# Patient Record
Sex: Female | Born: 1950 | Race: White | Hispanic: No | Marital: Single | Smoking: Never smoker
Health system: Southern US, Community
[De-identification: ages and names within clinical notes are randomized; demographics above are authoritative.]

## PROBLEM LIST (undated history)

## (undated) ENCOUNTER — Ambulatory Visit: Admission: EM | Payer: PPO | Source: Home / Self Care

## (undated) DIAGNOSIS — E785 Hyperlipidemia, unspecified: Secondary | ICD-10-CM

## (undated) DIAGNOSIS — N200 Calculus of kidney: Secondary | ICD-10-CM

## (undated) DIAGNOSIS — R7303 Prediabetes: Secondary | ICD-10-CM

## (undated) DIAGNOSIS — I10 Essential (primary) hypertension: Secondary | ICD-10-CM

## (undated) HISTORY — DX: Hyperlipidemia, unspecified: E78.5

## (undated) HISTORY — DX: Essential (primary) hypertension: I10

## (undated) HISTORY — DX: Prediabetes: R73.03

## (undated) HISTORY — PX: LITHOTRIPSY: SUR834

## (undated) HISTORY — PX: TUBAL LIGATION: SHX77

---

## 2000-10-17 ENCOUNTER — Emergency Department (HOSPITAL_COMMUNITY): Admission: EM | Admit: 2000-10-17 | Discharge: 2000-10-17 | Payer: Self-pay | Admitting: Emergency Medicine

## 2000-10-17 ENCOUNTER — Encounter: Payer: Self-pay | Admitting: Emergency Medicine

## 2000-10-20 ENCOUNTER — Encounter: Payer: Self-pay | Admitting: Urology

## 2000-10-20 ENCOUNTER — Ambulatory Visit (HOSPITAL_COMMUNITY): Admission: RE | Admit: 2000-10-20 | Discharge: 2000-10-20 | Payer: Self-pay | Admitting: Urology

## 2001-10-02 ENCOUNTER — Encounter: Payer: Self-pay | Admitting: Urology

## 2001-10-02 ENCOUNTER — Ambulatory Visit (HOSPITAL_COMMUNITY): Admission: RE | Admit: 2001-10-02 | Discharge: 2001-10-02 | Payer: Self-pay | Admitting: Urology

## 2002-03-20 ENCOUNTER — Observation Stay (HOSPITAL_COMMUNITY): Admission: RE | Admit: 2002-03-20 | Discharge: 2002-03-21 | Payer: Self-pay | Admitting: General Surgery

## 2005-02-04 HISTORY — PX: COLONOSCOPY: SHX174

## 2005-02-04 HISTORY — PX: ESOPHAGOGASTRODUODENOSCOPY: SHX1529

## 2012-08-25 ENCOUNTER — Other Ambulatory Visit: Payer: Self-pay | Admitting: Family Medicine

## 2012-08-25 LAB — BASIC METABOLIC PANEL
BUN: 16 mg/dL (ref 6–23)
CO2: 28 mEq/L (ref 19–32)
Calcium: 9.4 mg/dL (ref 8.4–10.5)
Chloride: 108 mEq/L (ref 96–112)
Creat: 0.83 mg/dL (ref 0.50–1.10)
Glucose, Bld: 99 mg/dL (ref 70–99)
Potassium: 4.1 mEq/L (ref 3.5–5.3)
Sodium: 143 mEq/L (ref 135–145)

## 2012-09-20 ENCOUNTER — Encounter: Payer: Self-pay | Admitting: *Deleted

## 2012-09-21 ENCOUNTER — Encounter: Payer: Self-pay | Admitting: *Deleted

## 2012-09-26 ENCOUNTER — Ambulatory Visit (INDEPENDENT_AMBULATORY_CARE_PROVIDER_SITE_OTHER): Payer: PRIVATE HEALTH INSURANCE | Admitting: Family Medicine

## 2012-09-26 ENCOUNTER — Encounter: Payer: Self-pay | Admitting: Family Medicine

## 2012-09-26 VITALS — BP 146/92 | HR 70 | Wt 204.2 lb

## 2012-09-26 DIAGNOSIS — R7309 Other abnormal glucose: Secondary | ICD-10-CM

## 2012-09-26 DIAGNOSIS — R7303 Prediabetes: Secondary | ICD-10-CM | POA: Insufficient documentation

## 2012-09-26 DIAGNOSIS — I10 Essential (primary) hypertension: Secondary | ICD-10-CM | POA: Insufficient documentation

## 2012-09-26 DIAGNOSIS — E785 Hyperlipidemia, unspecified: Secondary | ICD-10-CM | POA: Insufficient documentation

## 2012-09-26 MED ORDER — LOSARTAN POTASSIUM 50 MG PO TABS: 50.0000 mg | ORAL_TABLET | Freq: Every day | ORAL | Status: DC

## 2012-09-26 NOTE — Patient Instructions (Addendum)
Minimize starches and sugars Walking on your days off Do labs in the fall.  Follow up later in the fall   Diabetes Meal Planning Guide The diabetes meal planning guide is a tool to help you plan your meals and snacks. It is important for people with diabetes to manage their blood glucose (sugar) levels. Choosing the right foods and the right amounts throughout your day will help control your blood glucose. Eating right can even help you improve your blood pressure and reach or maintain a healthy weight. CARBOHYDRATE COUNTING MADE EASY When you eat carbohydrates, they turn to sugar. This raises your blood glucose level. Counting carbohydrates can help you control this level so you feel better. When you plan your meals by counting carbohydrates, you can have more flexibility in what you eat and balance your medicine with your food intake. Carbohydrate counting simply means adding up the total amount of carbohydrate grams in your meals and snacks. Try to eat about the same amount at each meal. Foods with carbohydrates are listed below. Each portion below is 1 carbohydrate serving or 15 grams of carbohydrates. Ask your dietician how many grams of carbohydrates you should eat at each meal or snack. Grains and Starches  1 slice bread.   English muffin or hotdog/hamburger bun.   cup cold cereal (unsweetened).   cup cooked pasta or rice.   cup starchy vegetables (corn, potatoes, peas, beans, winter squash).  1 tortilla (6 inches).   bagel.  1 waffle or pancake (size of a CD).   cup cooked cereal.  4 to 6 small crackers. *Whole grain is recommended. Fruit  1 cup fresh unsweetened berries, melon, papaya, pineapple.  1 small fresh fruit.   banana or mango.   cup fruit juice (4 oz unsweetened).   cup canned fruit in natural juice or water.  2 tbs dried fruit.  12 to 15 grapes or cherries. Milk and Yogurt  1 cup fat-free or 1% milk.  1 cup soy milk.  6 oz light yogurt  with sugar-free sweetener.  6 oz low-fat soy yogurt.  6 oz plain yogurt. Vegetables  1 cup raw or  cup cooked is counted as 0 carbohydrates or a "free" food.  If you eat 3 or more servings at 1 meal, count them as 1 carbohydrate serving. Other Carbohydrates   oz chips or pretzels.   cup ice cream or frozen yogurt.   cup sherbet or sorbet.  2 inch square cake, no frosting.  1 tbs honey, sugar, jam, jelly, or syrup.  2 small cookies.  3 squares of graham crackers.  3 cups popcorn.  6 crackers.  1 cup broth-based soup.  Count 1 cup casserole or other mixed foods as 2 carbohydrate servings.  Foods with less than 20 calories in a serving may be counted as 0 carbohydrates or a "free" food. You may want to purchase a book or computer software that lists the carbohydrate gram counts of different foods. In addition, the nutrition facts panel on the labels of the foods you eat are a good source of this information. The label will tell you how big the serving size is and the total number of carbohydrate grams you will be eating per serving. Divide this number by 15 to obtain the number of carbohydrate servings in a portion. Remember, 1 carbohydrate serving equals 15 grams of carbohydrate. SERVING SIZES Measuring foods and serving sizes helps you make sure you are getting the right amount of food. The list below  tells how big or small some common serving sizes are.  1 oz.........4 stacked dice.  3 oz........Marland KitchenDeck of cards.  1 tsp.......Marland KitchenTip of little finger.  1 tbs......Marland KitchenMarland KitchenThumb.  2 tbs.......Marland KitchenGolf ball.   cup......Marland KitchenHalf of a fist.  1 cup.......Marland KitchenA fist. SAMPLE DIABETES MEAL PLAN Below is a sample meal plan that includes foods from the grain and starches, dairy, vegetable, fruit, and meat groups. A dietician can individualize a meal plan to fit your calorie needs and tell you the number of servings needed from each food group. However, controlling the total amount of  carbohydrates in your meal or snack is more important than making sure you include all of the food groups at every meal. You may interchange carbohydrate containing foods (dairy, starches, and fruits). The meal plan below is an example of a 2000 calorie diet using carbohydrate counting. This meal plan has 17 carbohydrate servings. Breakfast  1 cup oatmeal (2 carb servings).   cup light yogurt (1 carb serving).  1 cup blueberries (1 carb serving).   cup almonds. Snack  1 large apple (2 carb servings).  1 low-fat string cheese stick. Lunch  Chicken breast salad.  1 cup spinach.   cup chopped tomatoes.  2 oz chicken breast, sliced.  2 tbs low-fat [place] dressing.  12 whole-wheat crackers (2 carb servings).  12 to 15 grapes (1 carb serving).  1 cup low-fat milk (1 carb serving). Snack  1 cup carrots.   cup hummus (1 carb serving). Dinner  3 oz broiled salmon.  1 cup brown rice (3 carb servings). Snack  1  cups steamed broccoli (1 carb serving) drizzled with 1 tsp olive oil and lemon juice.  1 cup light pudding (2 carb servings). DIABETES MEAL PLANNING WORKSHEET Your dietician can use this worksheet to help you decide how many servings of foods and what types of foods are right for you.  BREAKFAST Food Group and Servings / Carb Servings Grain/Starches __________________________________ Dairy __________________________________________ Vegetable ______________________________________ Fruit ___________________________________________ Meat __________________________________________ Fat ____________________________________________ LUNCH Food Group and Servings / Carb Servings Grain/Starches ___________________________________ Dairy ___________________________________________ Fruit ____________________________________________ Meat ___________________________________________ Fat _____________________________________________ [name] Food Group and Servings /  Carb Servings Grain/Starches ___________________________________ Dairy ___________________________________________ Fruit ____________________________________________ Meat ___________________________________________ Fat _____________________________________________ SNACKS Food Group and Servings / Carb Servings Grain/Starches ___________________________________ Dairy ___________________________________________ Vegetable _______________________________________ Fruit ____________________________________________ Meat ___________________________________________ Fat _____________________________________________ DAILY TOTALS Starches _________________________ Vegetable ________________________ Fruit ____________________________ Dairy ____________________________ Meat ____________________________ Fat ______________________________ Document Released: 02/17/2005 Document Revised: 08/15/2011 Document Reviewed: 12/29/2008 [employer] Patient Information 2013 [place], [place].

## 2012-09-26 NOTE — Progress Notes (Signed)
  Subjective:    Patient ID: Patricia Fuller, female    DOB: 1951/04/09, 62 y.o.   MRN: [id number]  Hypertension This is a chronic problem. The current episode started more than 1 year ago. The problem is unchanged. The problem is controlled. Pertinent negatives include no chest pain, headaches, malaise/fatigue, peripheral edema, PND or shortness of breath. There are no associated agents to hypertension. Risk factors for coronary artery disease include dyslipidemia, family history and sedentary lifestyle. Past treatments include angiotensin blockers. The current treatment provides significant improvement. There are no compliance problems.  There is no history of angina.      Review of Systems  Constitutional: Negative for malaise/fatigue.  Respiratory: Negative for shortness of breath.   Cardiovascular: Negative for chest pain and PND.  Neurological: Negative for headaches.       Objective:   Physical Exam  Her lungs are clear hearts regular pulse normal extremities no edema vital signs are noted. Blood pressure retaken was normal.      Assessment & Plan:  HTN-refills given labs recommended patient followup 6 months time later this fall sooner if any problems.

## 2013-04-05 ENCOUNTER — Encounter: Payer: Self-pay | Admitting: Nurse Practitioner

## 2013-04-05 ENCOUNTER — Ambulatory Visit (INDEPENDENT_AMBULATORY_CARE_PROVIDER_SITE_OTHER): Payer: PRIVATE HEALTH INSURANCE | Admitting: Nurse Practitioner

## 2013-04-05 VITALS — BP 138/80 | Temp 98.4°F | Ht 66.0 in | Wt 207.8 lb

## 2013-04-05 DIAGNOSIS — L0201 Cutaneous abscess of face: Secondary | ICD-10-CM

## 2013-04-05 MED ORDER — CLINDAMYCIN HCL 300 MG PO CAPS: 300.0000 mg | ORAL_CAPSULE | Freq: Three times a day (TID) | ORAL | Status: DC

## 2013-04-06 ENCOUNTER — Encounter (HOSPITAL_COMMUNITY): Payer: Self-pay | Admitting: Emergency Medicine

## 2013-04-06 ENCOUNTER — Emergency Department (HOSPITAL_COMMUNITY)
Admission: EM | Admit: 2013-04-06 | Discharge: 2013-04-06 | Disposition: A | Discharge status: Home / Self Care | Payer: PRIVATE HEALTH INSURANCE | Attending: Emergency Medicine | Admitting: Emergency Medicine

## 2013-04-06 DIAGNOSIS — R599 Enlarged lymph nodes, unspecified: Secondary | ICD-10-CM

## 2013-04-06 DIAGNOSIS — I10 Essential (primary) hypertension: Secondary | ICD-10-CM | POA: Insufficient documentation

## 2013-04-06 DIAGNOSIS — Z792 Long term (current) use of antibiotics: Secondary | ICD-10-CM | POA: Insufficient documentation

## 2013-04-06 DIAGNOSIS — Z8639 Personal history of other endocrine, nutritional and metabolic disease: Secondary | ICD-10-CM | POA: Insufficient documentation

## 2013-04-06 DIAGNOSIS — Z862 Personal history of diseases of the blood and blood-forming organs and certain disorders involving the immune mechanism: Secondary | ICD-10-CM | POA: Insufficient documentation

## 2013-04-06 DIAGNOSIS — L03211 Cellulitis of face: Secondary | ICD-10-CM | POA: Insufficient documentation

## 2013-04-06 DIAGNOSIS — Z79899 Other long term (current) drug therapy: Secondary | ICD-10-CM | POA: Insufficient documentation

## 2013-04-06 DIAGNOSIS — L0201 Cutaneous abscess of face: Secondary | ICD-10-CM

## 2013-04-06 MED ORDER — POVIDONE-IODINE 10 % EX SOLN
CUTANEOUS | Status: AC
  Administered 2013-04-06: 11:00:00
  Filled 2013-04-06: qty 118

## 2013-04-06 MED ORDER — LIDOCAINE HCL (PF) 1 % IJ SOLN
5.0000 mL | Freq: Once | INTRAMUSCULAR | Status: AC
  Administered 2013-04-06: 5 mL
  Filled 2013-04-06: qty 5

## 2013-04-06 NOTE — ED Notes (Signed)
?  abcess to left chin area for 5 days. Is on antibiotic, woke today w/ face more swollen and draining.

## 2013-04-06 NOTE — ED Provider Notes (Signed)
CSN: [id number]     Arrival date & time 04/06/13  0909 History   First MD Initiated Contact with Patient 04/06/13 0920     Chief Complaint  Patient presents with  . Wound Check   (Consider location/radiation/quality/duration/timing/severity/associated sxs/prior Treatment) Patient is a 62 y.o. female presenting with wound check. The history is provided by the patient.  Wound Check This is a new problem. The current episode started in the past 7 days. The problem has been gradually worsening. Associated symptoms include swollen glands. Pertinent negatives include no chills, fever, headaches, nausea or vomiting. Exacerbated by: pressure. She has tried acetaminophen (antibiotics) for the symptoms.   Patricia Fuller is a.62 female who presents to the ED with facial pain that started 5 days ago. She started with a pimple area on her left cheek just under her eye. That area got better and then she noticed an area on the left side of her chin. She went to her doctor and they started Clindamycin. This morning she has increased swelling and pain. The area did have a small amount of drainage.  Past Medical History  Diagnosis Date  . Hypertension   . Hyperlipidemia   . Prediabetes    Past Surgical History  Procedure Laterality Date  . Esophagogastroduodenoscopy  02/2005  . Colonoscopy  02/2005    small polyp   Family History  Problem Relation Age of Onset  . Hypertension Mother   . Diabetes Mother   . Heart disease Mother    History  Substance Use Topics  . Smoking status: Never Smoker   . Smokeless tobacco: Not on file  . Alcohol Use: No   OB History   Grav Para Term Preterm Abortions TAB SAB Ect Mult Living                 Review of Systems  Constitutional: Negative for fever and chills.  HENT: Positive for facial swelling. Negative for dental problem.   Eyes: Negative for redness.  Respiratory: Negative for shortness of breath.   Gastrointestinal: Negative for nausea and vomiting.    Musculoskeletal: Negative for back pain.  Skin: Positive for wound.  Neurological: Negative for headaches.  Psychiatric/Behavioral: The patient is not nervous/anxious.     Allergies  Review of patient's allergies indicates no known allergies.  Home Medications   Current Outpatient Rx  Name  Route  Sig  Dispense  Refill  . clindamycin (CLEOCIN) 300 MG capsule   Oral   Take 1 capsule (300 mg total) by mouth 3 (three) times daily.   21 capsule   0   . losartan (COZAAR) 50 MG tablet   Oral   Take 1 tablet (50 mg total) by mouth daily.   90 tablet   2    BP 149/69  Pulse 84  Temp(Src) 97.9 F (36.6 C) (Oral)  Resp 20  Ht 5\' 6"  (1.676 m)  Wt 207 lb (93.895 kg)  BMI 33.43 kg/m2  SpO2 100% Physical Exam  Nursing note and vitals reviewed. Constitutional: She is oriented to person, place, and time. She appears well-developed and well-nourished. No distress.  HENT:  Head:    Tender, swollen fluctuant area palpated left side of face. Submandibular node enlarged.    Eyes: Conjunctivae and EOM are normal.  Neck: Neck supple.  Cardiovascular: Normal rate.   Pulmonary/Chest: Effort normal.  Musculoskeletal: Normal range of motion.  Neurological: She is alert and oriented to person, place, and time. No cranial nerve deficit.  Skin: Skin is  warm and dry.  Psychiatric: She has a normal mood and affect. Her behavior is normal.   Dr. Rosalia Hammers examined and we will try and I&D area near jaw. ED Course  Procedures  INCISION AND DRAINAGE Performed by: NEESE,HOPE Consent: Verbal consent obtained. Risks and benefits: risks, benefits and alternatives were discussed Type: abscess  Body area: left side of face/chin  Anesthesia: local infiltration  Local anesthetic: lidocaine 1% without epinephrine  Anesthetic total: 1 ml  Incision made with # 11 blade  Drainage: bloody  Drainage amount: samll  Patient tolerance: Patient tolerated the procedure well with no immediate  complications.    MDM: Dr. Rosalia Hammers in to examine the patient.   62 y.o. female with abscess to left side of face. She is to continue warm wet compresses, antibiotics and follow up with her PCP in 2 days. If she develops fever, persistent vomiting or other problems she will return immediately to the ED. Hospitalization for IV antibiotics is not indicated at this time.     Janne Napoleon, NP 04/06/13 1327

## 2013-04-07 NOTE — ED Provider Notes (Signed)
History/physical exam/procedure(s) were performed by non-physician practitioner and as supervising physician I was immediately available for consultation/collaboration. I have reviewed all notes and am in agreement with care and plan.   Cailynn Bodnar S Desia Saban, MD 04/07/13 1559 

## 2013-04-08 ENCOUNTER — Encounter: Payer: Self-pay | Admitting: Family Medicine

## 2013-04-08 ENCOUNTER — Encounter: Payer: Self-pay | Admitting: Nurse Practitioner

## 2013-04-08 ENCOUNTER — Ambulatory Visit (INDEPENDENT_AMBULATORY_CARE_PROVIDER_SITE_OTHER): Payer: PRIVATE HEALTH INSURANCE | Admitting: Family Medicine

## 2013-04-08 VITALS — BP 138/86 | Ht 66.0 in | Wt 208.0 lb

## 2013-04-08 DIAGNOSIS — L03211 Cellulitis of face: Secondary | ICD-10-CM

## 2013-04-08 DIAGNOSIS — L0201 Cutaneous abscess of face: Secondary | ICD-10-CM

## 2013-04-08 MED ORDER — DOXYCYCLINE HYCLATE 100 MG PO CAPS: 100.0000 mg | ORAL_CAPSULE | Freq: Two times a day (BID) | ORAL | Status: DC

## 2013-04-08 MED ORDER — HYDROCODONE-ACETAMINOPHEN 5-325 MG PO TABS: 1.0000 | ORAL_TABLET | Freq: Four times a day (QID) | ORAL | Status: DC | PRN

## 2013-04-08 NOTE — Progress Notes (Signed)
  Subjective:    Patient ID: Patricia Fuller, female    DOB: 1951/01/07, 62 y.o.   MRN: [id number]  HPI Patient arrives for follow up on abscess on face. Was seen 04/05/13 and given clindamycin and noticed the swelling so went to ER Sat and  They performed an I and D and told patient to stay on Clindamycin. Patient still has som swelling in face as well as some sores around the area. She states in the ER when they drain that they did not get much drainage from it. She was rather frustrated she relates having ongoing pain and discomfort. Denies any other issues currently  Review of Systems See above no fever    Objective:   Physical Exam She has a slight swollen area left side of face she also has erythematous area scabbing where the abscess was no fluctuance felt.       Assessment & Plan:  Cellulitis with previous abscess I don't feel this patient needs soft tissue CT scan at this point I would recommend adding doxycycline twice a day warm compresses frequently home from work today followup early next week

## 2013-04-08 NOTE — Progress Notes (Signed)
Subjective:  Presents with complaints of a knot on the left side of her throat for the past 2 weeks. Headache sore on the upper left part of her face around the maxillary area about 1-2 weeks ago which resolved. Began having a newly Dum[place] on the chin 5 days ago with another lesion just beneath his last night. No fever. Rare cough. No runny nose. No headache. No sore throat. Mild left ear pain.  Objective:   BP 138/80  Temp(Src) 98.4 F (36.9 C)  Ht 5\' 6"  (1.676 m)  Wt 207 lb 12.8 oz (94.257 kg)  BMI 33.56 kg/m2 NAD. Alert, oriented. TMs minimal clear effusion, no erythema. Pharynx minimally injected with clear PND. Neck supple with moderate soft slightly tender anterior cervical adenopathy. Moderate erythema noted on the left maxillary area, left cheek is slightly swollen and tender. A 2 x 2 centimeter flat abscess noted on the left chin area near her mouth with 2 small openings, no active drainage.. A smaller lesion is noted beneath the chin.  Assessment:Cellulitis and abscess of face  Plan: No indication for I&D at this time. Recommend warm moist compresses to her face. Meds ordered this encounter  Medications  . clindamycin (CLEOCIN) 300 MG capsule    Sig: Take 1 capsule (300 mg total) by mouth 3 (three) times daily.    Dispense:  21 capsule    Refill:  0    Order Specific Question:  Supervising Provider    Answer:  Merlyn Albert [[id number]]   Patient defers pain medication at this point. Anti-inflammatories as directed. Warning signs reviewed. Call back in 72 hours if no improvement, go to ER over the weekend if worse.

## 2013-04-11 ENCOUNTER — Ambulatory Visit (INDEPENDENT_AMBULATORY_CARE_PROVIDER_SITE_OTHER): Payer: PRIVATE HEALTH INSURANCE | Admitting: Family Medicine

## 2013-04-11 ENCOUNTER — Encounter: Payer: Self-pay | Admitting: Family Medicine

## 2013-04-11 VITALS — BP 120/82 | Ht 66.0 in | Wt 208.0 lb

## 2013-04-11 DIAGNOSIS — E785 Hyperlipidemia, unspecified: Secondary | ICD-10-CM

## 2013-04-11 DIAGNOSIS — I10 Essential (primary) hypertension: Secondary | ICD-10-CM

## 2013-04-11 NOTE — Progress Notes (Signed)
  Subjective:    Patient ID: Patricia Fuller, female    DOB: 02-17-1951, 62 y.o.   MRN: [id number]  HPI Patient arrives to follow up on cellulitis of the face. Patient relates she doesn't always eat the way she should she does not exercise she denies sweats chills nausea vomiting. Denies chest pressure or shortness of breath rectal bleeding  Review of Systems See above, she brings in lab results to go over today 20 minutes was spent on this.    Objective:   Physical Exam  Lungs are clear hearts regular pulse normal abdomen soft extremities no edema skin warm dry      Assessment & Plan:  #1 cellulitis of the face actually doing much better finish out the doxycycline  #2 slight hyperlipidemia watch diet closely  #3 obesity patient is encouraged to lose weight exercise on a regular basis  Patient was encouraged to get a mammogram in a female wellness exam she defers on this she is not due for colonoscopy currently

## 2013-04-11 NOTE — Patient Instructions (Signed)
Minimize sodas, watch portions, and exrecise more Remember to consider Mamogram and female check up with Sherie Don here at the office    Calorie Counting Diet A calorie counting diet requires you to eat the number of calories that are right for you in a day. Calories are the measurement of how much energy you get from the food you eat. Eating the right amount of calories is important for staying at a healthy weight. If you eat too many calories, your body will store them as fat and you may gain weight. If you eat too few calories, you may lose weight. Counting the number of calories you eat during a day will help you know if you are eating the right amount. A Registered Dietitian can determine how many calories you need in a day. The amount of calories needed varies from person to person. If your goal is to lose weight, you will need to eat fewer calories. Losing weight can benefit you if you are overweight or have health problems such as heart disease, high blood pressure, or diabetes. If your goal is to gain weight, you will need to eat more calories. Gaining weight may be necessary if you have a certain health problem that causes your body to need more energy. TIPS Whether you are increasing or decreasing the number of calories you eat during a day, it may be hard to get used to changes in what you eat and drink. The following are tips to help you keep track of the number of calories you eat.  Measure foods at home with measuring cups. This helps you know the amount of food and number of calories you are eating.  Restaurants often serve food in amounts that are larger than 1 serving. While eating out, estimate how many servings of a food you are given. For example, a serving of cooked rice is  cup or about the size of half of a fist. Knowing serving sizes will help you be aware of how much food you are eating at restaurants.  Ask for smaller portion sizes or child-size portions at  restaurants.  Plan to eat half of a meal at a restaurant. Take the rest home or share the other half with a friend.  Read the Nutrition Facts panel on food labels for calorie content and serving size. You can find out how many servings are in a package, the size of a serving, and the number of calories each serving has.  For example, a package might contain 3 cookies. The Nutrition Facts panel on that package says that 1 serving is 1 cookie. Below that, it will say there are 3 servings in the container. The calories section of the Nutrition Facts label says there are 90 calories. This means there are 90 calories in 1 cookie (1 serving). If you eat 1 cookie you have eaten 90 calories. If you eat all 3 cookies, you have eaten 270 calories (3 servings x 90 calories = 270 calories). The list below tells you how big or small some common portion sizes are.  1 oz.........4 stacked dice.  3 oz........Marland KitchenDeck of cards.  1 tsp.......Marland KitchenTip of little finger.  1 tbs......Marland KitchenMarland KitchenThumb.  2 tbs.......Marland KitchenGolf ball.   cup......Marland KitchenHalf of a fist.  1 cup.......Marland KitchenA fist. KEEP A FOOD LOG Write down every food item you eat, the amount you eat, and the number of calories in each food you eat during the day. At the end of the day, you can add up the total  number of calories you have eaten. It may help to keep a list like the one below. Find out the calorie information by reading the Nutrition Facts panel on food labels. Breakfast  Bran cereal (1 cup, 110 calories).  Fat-free milk ( cup, 45 calories). Snack  Apple (1 medium, 80 calories). Lunch  Spinach (1 cup, 20 calories).  Tomato ( medium, 20 calories).  Chicken breast strips (3 oz, 165 calories).  Shredded cheddar cheese ( cup, 110 calories).  Light [place] dressing (2 tbs, 60 calories).  Whole-wheat bread (1 slice, 80 calories).  Tub margarine (1 tsp, 35 calories).  Vegetable soup (1 cup, 160 calories). Dinner  Pork chop (3 oz, 190  calories).  Brown rice (1 cup, 215 calories).  Steamed broccoli ( cup, 20 calories).  Strawberries (1  cup, 65 calories).  Whipped cream (1 tbs, 50 calories). Daily Calorie Total: 1425 Document Released: 05/23/2005 Document Revised: 08/15/2011 Document Reviewed: 11/17/2006 [facility] Patient Information 2014 [place], [place]. Exercise to Lose Weight Exercise and a healthy diet may help you lose weight. Your doctor may suggest specific exercises. EXERCISE IDEAS AND TIPS  Choose low-cost things you enjoy doing, such as walking, bicycling, or exercising to workout videos.  Take stairs instead of the elevator.  Walk during your lunch break.  Park your car further away from work or school.  Go to a gym or an exercise class.  Start with 5 to 10 minutes of exercise each day. Build up to 30 minutes of exercise 4 to 6 days a week.  Wear shoes with good support and comfortable clothes.  Stretch before and after working out.  Work out until you breathe harder and your heart beats faster.  Drink extra water when you exercise.  Do not do so much that you hurt yourself, feel dizzy, or get very short of breath. Exercises that burn about 150 calories:  Running 1  miles in 15 minutes.  Playing volleyball for 45 to 60 minutes.  Washing and waxing a car for 45 to 60 minutes.  Playing touch football for 45 minutes.  Walking 1  miles in 35 minutes.  Pushing a stroller 1  miles in 30 minutes.  Playing basketball for 30 minutes.  Raking leaves for 30 minutes.  Bicycling 5 miles in 30 minutes.  Walking 2 miles in 30 minutes.  Dancing for 30 minutes.  Shoveling snow for 15 minutes.  Swimming laps for 20 minutes.  Walking up stairs for 15 minutes.  Bicycling 4 miles in 15 minutes.  Gardening for 30 to 45 minutes.  Jumping rope for 15 minutes.  Washing windows or floors for 45 to 60 minutes. Document Released: 06/25/2010 Document Revised: 08/15/2011 Document Reviewed:  06/25/2010 [facility] Patient Information 2014 [place], [place].

## 2013-04-16 ENCOUNTER — Telehealth: Payer: Self-pay | Admitting: Family Medicine

## 2013-04-16 MED ORDER — DOXYCYCLINE HYCLATE 100 MG PO CAPS: 100.0000 mg | ORAL_CAPSULE | Freq: Two times a day (BID) | ORAL | Status: DC

## 2013-04-16 MED ORDER — LOSARTAN POTASSIUM 50 MG PO TABS: 50.0000 mg | ORAL_TABLET | Freq: Every day | ORAL | Status: DC

## 2013-04-16 NOTE — Telephone Encounter (Signed)
Doxy, refill times 1.  Cozaar refill times 6

## 2013-04-16 NOTE — Telephone Encounter (Signed)
Medication was sent to pharmacy. Patient was notified.  

## 2013-04-16 NOTE — Telephone Encounter (Signed)
Needs a refill on doxycycline (VIBRAMYCIN) 100 MG capsule due to skin is not cleared up.  States Dr. Informed her to call if she needs another prescription for this.  Also needs losartan (COZAAR) 50 MG tablet phoned in to the pharmacy  [facility] in [place]

## 2013-11-21 ENCOUNTER — Ambulatory Visit (INDEPENDENT_AMBULATORY_CARE_PROVIDER_SITE_OTHER): Payer: PRIVATE HEALTH INSURANCE | Admitting: Family Medicine

## 2013-11-21 ENCOUNTER — Encounter: Payer: Self-pay | Admitting: Family Medicine

## 2013-11-21 VITALS — BP 122/80 | Ht 66.0 in | Wt 204.2 lb

## 2013-11-21 DIAGNOSIS — E785 Hyperlipidemia, unspecified: Secondary | ICD-10-CM

## 2013-11-21 DIAGNOSIS — I10 Essential (primary) hypertension: Secondary | ICD-10-CM

## 2013-11-21 DIAGNOSIS — R7309 Other abnormal glucose: Secondary | ICD-10-CM

## 2013-11-21 DIAGNOSIS — R7303 Prediabetes: Secondary | ICD-10-CM

## 2013-11-21 MED ORDER — LOSARTAN POTASSIUM 50 MG PO TABS: 50.0000 mg | ORAL_TABLET | Freq: Every day | ORAL | Status: DC

## 2013-11-21 NOTE — Progress Notes (Signed)
   Subjective:    Patient ID: Patricia Fuller, female    DOB: [birth year], 63 y.o.   MRN: [id number]  Hypertension This is a chronic problem. The current episode started more than 1 year ago. The problem has been gradually improving since onset. The problem is controlled. Pertinent negatives include no chest pain. There are no associated agents to hypertension. There are no known risk factors for coronary artery disease. Treatments tried: losartan. The current treatment provides significant improvement. There are no compliance problems.     Patient states she has no concerns at this time.   Review of Systems  Constitutional: Negative for activity change, appetite change and fatigue.  HENT: Negative for congestion.   Respiratory: Negative for cough.   Cardiovascular: Negative for chest pain.  Gastrointestinal: Negative for abdominal pain.  Endocrine: Negative for polydipsia and polyphagia.  Genitourinary: Negative for frequency.  Neurological: Negative for weakness.  Psychiatric/Behavioral: Negative for confusion.       Objective:   Physical Exam  Vitals reviewed. Constitutional: She appears well-nourished. No distress.  Cardiovascular: Normal rate, regular rhythm and normal heart sounds.   No murmur heard. Pulmonary/Chest: Effort normal and breath sounds normal. No respiratory distress.  Musculoskeletal: She exhibits no edema.  Lymphadenopathy:    She has no cervical adenopathy.  Neurological: She is alert. She exhibits normal muscle tone.  Psychiatric: Her behavior is normal.          Assessment & Plan:  HTN good control continue current measures check metabolic 7 watch diet increase exercise followup regular health checkups

## 2013-11-26 ENCOUNTER — Emergency Department (HOSPITAL_COMMUNITY)
Admission: EM | Admit: 2013-11-26 | Discharge: 2013-11-26 | Disposition: A | Discharge status: Home / Self Care | Payer: PRIVATE HEALTH INSURANCE | Attending: Emergency Medicine | Admitting: Emergency Medicine

## 2013-11-26 ENCOUNTER — Encounter (HOSPITAL_COMMUNITY): Payer: Self-pay | Admitting: Emergency Medicine

## 2013-11-26 ENCOUNTER — Emergency Department (HOSPITAL_COMMUNITY): Payer: PRIVATE HEALTH INSURANCE

## 2013-11-26 DIAGNOSIS — R109 Unspecified abdominal pain: Secondary | ICD-10-CM | POA: Insufficient documentation

## 2013-11-26 DIAGNOSIS — Z79899 Other long term (current) drug therapy: Secondary | ICD-10-CM | POA: Insufficient documentation

## 2013-11-26 DIAGNOSIS — Z9889 Other specified postprocedural states: Secondary | ICD-10-CM | POA: Insufficient documentation

## 2013-11-26 DIAGNOSIS — Z862 Personal history of diseases of the blood and blood-forming organs and certain disorders involving the immune mechanism: Secondary | ICD-10-CM | POA: Insufficient documentation

## 2013-11-26 DIAGNOSIS — R590 Localized enlarged lymph nodes: Secondary | ICD-10-CM

## 2013-11-26 DIAGNOSIS — I10 Essential (primary) hypertension: Secondary | ICD-10-CM | POA: Insufficient documentation

## 2013-11-26 DIAGNOSIS — R599 Enlarged lymph nodes, unspecified: Secondary | ICD-10-CM | POA: Insufficient documentation

## 2013-11-26 DIAGNOSIS — Z8639 Personal history of other endocrine, nutritional and metabolic disease: Secondary | ICD-10-CM | POA: Insufficient documentation

## 2013-11-26 DIAGNOSIS — Z87442 Personal history of urinary calculi: Secondary | ICD-10-CM | POA: Insufficient documentation

## 2013-11-26 HISTORY — DX: Calculus of kidney: N20.0

## 2013-11-26 LAB — BASIC METABOLIC PANEL
BUN: 19 mg/dL (ref 6–23)
CO2: 27 mEq/L (ref 19–32)
Calcium: 10.4 mg/dL (ref 8.4–10.5)
Chloride: 103 mEq/L (ref 96–112)
Creatinine, Ser: 0.88 mg/dL (ref 0.50–1.10)
GFR calc Af Amer: 81 mL/min — ABNORMAL LOW (ref >90)
GFR calc non Af Amer: 70 mL/min — ABNORMAL LOW (ref >90)
Glucose, Bld: 91 mg/dL (ref 70–99)
Potassium: 4.2 mEq/L (ref 3.7–5.3)
Sodium: 141 mEq/L (ref 137–147)

## 2013-11-26 LAB — CBC WITH DIFFERENTIAL/PLATELET
Basophils Absolute: 0 10*3/uL (ref 0.0–0.1)
Basophils Relative: 0 % (ref 0–1)
Eosinophils Absolute: 0.2 10*3/uL (ref 0.0–0.7)
Eosinophils Relative: 3 % (ref 0–5)
HCT: 41.1 % (ref 36.0–46.0)
Hemoglobin: 13.7 g/dL (ref 12.0–15.0)
Lymphocytes Relative: 39 % (ref 12–46)
Lymphs Abs: 2.7 10*3/uL (ref 0.7–4.0)
MCH: 29.6 pg (ref 26.0–34.0)
MCHC: 33.3 g/dL (ref 30.0–36.0)
MCV: 88.8 fL (ref 78.0–100.0)
Monocytes Absolute: 0.6 10*3/uL (ref 0.1–1.0)
Monocytes Relative: 9 % (ref 3–12)
Neutro Abs: 3.4 10*3/uL (ref 1.7–7.7)
Neutrophils Relative %: 49 % (ref 43–77)
Platelets: 214 10*3/uL (ref 150–400)
RBC: 4.63 MIL/uL (ref 3.87–5.11)
RDW: 13.8 % (ref 11.5–15.5)
WBC: 6.9 10*3/uL (ref 4.0–10.5)

## 2013-11-26 LAB — URINALYSIS, ROUTINE W REFLEX MICROSCOPIC
Bilirubin Urine: NEGATIVE
Glucose, UA: NEGATIVE mg/dL
Hgb urine dipstick: NEGATIVE
Ketones, ur: NEGATIVE mg/dL
Leukocytes, UA: NEGATIVE
Nitrite: NEGATIVE
Protein, ur: NEGATIVE mg/dL
Specific Gravity, Urine: >1.03 — ABNORMAL HIGH (ref 1.005–1.030)
Urobilinogen, UA: 0.2 mg/dL (ref 0.0–1.0)
pH: 5.5 (ref 5.0–8.0)

## 2013-11-26 MED ORDER — OXYCODONE-ACETAMINOPHEN 5-325 MG PO TABS
2.0000 | ORAL_TABLET | Freq: Once | ORAL | Status: AC
  Administered 2013-11-26: 2 via ORAL
  Filled 2013-11-26: qty 2

## 2013-11-26 MED ORDER — OXYCODONE-ACETAMINOPHEN 5-325 MG PO TABS: 1.0000 | ORAL_TABLET | ORAL | Status: DC | PRN

## 2013-11-26 MED ORDER — IBUPROFEN 400 MG PO TABS
400.0000 mg | ORAL_TABLET | Freq: Once | ORAL | Status: AC
  Administered 2013-11-26: 400 mg via ORAL
  Filled 2013-11-26: qty 1

## 2013-11-26 NOTE — ED Notes (Signed)
C/o pain "catch" intermittently in left flank for 3 days. No nausea or vomiting.

## 2013-11-26 NOTE — Discharge Instructions (Signed)
°Lymphadenopathy °Lymphadenopathy means "disease of the lymph glands." But the term is usually used to describe swollen or enlarged lymph glands, also called lymph nodes. These are the bean-shaped organs found in many locations including the neck, underarm, and groin. Lymph glands are part of the immune system, which fights infections in your body. Lymphadenopathy can occur in just one area of the body, such as the neck, or it can be generalized, with lymph node enlargement in several areas. The nodes found in the neck are the most common sites of lymphadenopathy. °CAUSES  °When your immune system responds to germs (such as viruses or bacteria ), infection-fighting cells and fluid build up. This causes the glands to grow in size. This is usually not something to worry about. Sometimes, the glands themselves can become infected and inflamed. This is called lymphadenitis. °Enlarged lymph nodes can be caused by many diseases: °· Bacterial disease, such as strep throat or a skin infection. °· Viral disease, such as a common cold. °· Other germs, such as lyme disease, tuberculosis, or sexually transmitted diseases. °· Cancers, such as lymphoma (cancer of the lymphatic system) or leukemia (cancer of the white blood cells). °· Inflammatory diseases such as lupus or rheumatoid arthritis. °· Reactions to medications. °Many of the diseases above are rare, but important. This is why you should see your caregiver if you have lymphadenopathy. °SYMPTOMS  °· Swollen, enlarged lumps in the neck, back of the head or other locations. °· Tenderness. °· Warmth or redness of the skin over the lymph nodes. °· Fever. °DIAGNOSIS  °Enlarged lymph nodes are often near the source of infection. They can help healthcare providers diagnose your illness. For instance:  °· Swollen lymph nodes around the jaw might be caused by an infection in the mouth. °· Enlarged glands in the neck often signal a throat infection. °· Lymph nodes that are swollen  in more than one area often indicate an illness caused by a virus. °Your caregiver most likely will know what is causing your lymphadenopathy after listening to your history and examining you. Blood tests, x-rays or other tests may be needed. If the cause of the enlarged lymph node cannot be found, and it does not go away by itself, then a biopsy may be needed. Your caregiver will discuss this with you. °TREATMENT  °Treatment for your enlarged lymph nodes will depend on the cause. Many times the nodes will shrink to normal size by themselves, with no treatment. Antibiotics or other medicines may be needed for infection. Only take over-the-counter or prescription medicines for pain, discomfort or fever as directed by your caregiver. °HOME CARE INSTRUCTIONS  °Swollen lymph glands usually return to normal when the underlying medical condition goes away. If they persist, contact your health-care provider. He/she might prescribe antibiotics or other treatments, depending on the diagnosis. Take any medications exactly as prescribed. Keep any follow-up appointments made to check on the condition of your enlarged nodes.  °SEEK MEDICAL CARE IF:  °· Swelling lasts for more than two weeks. °· You have symptoms such as weight loss, night sweats, fatigue or fever that does not go away. °· The lymph nodes are hard, seem fixed to the skin or are growing rapidly. °· Skin over the lymph nodes is red and inflamed. This could mean there is an infection. °SEEK IMMEDIATE MEDICAL CARE IF:  °· Fluid starts leaking from the area of the enlarged lymph node. °· You develop a fever of 102° F (38.9° C) or greater. °· Severe   pain develops (not necessarily at the site of a large lymph node). °· You develop chest pain or shortness of breath. °· You develop worsening abdominal pain. °MAKE SURE YOU:  °· Understand these instructions. °· Will watch your condition. °· Will get help right away if you are not doing well or get worse. °Document  Released: 03/01/2008 Document Revised: 08/15/2011 Document Reviewed: 03/01/2008 °[employer]® Patient Information ©2015 [employer]. This information is not intended to replace advice given to you by your health care provider. Make sure you discuss any questions you have with your health care provider. ° ° ° °

## 2013-11-26 NOTE — ED Provider Notes (Signed)
CSN: [id number]     Arrival date & time 11/26/13  1904 History   First MD Initiated Contact with Patient 11/26/13 1948     Chief Complaint  Patient presents with  . Flank Pain     (Consider location/radiation/quality/duration/timing/severity/associated sxs/prior Treatment) HPI  63 year old female with left flank pain. Gradual onset about 3 days ago. Waxes and wanes. Feels like there is something "catching there." No appreciable exacerbating relieving factors. Denies or vomiting. No urinary complaints. No diarrhea. She past history of kidney stones. She is unsure if current symptoms feel similar to that or not. No fever or chills. No past abdominal surgical hx.   Past Medical History  Diagnosis Date  . Hypertension   . Hyperlipidemia   . Prediabetes   . Renal calculi    Past Surgical History  Procedure Laterality Date  . Esophagogastroduodenoscopy  02/2005  . Colonoscopy  02/2005    small polyp   Family History  Problem Relation Age of Onset  . Hypertension Mother   . Diabetes Mother   . Heart disease Mother    History  Substance Use Topics  . Smoking status: Never Smoker   . Smokeless tobacco: Not on file  . Alcohol Use: No   OB History   Grav Para Term Preterm Abortions TAB SAB Ect Mult Living                 Review of Systems  All systems reviewed and negative, other than as noted in HPI.   Allergies  Review of patient's allergies indicates no known allergies.  Home Medications   Prior to Admission medications   Medication Sig Start Date End Date Taking? Authorizing Provider  aspirin buffered (BUFFERIN) 325 MG TABS tablet Take 325 mg by mouth daily as needed (for pain).   Yes Historical Provider, MD  ibuprofen (ADVIL,MOTRIN) 200 MG tablet Take 200 mg by mouth every 6 (six) hours as needed.   Yes Historical Provider, MD  losartan (COZAAR) 50 MG tablet Take 1 tablet (50 mg total) by mouth daily. 11/21/13  Yes Kathyrn Drown, MD   BP 152/87  Pulse 65   Temp(Src) 97.9 F (36.6 C) (Oral)  Resp 18  Ht 5\' 6"  (1.676 m)  Wt 204 lb (92.534 kg)  BMI 32.94 kg/m2  SpO2 97% Physical Exam  Nursing note and vitals reviewed. Constitutional: She is oriented to person, place, and time. She appears well-developed and well-nourished. No distress.  HENT:  Head: Normocephalic and atraumatic.  Eyes: Conjunctivae are normal. Right eye exhibits no discharge. Left eye exhibits no discharge.  Neck: Neck supple.  Cardiovascular: Normal rate, regular rhythm and normal heart sounds.  Exam reveals no gallop and no friction rub.   No murmur heard. Pulmonary/Chest: Effort normal and breath sounds normal. No respiratory distress.  Abdominal: Soft. She exhibits no distension. There is no tenderness.  Genitourinary:  No CVA tenderness  Musculoskeletal: She exhibits no edema and no tenderness.  Neurological: She is alert and oriented to person, place, and time.  Skin: Skin is warm and dry.  Psychiatric: She has a normal mood and affect. Her behavior is normal. Thought content normal.    ED Course  Procedures (including critical care time) Labs Review Labs Reviewed  URINALYSIS, ROUTINE W REFLEX MICROSCOPIC - Abnormal; Notable for the following:    Specific Gravity, Urine >1.030 (*)    All other components within normal limits  BASIC METABOLIC PANEL - Abnormal; Notable for the following:    GFR calc  non Af Amer 70 (*)    GFR calc Af Amer 81 (*)    All other components within normal limits  CBC WITH DIFFERENTIAL    Imaging Review Ct Abdomen Pelvis Wo Contrast  11/26/2013   CLINICAL DATA:  Left flank pain over the past week, worsening over the past 2 days. Remote history of renal calculi.  EXAM: CT ABDOMEN AND PELVIS WITHOUT CONTRAST  TECHNIQUE: Multidetector CT imaging of the abdomen and pelvis was performed following the standard protocol without IV contrast.  COMPARISON:  Report from 10/02/2001  FINDINGS: Small hiatal hernia. Cholelithiasis with a 2.2 cm  gallstone identified. No appreciable biliary dilatation.  The noncontrast CT appearance of the liver, spleen, pancreas, and adrenal glands is within normal limits.  Two calcifications in the immediate vicinity of the right distal ureter appear to be outside of the ureter on images 67-66 of series 4. This is supported by the fact that there is no ureteral dilatation or hydronephrosis. Accordingly these are considered vascular calcifications.  No renal calculi. No pathologic upper abdominal adenopathy is observed. A left common iliac lymph node measures 1.4 cm in short axis on image 46 of series 2, abnormally prominent. A right common iliac lymph node measures 0.9 cm in short axis. Right internal iliac node 1.0 cm, image 59 series 2. Right external iliac node 1.2 cm, image 64 series 2. Right pelvic sidewall/external iliac node 1.6 cm, image 68 of series 2. Left external iliac node 1.2 cm, image 64 series 2. Additional mildly enlarged iliac nodes are present bilaterally.  Appendix unremarkable. Prominent stool throughout the colon favors constipation. Uterine and adnexal contours unremarkable. No dilated bowel. Small umbilical hernia contains adipose tissue.  IMPRESSION: 1. Bilateral pelvic adenopathy could be reactive but raises concern for neoplasm. No obvious uterine or ovarian contour abnormality. Lymphoma is not excluded. Workup might include biopsy, nuclear medicine PET-CT, or followup CT in order to ensure resolution if there is a high clinical suspicion of these are simply reactive lymph nodes. 2. Cholelithiasis. 3. Small hiatal hernia. 4. No renal calculi observed.   Electronically Signed   By: Sherryl Barters M.D.   On: 11/26/2013 21:05     EKG Interpretation None      MDM   Final diagnoses:  Flank pain  Intra-abdominal lymphadenopathy    [age]-year-old female with left flank pain. Abdominal exam is benign. Urinalysis is unremarkable. CT abdomen pelvis shows multiple enlarged pelvic lymph  nodes. This may or may not be causing patient's symptoms. Cholelithiasis, but this does not correlate to side of pain. Discussed CT findings with patient. Understandably focusing that may potentially be malignancy. Stressed that this is possibility amongst other things and that right now simply need more information. Has established care with Dr Wolfgang Phoenix. I feel she is appropriate for outpt FU. Will provide prescription for pain medication. Further w/u at his discretion.     Virgel Manifold, MD 12/01/13 [phone]

## 2013-11-27 ENCOUNTER — Ambulatory Visit (INDEPENDENT_AMBULATORY_CARE_PROVIDER_SITE_OTHER): Payer: PRIVATE HEALTH INSURANCE | Admitting: Family Medicine

## 2013-11-27 ENCOUNTER — Encounter: Payer: Self-pay | Admitting: Family Medicine

## 2013-11-27 VITALS — BP 122/80 | Temp 98.3°F | Ht 66.0 in | Wt 206.0 lb

## 2013-11-27 DIAGNOSIS — M545 Low back pain, unspecified: Secondary | ICD-10-CM

## 2013-11-27 DIAGNOSIS — R599 Enlarged lymph nodes, unspecified: Secondary | ICD-10-CM

## 2013-11-27 DIAGNOSIS — R590 Localized enlarged lymph nodes: Secondary | ICD-10-CM

## 2013-11-27 NOTE — Progress Notes (Signed)
   Subjective:    Patient ID: Patricia Fuller, female    DOB: 1950/12/05, 63 y.o.   MRN: [id number]  Flank Pain This is a new problem. The current episode started in the past 7 days. The quality of the pain is described as shooting. The pain does not radiate. The symptoms are aggravated by standing.   Went to ED last night. Prescribed oxycodone. Her urine did not show any kidney stones. CAT scan did not show any kidney stones.   Review of Systems  Genitourinary: Positive for flank pain.   Denies fever chills sweats.    Objective:   Physical Exam  Abdomen is soft no masses lungs clear hearts regular pulse normal Some pain with movement. CT scan was reviewed with patient.      Assessment & Plan:  Pelvic lymphadenopathy-this is concerning for the possibility of cancer versus benign process I will discuss this with radiology. Then based upon that will decide whether or not followup in 3 months with another scan versus biopsy. May need to discuss case with oncology as well.   Her back pain is more than likely related to some inflammation in the muscles. I find no evidence of sciatica

## 2013-11-28 ENCOUNTER — Telehealth: Payer: Self-pay | Admitting: Family Medicine

## 2013-11-28 ENCOUNTER — Other Ambulatory Visit: Payer: Self-pay | Admitting: Family Medicine

## 2013-11-28 DIAGNOSIS — R590 Localized enlarged lymph nodes: Secondary | ICD-10-CM

## 2013-11-28 NOTE — Telephone Encounter (Signed)
I discussed the case with radiology I also discussed the case with cancer Dr. It is agreed upon that her pelvic lymph nodes are enlarged. Tests need to be run to help determine what is causing this. Referral has been put in for oncology. The patient is aware of this. She will be gone July 2 through July 11. Please try to set up her appointment so when she comes back from her trip she can get this checked out.

## 2013-12-03 ENCOUNTER — Other Ambulatory Visit (HOSPITAL_COMMUNITY): Payer: Self-pay | Admitting: Hematology and Oncology

## 2013-12-03 DIAGNOSIS — R591 Generalized enlarged lymph nodes: Secondary | ICD-10-CM

## 2013-12-04 ENCOUNTER — Telehealth (HOSPITAL_COMMUNITY): Payer: Self-pay

## 2013-12-04 NOTE — Telephone Encounter (Signed)
Patient notified and verbalized understanding of instructions. 

## 2013-12-04 NOTE — Telephone Encounter (Signed)
Message copied by Mellissa Kohut on Wed Dec 04, 2013 12:46 PM ------      Message from: [place], [place]      Created: Tue Dec 03, 2013  2:12 PM       Collie Siad,      She is a new patient and has never been seen here. Can you call this patient to tell her of appointment?   I do not feel comfortable calling her.  I have scheduled her for a PET on July 14 @11  at [place].  Be there at 1045 and no sugar or food 6 hrs prior.             Thanks      Amy        ----- Message -----         From: Farrel Gobble, MD         Sent: 12/03/2013  12:54 PM           To: Amy J Nance            Have scheduled PET scan on this patient for December 17, 2013.  Dx: Lymphoma--initial staging.  There is no definite evidence that lymphoma is present but we need a malignancy diagnosis to get the study covered.  Do not use the term when talking to the patient. If cancer is present, the scan should help [place] to localize the primary and the covering doctor will have the result when he sees the patient.  Thanks.  Dr.F       ------

## 2013-12-13 ENCOUNTER — Encounter (HOSPITAL_COMMUNITY): Payer: Self-pay | Admitting: Hematology and Oncology

## 2013-12-16 ENCOUNTER — Telehealth (HOSPITAL_COMMUNITY): Payer: Self-pay | Admitting: Hematology and Oncology

## 2013-12-16 NOTE — Telephone Encounter (Signed)
Insurance pre Patricia Fuller is still pending review. I had to reschedule PET until 7/22 and dr appt 7/23. Pt was notified

## 2013-12-17 ENCOUNTER — Ambulatory Visit (HOSPITAL_COMMUNITY): Payer: PRIVATE HEALTH INSURANCE

## 2013-12-24 ENCOUNTER — Ambulatory Visit (HOSPITAL_COMMUNITY): Payer: PRIVATE HEALTH INSURANCE

## 2013-12-25 ENCOUNTER — Ambulatory Visit (HOSPITAL_COMMUNITY)
Admission: RE | Admit: 2013-12-25 | Discharge: 2013-12-25 | Disposition: A | Discharge status: Home / Self Care | Payer: PRIVATE HEALTH INSURANCE | Source: Ambulatory Visit | Attending: Hematology and Oncology | Admitting: Hematology and Oncology

## 2013-12-25 DIAGNOSIS — R599 Enlarged lymph nodes, unspecified: Secondary | ICD-10-CM | POA: Insufficient documentation

## 2013-12-25 DIAGNOSIS — R591 Generalized enlarged lymph nodes: Secondary | ICD-10-CM

## 2013-12-25 LAB — GLUCOSE, CAPILLARY: GLUCOSE-CAPILLARY: 99 mg/dL (ref 70–99)

## 2013-12-25 MED ORDER — FLUDEOXYGLUCOSE F - 18 (FDG) INJECTION
10.8000 | Freq: Once | INTRAVENOUS | Status: AC | PRN
  Administered 2013-12-25: 10.8 via INTRAVENOUS

## 2013-12-26 ENCOUNTER — Encounter (HOSPITAL_COMMUNITY): Payer: PRIVATE HEALTH INSURANCE | Attending: Hematology

## 2013-12-26 ENCOUNTER — Encounter (HOSPITAL_BASED_OUTPATIENT_CLINIC_OR_DEPARTMENT_OTHER): Payer: PRIVATE HEALTH INSURANCE

## 2013-12-26 VITALS — BP 180/90 | HR 69 | Temp 98.1°F | Resp 20 | Ht 66.0 in | Wt 201.0 lb

## 2013-12-26 DIAGNOSIS — R7303 Prediabetes: Secondary | ICD-10-CM

## 2013-12-26 DIAGNOSIS — Z860100 Personal history of colon polyps, unspecified: Secondary | ICD-10-CM | POA: Insufficient documentation

## 2013-12-26 DIAGNOSIS — R599 Enlarged lymph nodes, unspecified: Secondary | ICD-10-CM | POA: Insufficient documentation

## 2013-12-26 DIAGNOSIS — R590 Localized enlarged lymph nodes: Secondary | ICD-10-CM | POA: Insufficient documentation

## 2013-12-26 LAB — CBC WITH DIFFERENTIAL/PLATELET
BASOS PCT: 0 % (ref 0–1)
Basophils Absolute: 0 10*3/uL (ref 0.0–0.1)
EOS ABS: 0.1 10*3/uL (ref 0.0–0.7)
Eosinophils Relative: 2 % (ref 0–5)
HCT: 43.5 % (ref 36.0–46.0)
Hemoglobin: 14.4 g/dL (ref 12.0–15.0)
Lymphocytes Relative: 39 % (ref 12–46)
Lymphs Abs: 2.5 10*3/uL (ref 0.7–4.0)
MCH: 29.6 pg (ref 26.0–34.0)
MCHC: 33.1 g/dL (ref 30.0–36.0)
MCV: 89.5 fL (ref 78.0–100.0)
Monocytes Absolute: 0.4 10*3/uL (ref 0.1–1.0)
Monocytes Relative: 7 % (ref 3–12)
NEUTROS ABS: 3.4 10*3/uL (ref 1.7–7.7)
Neutrophils Relative %: 52 % (ref 43–77)
Platelets: 207 10*3/uL (ref 150–400)
RBC: 4.86 MIL/uL (ref 3.87–5.11)
RDW: 13.5 % (ref 11.5–15.5)
WBC: 6.4 10*3/uL (ref 4.0–10.5)

## 2013-12-26 LAB — COMPREHENSIVE METABOLIC PANEL
ALBUMIN: 4.1 g/dL (ref 3.5–5.2)
ALK PHOS: 112 U/L (ref 39–117)
ALT: 17 U/L (ref 0–35)
AST: 17 U/L (ref 0–37)
Anion gap: 11 (ref 5–15)
BUN: 18 mg/dL (ref 6–23)
CO2: 25 mEq/L (ref 19–32)
Calcium: 9.8 mg/dL (ref 8.4–10.5)
Chloride: 104 mEq/L (ref 96–112)
Creatinine, Ser: 0.94 mg/dL (ref 0.50–1.10)
GFR calc Af Amer: 75 mL/min — ABNORMAL LOW (ref >90)
GFR calc non Af Amer: 65 mL/min — ABNORMAL LOW (ref >90)
Glucose, Bld: 102 mg/dL — ABNORMAL HIGH (ref 70–99)
POTASSIUM: 4.3 meq/L (ref 3.7–5.3)
Sodium: 140 mEq/L (ref 137–147)
TOTAL PROTEIN: 7.4 g/dL (ref 6.0–8.3)
Total Bilirubin: 0.4 mg/dL (ref 0.3–1.2)

## 2013-12-26 LAB — LACTATE DEHYDROGENASE: LDH: 210 U/L (ref 94–250)

## 2013-12-26 NOTE — Patient Instructions (Signed)
Lead Discharge Instructions  RECOMMENDATIONS MADE BY THE CONSULTANT AND ANY TEST RESULTS WILL BE SENT TO YOUR REFERRING PHYSICIAN.  EXAM FINDINGS BY THE PHYSICIAN TODAY AND SIGNS OR SYMPTOMS TO REPORT TO CLINIC OR PRIMARY PHYSICIAN:   Lymph nodes are seen on the PET scan but are not showing a huge FDG uptake.   In the best case scenario, this could be an infectious/inflammatory process.   On the other hand this could be a cancer of some sort.   We have placed an order for a CT guided Biopsy of a lymph node that is enlarged. We will have to wait to see if the Interventional Radiology Department in [place] feels that they can safely perform this procedure. If so, they will contact you and schedule an appointment. A biopsy or lymph node extraction will involve taking "tissue" from the lymph or taking the whole lymph node to see if there is cancerous cells present.   If this is a low grade lymphoma - it will be a slow growing process.   For now, we need to do the biopsy, get the results, and you return to see Dr. Barnet Glasgow approximately 10 days after the procedure.   Dr. Barnet Glasgow will discuss what the next steps are.     Thank you for choosing [facility] to provide your oncology and hematology care.  To afford each patient quality time with our providers, please arrive at least 15 minutes before your scheduled appointment time.  With your help, our goal is to use those 15 minutes to complete the necessary work-up to ensure our physicians have the information they need to help with your evaluation and healthcare recommendations.    Effective January 1st, 2014, we ask that you re-schedule your appointment with our physicians should you arrive 10 or more minutes late for your appointment.  We strive to give you quality time with our providers, and arriving late affects you and other patients whose appointments are after yours.    Again, thank you for  choosing [facility].  Our hope is that these requests will decrease the amount of time that you wait before being seen by our physicians.       _____________________________________________________________  Should you have questions after your visit to [facility], please contact our office at [phone] between the hours of 8:30 a.m. and 5:00 p.m.  Voicemails left after 4:30 p.m. will not be returned until the following business day.  For prescription refill requests, have your pharmacy contact our office with your prescription refill request.    Biopsy Care After These instructions give you information on caring for yourself after your procedure. Your doctor may also give you more specific instructions. Call your doctor if you have any problems or questions after your procedure. HOME CARE   Return to your normal diet and activities as told by your doctor.  Change your bandages (dressings) as told by your doctor. If skin glue (adhesive) was used, it will peel off in 7 days.  Only take medicines as told by your doctor.  Ask your doctor when you can bathe and get your wound wet. GET HELP RIGHT AWAY IF:  You see more than a small spot of blood coming from the wound.  You have redness, puffiness (swelling), or pain.  You see yellowish-white fluid (pus) coming from the wound.  You have a fever.  You notice a bad smell coming from the wound  or bandage.  You have a rash, trouble breathing, or any allergy problems. MAKE SURE YOU:   Understand these instructions.  Will watch your condition.  Will get help right away if you are not doing well or get worse. Document Released: 01/25/2011 Document Revised: 08/15/2011 Document Reviewed: 01/25/2011 [facility] Patient Information 2015 [place], [place]. This information is not intended to replace advice given to you by your health care provider. Make sure you discuss any questions you have with your health care  provider.

## 2013-12-26 NOTE — Progress Notes (Signed)
LABS DRAWN FOR LDH,B2M,CMP,CBCD

## 2013-12-26 NOTE — Progress Notes (Signed)
[place]   NEW PATIENT EVALUATION    Name: Patricia Fuller Date: 12/26/2013 MRN: [id number] DOB: [birth year]  PCP: Sallee Lange, MD   REFERRING PHYSICIAN: Kathyrn Drown, MD   REASON FOR REFERRAL:  Pelvic Lymphadenopathy     HEMATOLOGY/ONCOLOGY HX: Ms. Hamme is a 63 y.o. Woman who is referred for evaluation after undergoing an outpatient CT scan following an episode of of flank pain. Workup was negative except for bilateral pelvic lymphadenopathy less than 2cm. The patient denies discomfort at present. She had an unremarkable exam by Dr. Wolfgang Phoenix who scheduled a PET scan performed this week. The findings are discussed in detail below. There is no history of malignancy or additional symptoms of concern. The patient continues to work and perform normal activities. Recently she returned from her vacation at the beach.           PAST MEDICAL HISTORY:  has a past medical history of Hypertension; Hyperlipidemia; Prediabetes; and Renal calculi.     PAST SURGICAL HISTORY: Past Surgical History  Procedure Laterality Date  . Esophagogastroduodenoscopy  02/2005  . Colonoscopy  02/2005    small polyp     CURRENT MEDICATIONS: has a current medication list which includes the following prescription(s): losartan.   ALLERGIES: Review of patient's allergies indicates no known allergies.   SOCIAL HISTORY:  reports that she has never smoked. She does not have any smokeless tobacco history on file. She reports that she does not drink alcohol or use illicit drugs.   FAMILY HISTORY: family history includes Diabetes in her mother; Heart disease in her mother; Hypertension in her mother.    REVIEW OF SYSTEMS:   SINCE YOUR LAST VISIT Been diagnosed or treated for a new medical /surgical  problem or condition: Yes Any Recent Xrays or studies performed: Yes Any new prescription or OTC medications: No ECOG Perf Status: Fully active, able to carry on all pre-disease  performance without restriction Problems sleeping: Yes Medications taken to help sleep: No How is your appetie: 100% normal Any Supplements: No Any trouble chewing or swallowing: No Any Nausea or Vomiting: No Any Bowel problems: No # Bowel Movements per week: 3 Any Urinary Issues: No Any Cardiac Problems: No Any Respiratory Issues: No Any Neurological Issues: No Do you live alone: No Feelings hopelessness: No You or your family have any concerns or Health changes: Yes (what is the dx and what are the next steps?)  No fevers, nite sweats, malaise, or fatigue.  Other than that discussed above is noncontributory.    PHYSICAL EXAM:  height is '5\' 6"'  (1.676 m) and weight is 201 lb (91.173 kg). Her oral temperature is 98.1 F (36.7 C). Her blood pressure is 180/90 and her pulse is 69. Her respiration is 20.    GENERAL:alert, no distress and comfortable SKIN: skin color, texture, turgor are normal, no rashes or significant lesions EYES: normal, Conjunctiva are pink and non-injected, sclera clear OROPHARYNX:no exudate, no erythema and lips, buccal mucosa, and tongue normal  NECK: supple, thyroid normal size, non-tender, without nodularity CHEST: Breast exam deferred. LYMPH:  no palpable lymphadenopathy in the cervical, axillary or inguinal LUNGS: clear to auscultation and percussion with normal breathing effort HEART: regular rate & rhythm and no murmurs ABDOMEN:abdomen soft, non-tender and normal bowel sounds, liver and  Spleen nonpalpable MUSCULOSKELETAL: no cyanosis of digits, no clubbing or edema  NEURO: alert & oriented x 3 with fluent speech, no focal motor/sensory deficits,  SPINE: No localized tenderness except  for:     LABORATORY DATA:  Office Visit on 12/26/2013  Component Date Value Ref Range Status  . WBC 12/26/2013 6.4  4.0 - 10.5 K/uL Final  . RBC 12/26/2013 4.86  3.87 - 5.11 MIL/uL Final  . Hemoglobin 12/26/2013 14.4  12.0 - 15.0 g/dL Final  . HCT 12/26/2013 43.5   36.0 - 46.0 % Final  . MCV 12/26/2013 89.5  78.0 - 100.0 fL Final  . MCH 12/26/2013 29.6  26.0 - 34.0 pg Final  . MCHC 12/26/2013 33.1  30.0 - 36.0 g/dL Final  . RDW 12/26/2013 13.5  11.5 - 15.5 % Final  . Platelets 12/26/2013 207  150 - 400 K/uL Final  . Neutrophils Relative % 12/26/2013 52  43 - 77 % Final  . Neutro Abs 12/26/2013 3.4  1.7 - 7.7 K/uL Final  . Lymphocytes Relative 12/26/2013 39  12 - 46 % Final  . Lymphs Abs 12/26/2013 2.5  0.7 - 4.0 K/uL Final  . Monocytes Relative 12/26/2013 7  3 - 12 % Final  . Monocytes Absolute 12/26/2013 0.4  0.1 - 1.0 K/uL Final  . Eosinophils Relative 12/26/2013 2  0 - 5 % Final  . Eosinophils Absolute 12/26/2013 0.1  0.0 - 0.7 K/uL Final  . Basophils Relative 12/26/2013 0  0 - 1 % Final  . Basophils Absolute 12/26/2013 0.0  0.0 - 0.1 K/uL Final  . Sodium 12/26/2013 140  137 - 147 mEq/L Final  . Potassium 12/26/2013 4.3  3.7 - 5.3 mEq/L Final  . Chloride 12/26/2013 104  96 - 112 mEq/L Final  . CO2 12/26/2013 25  19 - 32 mEq/L Final  . Glucose, Bld 12/26/2013 102* 70 - 99 mg/dL Final  . BUN 12/26/2013 18  6 - 23 mg/dL Final  . Creatinine, Ser 12/26/2013 0.94  0.50 - 1.10 mg/dL Final  . Calcium 12/26/2013 9.8  8.4 - 10.5 mg/dL Final  . Total Protein 12/26/2013 7.4  6.0 - 8.3 g/dL Final  . Albumin 12/26/2013 4.1  3.5 - 5.2 g/dL Final  . AST 12/26/2013 17  0 - 37 U/L Final  . ALT 12/26/2013 17  0 - 35 U/L Final  . Alkaline Phosphatase 12/26/2013 112  39 - 117 U/L Final  . Total Bilirubin 12/26/2013 0.4  0.3 - 1.2 mg/dL Final  . GFR calc non Af Amer 12/26/2013 65* >90 mL/min Final  . GFR calc Af Amer 12/26/2013 75* >90 mL/min Final   Comment: (NOTE)                          The eGFR has been calculated using the CKD EPI equation.                          This calculation has not been validated in all clinical situations.                          eGFR's persistently <90 mL/min signify possible Chronic Kidney                          Disease.    . Anion gap 12/26/2013 11  5 - 15 Final  . LDH 12/26/2013 210  94 - 250 U/L Final  Hospital Outpatient Visit on 12/25/2013  Component Date Value Ref Range Status  . Glucose-Capillary 12/25/2013 99  70 - 99  mg/dL Final   Beta II microglobulin is pending.   R2ADIOGRAPHY: Ct Abdomen Pelvis Wo Contrast  11/26/2013   CLINICAL DATA:  Left flank pain over the past week, worsening over the past 2 days. Remote history of renal calculi.  EXAM: CT ABDOMEN AND PELVIS WITHOUT CONTRAST  TECHNIQUE: Multidetector CT imaging of the abdomen and pelvis was performed following the standard protocol without IV contrast.  COMPARISON:  Report from 10/02/2001  FINDINGS: Small hiatal hernia. Cholelithiasis with a 2.2 cm gallstone identified. No appreciable biliary dilatation.  The noncontrast CT appearance of the liver, spleen, pancreas, and adrenal glands is within normal limits.  Two calcifications in the immediate vicinity of the right distal ureter appear to be outside of the ureter on images 67-66 of series 4. This is supported by the fact that there is no ureteral dilatation or hydronephrosis. Accordingly these are considered vascular calcifications.  No renal calculi. No pathologic upper abdominal adenopathy is observed. A left common iliac lymph node measures 1.4 cm in short axis on image 46 of series 2, abnormally prominent. A right common iliac lymph node measures 0.9 cm in short axis. Right internal iliac node 1.0 cm, image 59 series 2. Right external iliac node 1.2 cm, image 64 series 2. Right pelvic sidewall/external iliac node 1.6 cm, image 68 of series 2. Left external iliac node 1.2 cm, image 64 series 2. Additional mildly enlarged iliac nodes are present bilaterally.  Appendix unremarkable. Prominent stool throughout the colon favors constipation. Uterine and adnexal contours unremarkable. No dilated bowel. Small umbilical hernia contains adipose tissue.  IMPRESSION: 1. Bilateral pelvic adenopathy could be  reactive but raises concern for neoplasm. No obvious uterine or ovarian contour abnormality. Lymphoma is not excluded. Workup might include biopsy, nuclear medicine PET-CT, or followup CT in order to ensure resolution if there is a high clinical suspicion of these are simply reactive lymph nodes. 2. Cholelithiasis. 3. Small hiatal hernia. 4. No renal calculi observed.   Electronically Signed   By: Sherryl Barters M.D.   On: 11/26/2013 21:05   Nm Pet Image Initial (pi) Skull Base To Thigh  12/25/2013   CLINICAL DATA:  Initial treatment strategy for lymphadenopathy.  EXAM: NUCLEAR MEDICINE PET SKULL BASE TO THIGH  TECHNIQUE: 10.8 mCi F-18 FDG was injected intravenously. Full-ring PET imaging was performed from the skull base to thigh after the radiotracer. CT data was obtained and used for attenuation correction and anatomic localization.  FASTING BLOOD GLUCOSE:  Value: 99 mg/dl  COMPARISON:  CT abdomen 11/26/2013  FINDINGS: NECK  No hypermetabolic lymph nodes in the neck.  CHEST  No hypermetabolic mediastinal or hilar nodes. No suspicious pulmonary nodules on the CT scan.  ABDOMEN/PELVIS  Spleen is normal volume without hypermetabolic activity. No hypermetabolic retroperitoneal lymph nodes, periportal lymph nodes, or mesenteric lymph nodes.  Again demonstrated mildly enlarged iliac lymph nodes. These mildly enlarged lymph nodes have a low metabolic activity equal to blood pool. Example lesion would include right external iliac lymph node along the obturator space measuring 11 mm short axis with SUV max of 2.8. Similar 7 mm lymph node on the left (image 180). Rounded left external iliac lymph node posterior to the vena image 175 also of low metabolic.  No abnormal activity within the liver.  SKELETON  No focal hypermetabolic activity to suggest skeletal metastasis.  IMPRESSION: 1. Low metabolic activity (less than or equal blood pool) of minimally enlarged iliac lymph nodes is NOT consistent with high-grade  lymphoma. Cannot exclude a  low-grade lymphoma such as chronic lymphocytic leukemia. 2. No hypermetabolic lymph nodes in the chest or abdomen. 3. Normal spleen.   Electronically Signed   By: Suzy Bouchard M.D.   On: 12/25/2013 12:36      IMPRESSION:   1. Mildly enlarged intrapelvic nodes of unknown etiology. In light of there being no associated FDG abnormalities on the PET scan suggests the adenopathy could be reactive or consistent with a low grade malignant process (lymphoma, gynecologic, or urologic neoplasm)  Ms. Keatley is asymptomatic and her blood work does not demonstrate any abnormalities.         RECOMMENDATIONS:  1. Ms. Pua and her family were in favor of a CT guided biopsy by interventional radiology rather then waiting 2-3 months for repeat CT scanning. Radiology is being contacted to schedule the procedure.  2. Scheduling a pelvic exam with pap smear and Mammography which I am deferring to Dr. Wolfgang Phoenix.      Ms. Raya will be scheduled to return to the cancer center to discuss her biopsy results. Thank you for referring this nice lady for consultation.    Darrall Dears, MD 12/26/2013 4:14 PM

## 2013-12-30 ENCOUNTER — Encounter (HOSPITAL_COMMUNITY): Payer: Self-pay | Admitting: Pharmacy Technician

## 2013-12-30 LAB — BETA 2 MICROGLOBULIN, SERUM: Beta-2 Microglobulin: 2.39 mg/L — ABNORMAL HIGH (ref <=2.51)

## 2013-12-31 ENCOUNTER — Other Ambulatory Visit: Payer: Self-pay | Admitting: Radiology

## 2014-01-02 ENCOUNTER — Ambulatory Visit (HOSPITAL_COMMUNITY)
Admission: RE | Admit: 2014-01-02 | Discharge: 2014-01-02 | Disposition: A | Discharge status: Home / Self Care | Payer: PRIVATE HEALTH INSURANCE | Source: Ambulatory Visit | Attending: Oncology | Admitting: Oncology

## 2014-01-02 ENCOUNTER — Encounter (HOSPITAL_COMMUNITY): Payer: Self-pay

## 2014-01-02 DIAGNOSIS — R599 Enlarged lymph nodes, unspecified: Secondary | ICD-10-CM | POA: Insufficient documentation

## 2014-01-02 DIAGNOSIS — R590 Localized enlarged lymph nodes: Secondary | ICD-10-CM

## 2014-01-02 LAB — CBC
HEMATOCRIT: 45.2 % (ref 36.0–46.0)
HEMOGLOBIN: 14.9 g/dL (ref 12.0–15.0)
MCH: 29.3 pg (ref 26.0–34.0)
MCHC: 33 g/dL (ref 30.0–36.0)
MCV: 89 fL (ref 78.0–100.0)
Platelets: 195 10*3/uL (ref 150–400)
RBC: 5.08 MIL/uL (ref 3.87–5.11)
RDW: 13.4 % (ref 11.5–15.5)
WBC: 6.1 10*3/uL (ref 4.0–10.5)

## 2014-01-02 LAB — PROTIME-INR
INR: 0.98 (ref 0.00–1.49)
PROTHROMBIN TIME: 13 s (ref 11.6–15.2)

## 2014-01-02 LAB — APTT: aPTT: 34 seconds (ref 24–37)

## 2014-01-02 MED ORDER — FENTANYL CITRATE 0.05 MG/ML IJ SOLN
INTRAMUSCULAR | Status: AC
  Filled 2014-01-02: qty 6

## 2014-01-02 MED ORDER — SODIUM CHLORIDE 0.9 % IV SOLN
INTRAVENOUS | Status: DC
  Administered 2014-01-02: 11:00:00 via INTRAVENOUS

## 2014-01-02 MED ORDER — MIDAZOLAM HCL 2 MG/2ML IJ SOLN
INTRAMUSCULAR | Status: AC | PRN
  Administered 2014-01-02: 1 mg via INTRAVENOUS
  Administered 2014-01-02: 0.5 mg via INTRAVENOUS
  Administered 2014-01-02: 1 mg via INTRAVENOUS
  Administered 2014-01-02: 0.5 mg via INTRAVENOUS

## 2014-01-02 MED ORDER — FENTANYL CITRATE 0.05 MG/ML IJ SOLN
INTRAMUSCULAR | Status: AC | PRN
  Administered 2014-01-02 (×2): 50 ug via INTRAVENOUS

## 2014-01-02 MED ORDER — MIDAZOLAM HCL 2 MG/2ML IJ SOLN
INTRAMUSCULAR | Status: AC
  Filled 2014-01-02: qty 6

## 2014-01-02 NOTE — Procedures (Signed)
Technically successful US guided biopsy of right inguinal LN.  No immediate complications.

## 2014-01-02 NOTE — Discharge Instructions (Signed)
Needle Biopsy °Care After °These instructions give you information on caring for yourself after your procedure. Your doctor may also give you more specific instructions. Call your doctor if you have any problems or questions after your procedure. °HOME CARE °· Rest for 4 hours after your biopsy, except for getting up to go to the bathroom or as told. °· Keep the places where the needles were put in clean and dry. °¨ Do not put powder or lotion on the sites. °¨ Do not shower until 24 hours after the test. Remove all bandages (dressings) before showering. °¨ Remove all bandages at least once every day. Gently clean the sites with soap and water. Keep putting a new bandage on until the skin is closed. °Finding out the results of your test °Ask your doctor when your test results will be ready. Make sure you follow up and get the test results. °GET HELP RIGHT AWAY IF:  °· You have shortness of breath or trouble breathing. °· You have pain or cramping in your belly (abdomen). °· You feel sick to your stomach (nauseous) or throw up (vomit). °· Any of the places where the needles were put in: °¨ Are puffy (swollen) or red. °¨ Are sore or hot to the touch. °¨ Are draining yellowish-white fluid (pus). °¨ Are bleeding after 10 minutes of pressing down on the site. Have someone keep pressing on any place that is bleeding until you see a doctor. °· You have any unusual pain that will not stop. °· You have a fever. °If you go to the emergency room, tell the nurse that you had a biopsy. Take this paper with you to show the nurse. °MAKE SURE YOU:  °· Understand these instructions. °· Will watch your condition. °· Will get help right away if you are not doing well or get worse. °Document Released: 05/05/2008 Document Revised: 08/15/2011 Document Reviewed: 05/05/2008 °ExitCare® Patient Information ©2015 [employer]. This information is not intended to replace advice given to you by your health care provider. Make sure you discuss  any questions you have with your health care provider. °Conscious Sedation °Sedation is the use of medicines to promote relaxation and relieve discomfort and anxiety. Conscious sedation is a type of sedation. Under conscious sedation you are less alert than normal but are still able to respond to instructions or stimulation. Conscious sedation is used during short medical and dental procedures. It is milder than deep sedation or general anesthesia and allows you to return to your regular activities sooner.  °LET YOUR HEALTH CARE PROVIDER KNOW ABOUT:  °· Any allergies you have. °· All medicines you are taking, including vitamins, herbs, eye drops, creams, and over-the-counter medicines. °· Use of steroids (by mouth or creams). °· Previous problems you or members of your family have had with the use of anesthetics. °· Any blood disorders you have. °· Previous surgeries you have had. °· Medical conditions you have. °· Possibility of pregnancy, if this applies. °· Use of cigarettes, alcohol, or illegal drugs. °RISKS AND COMPLICATIONS °Generally, this is a safe procedure. However, as with any procedure, problems can occur. Possible problems include: °· Oversedation. °· Trouble breathing on your own. You may need to have a breathing tube until you are awake and breathing on your own. °· Allergic reaction to any of the medicines used for the procedure. °BEFORE THE PROCEDURE °· You may have blood tests done. These tests can help show how well your kidneys and liver are working. They can also   show how well your blood clots. °· A physical exam will be done.   °· Only take medicines as directed by your health care provider. You may need to stop taking medicines (such as blood thinners, aspirin, or nonsteroidal anti-inflammatory drugs) before the procedure.   °· Do not eat or drink at least 6 hours before the procedure or as directed by your health care provider. °· Arrange for a responsible adult, family member, or friend to  take you home after the procedure. He or she should stay with you for at least 24 hours after the procedure, until the medicine has worn off. °PROCEDURE  °· An intravenous (IV) catheter will be inserted into one of your veins. Medicine will be able to flow directly into your body through this catheter. You may be given medicine through this tube to help prevent pain and help you relax. °· The medical or dental procedure will be done. °AFTER THE PROCEDURE °· You will stay in a recovery area until the medicine has worn off. Your blood pressure and pulse will be checked.   °·  Depending on the procedure you had, you may be allowed to go home when you can tolerate liquids and your pain is under control. °Document Released: 02/15/2001 Document Revised: 05/28/2013 Document Reviewed: 01/28/2013 °ExitCare® Patient Information ©2015 [employer]. This information is not intended to replace advice given to you by your health care provider. Make sure you discuss any questions you have with your health care provider. ° °Conscious Sedation, Adult, Care After °Refer to this sheet in the next few weeks. These instructions provide you with information on caring for yourself after your procedure. Your health care provider may also give you more specific instructions. Your treatment has been planned according to current medical practices, but problems sometimes occur. Call your health care provider if you have any problems or questions after your procedure. °WHAT TO EXPECT AFTER THE PROCEDURE  °After your procedure: °· You may feel sleepy, clumsy, and have poor balance for several hours. °· Vomiting may occur if you eat too soon after the procedure. °HOME CARE INSTRUCTIONS °· Do not participate in any activities where you could become injured for at least 24 hours. Do not: °¨ Drive. °¨ Swim. °¨ Ride a bicycle. °¨ Operate heavy machinery. °¨ Cook. °¨ Use power tools. °¨ Climb ladders. °¨ Work from a high place. °· Do not make  important decisions or sign legal documents until you are improved. °· If you vomit, drink water, juice, or soup when you can drink without vomiting. Make sure you have little or no nausea before eating solid foods. °· Only take over-the-counter or prescription medicines for pain, discomfort, or fever as directed by your health care provider. °· Make sure you and your family fully understand everything about the medicines given to you, including what side effects may occur. °· You should not drink alcohol, take sleeping pills, or take medicines that cause drowsiness for at least 24 hours. °· If you smoke, do not smoke without supervision. °· If you are feeling better, you may resume normal activities 24 hours after you were sedated. °· Keep all appointments with your health care provider. °SEEK MEDICAL CARE IF: °· Your skin is pale or bluish in color. °· You continue to feel nauseous or vomit. °· Your pain is getting worse and is not helped by medicine. °· You have bleeding or swelling. °· You are still sleepy or feeling clumsy after 24 hours. °SEEK IMMEDIATE MEDICAL CARE IF: °· You develop   a rash. °· You have difficulty breathing. °· You develop any type of allergic problem. °· You have a fever. °MAKE SURE YOU: °· Understand these instructions. °· Will watch your condition. °· Will get help right away if you are not doing well or get worse. °Document Released: 03/13/2013 Document Reviewed: 03/13/2013 °ExitCare® Patient Information ©2015 [employer]. This information is not intended to replace advice given to you by your health care provider. Make sure you discuss any questions you have with your health care provider. ° ° °

## 2014-01-02 NOTE — H&P (Signed)
Patricia Fuller is an 63 y.o. female.   Chief Complaint: " I'm having a biopsy" HPI: Patient with prior history of flank pain, no history of malignancy and recent CT/PET revealing mildly enlarged/low metabolic activity pelvic lymph nodes of uncertain etiology. She presents today for US guided inguinal lymph node biopsy.   Past Medical History  Diagnosis Date  . Hypertension   . Hyperlipidemia   . Prediabetes   . Renal calculi     Past Surgical History  Procedure Laterality Date  . Esophagogastroduodenoscopy  02/2005  . Colonoscopy  02/2005    small polyp    Family History  Problem Relation Age of Onset  . Hypertension Mother   . Diabetes Mother   . Heart disease Mother    Social History:  reports that she has never smoked. She does not have any smokeless tobacco history on file. She reports that she does not drink alcohol or use illicit drugs.  Allergies: No Known Allergies  Current outpatient prescriptions:losartan (COZAAR) 50 MG tablet, Take 50 mg by mouth every morning., Disp: , Rfl:  Current facility-administered medications:fentaNYL (SUBLIMAZE) 0.05 MG/ML injection, , , , ;  midazolam (VERSED) 2 MG/2ML injection, , , ,  Facility-Administered Medications Ordered in Other Encounters: 0.9 %  sodium chloride infusion, , Intravenous, Continuous, Ascencion Dike, PA-C, Last Rate: 10 mL/hr at 01/02/14 1115  Results for orders placed in visit on 12/26/13  CBC WITH DIFFERENTIAL      Result Value Ref Range   WBC 6.4  4.0 - 10.5 K/uL   RBC 4.86  3.87 - 5.11 MIL/uL   Hemoglobin 14.4  12.0 - 15.0 g/dL   HCT 43.5  36.0 - 46.0 %   MCV 89.5  78.0 - 100.0 fL   MCH 29.6  26.0 - 34.0 pg   MCHC 33.1  30.0 - 36.0 g/dL   RDW 13.5  11.5 - 15.5 %   Platelets 207  150 - 400 K/uL   Neutrophils Relative % 52  43 - 77 %   Neutro Abs 3.4  1.7 - 7.7 K/uL   Lymphocytes Relative 39  12 - 46 %   Lymphs Abs 2.5  0.7 - 4.0 K/uL   Monocytes Relative 7  3 - 12 %   Monocytes Absolute 0.4  0.1 - 1.0 K/uL   Eosinophils Relative 2  0 - 5 %   Eosinophils Absolute 0.1  0.0 - 0.7 K/uL   Basophils Relative 0  0 - 1 %   Basophils Absolute 0.0  0.0 - 0.1 K/uL  COMPREHENSIVE METABOLIC PANEL      Result Value Ref Range   Sodium 140  137 - 147 mEq/L   Potassium 4.3  3.7 - 5.3 mEq/L   Chloride 104  96 - 112 mEq/L   CO2 25  19 - 32 mEq/L   Glucose, Bld 102 (*) 70 - 99 mg/dL   BUN 18  6 - 23 mg/dL   Creatinine, Ser 0.94  0.50 - 1.10 mg/dL   Calcium 9.8  8.4 - 10.5 mg/dL   Total Protein 7.4  6.0 - 8.3 g/dL   Albumin 4.1  3.5 - 5.2 g/dL   AST 17  0 - 37 U/L   ALT 17  0 - 35 U/L   Alkaline Phosphatase 112  39 - 117 U/L   Total Bilirubin 0.4  0.3 - 1.2 mg/dL   GFR calc non Af Amer 65 (*) >90 mL/min   GFR calc Af Amer 75 (*) >90  mL/min   Anion gap 11  5 - 15  BETA 2 MICROGLOBULIN, SERUM      Result Value Ref Range   Beta-2 Microglobulin 2.39 (*) <=2.51 mg/L  LACTATE DEHYDROGENASE      Result Value Ref Range   LDH 210  94 - 250 U/L    Review of Systems  Constitutional: Negative for fever, chills and weight loss.  Respiratory: Negative for cough, hemoptysis and shortness of breath.   Cardiovascular: Negative for chest pain.  Gastrointestinal: Negative for nausea, vomiting, abdominal pain and blood in stool.  Genitourinary: Negative for dysuria, frequency and hematuria.  Musculoskeletal: Negative for back pain.  Neurological: Negative for headaches.  Endo/Heme/Allergies: Does not bruise/bleed easily.  Psychiatric/Behavioral: The patient is nervous/anxious.     Blood pressure 158/77, pulse 61, temperature 97.6 F (36.4 C), temperature source Oral, resp. rate 16, SpO2 100.00%. Physical Exam  Constitutional: She is oriented to person, place, and time. She appears well-developed and well-nourished.  Cardiovascular: Normal rate and regular rhythm.   Respiratory: Effort normal and breath sounds normal.  GI: Soft. Bowel sounds are normal. There is no tenderness.  Musculoskeletal: Normal range of  motion. She exhibits no edema.  Neurological: She is alert and oriented to person, place, and time.     Assessment/Plan Patient with prior history of flank pain, no history of malignancy and recent CT/PET revealing mildly enlarged/low metabolic activity pelvic lymph nodes of uncertain etiology. She presents today for US guided inguinal lymph node biopsy. Details/risks of procedure d/w pt with her understanding and consent.  [name],D KEVIN 01/02/2014, 12:45 PM

## 2014-01-13 ENCOUNTER — Encounter (HOSPITAL_COMMUNITY): Payer: PRIVATE HEALTH INSURANCE | Attending: Hematology

## 2014-01-13 ENCOUNTER — Encounter (HOSPITAL_COMMUNITY): Payer: Self-pay

## 2014-01-13 ENCOUNTER — Ambulatory Visit (HOSPITAL_COMMUNITY): Payer: PRIVATE HEALTH INSURANCE

## 2014-01-13 ENCOUNTER — Ambulatory Visit (HOSPITAL_COMMUNITY)
Admission: RE | Admit: 2014-01-13 | Discharge: 2014-01-13 | Disposition: A | Discharge status: Home / Self Care | Payer: PRIVATE HEALTH INSURANCE | Source: Ambulatory Visit | Attending: Hematology and Oncology | Admitting: Hematology and Oncology

## 2014-01-13 VITALS — BP 152/72 | HR 70 | Temp 98.4°F | Resp 18 | Wt 203.0 lb

## 2014-01-13 DIAGNOSIS — R599 Enlarged lymph nodes, unspecified: Secondary | ICD-10-CM | POA: Diagnosis present

## 2014-01-13 DIAGNOSIS — I888 Other nonspecific lymphadenitis: Secondary | ICD-10-CM

## 2014-01-13 DIAGNOSIS — I10 Essential (primary) hypertension: Secondary | ICD-10-CM | POA: Insufficient documentation

## 2014-01-13 DIAGNOSIS — I881 Chronic lymphadenitis, except mesenteric: Secondary | ICD-10-CM | POA: Insufficient documentation

## 2014-01-13 DIAGNOSIS — R590 Localized enlarged lymph nodes: Secondary | ICD-10-CM

## 2014-01-13 LAB — CBC WITH DIFFERENTIAL/PLATELET
BASOS ABS: 0 10*3/uL (ref 0.0–0.1)
Basophils Relative: 0 % (ref 0–1)
Eosinophils Absolute: 0.1 10*3/uL (ref 0.0–0.7)
Eosinophils Relative: 2 % (ref 0–5)
HEMATOCRIT: 45.9 % (ref 36.0–46.0)
Hemoglobin: 14.9 g/dL (ref 12.0–15.0)
LYMPHS ABS: 2.2 10*3/uL (ref 0.7–4.0)
LYMPHS PCT: 30 % (ref 12–46)
MCH: 29.4 pg (ref 26.0–34.0)
MCHC: 32.5 g/dL (ref 30.0–36.0)
MCV: 90.7 fL (ref 78.0–100.0)
MONO ABS: 0.5 10*3/uL (ref 0.1–1.0)
Monocytes Relative: 7 % (ref 3–12)
Neutro Abs: 4.4 10*3/uL (ref 1.7–7.7)
Neutrophils Relative %: 61 % (ref 43–77)
PLATELETS: 224 10*3/uL (ref 150–400)
RBC: 5.06 MIL/uL (ref 3.87–5.11)
RDW: 13.6 % (ref 11.5–15.5)
WBC: 7.2 10*3/uL (ref 4.0–10.5)

## 2014-01-13 LAB — SEDIMENTATION RATE: SED RATE: 3 mm/h (ref 0–22)

## 2014-01-13 NOTE — Patient Instructions (Addendum)
[facility] Discharge Instructions  RECOMMENDATIONS MADE BY THE CONSULTANT AND ANY TEST RESULTS WILL BE SENT TO YOUR REFERRING PHYSICIAN.  EXAM FINDINGS BY THE PHYSICIAN TODAY AND SIGNS OR SYMPTOMS TO REPORT TO CLINIC OR PRIMARY PHYSICIAN: Exam and findings as discussed by Dr. Barnet GlasgowPossible Sarcoidosis).   INSTRUCTIONS/FOLLOW-UP:  Chest x-ray today. Return in 6 months for lab work and office visit.   Thank you for choosing [facility] to provide your oncology and hematology care.  To afford each patient quality time with our providers, please arrive at least 15 minutes before your scheduled appointment time.  With your help, our goal is to use those 15 minutes to complete the necessary work-up to ensure our physicians have the information they need to help with your evaluation and healthcare recommendations.    Effective January 1st, 2014, we ask that you re-schedule your appointment with our physicians should you arrive 10 or more minutes late for your appointment.  We strive to give you quality time with our providers, and arriving late affects you and other patients whose appointments are after yours.    Again, thank you for choosing [facility].  Our hope is that these requests will decrease the amount of time that you wait before being seen by our physicians.       _____________________________________________________________  Should you have questions after your visit to [facility], please contact our office at [phone] between the hours of 8:30 a.m. and 4:30 p.m.  Voicemails left after 4:30 p.m. will not be returned until the following business day.  For prescription refill requests, have your pharmacy contact our office with your prescription refill request.    _______________________________________________________________  We hope that we have given you very good care.  You may receive a patient satisfaction  survey in the mail, please complete it and return it as soon as possible.  We value your feedback!  _______________________________________________________________  Have you asked about our STAR program?  STAR stands for Survivorship Training and Rehabilitation, and this is a nationally recognized cancer care program that focuses on survivorship and rehabilitation.  Cancer and cancer treatments may cause problems, such as, pain, making you feel tired and keeping you from doing the things that you need or want to do. Cancer rehabilitation can help. Our goal is to reduce these troubling effects and help you have the best quality of life possible.  You may receive a survey from a nurse that asks questions about your current state of health.  Based on the survey results, all eligible patients will be referred to the [facility] program for an evaluation so we can better serve you!  A frequently asked questions sheet is available upon request. Sarcoidosis, Schaumann's Disease, Sarcoid of [place] Sarcoidosis appears briefly and heals naturally in 40 to 70 percent of cases, often without the patient knowing or doing anything about it. 20 to 30 percent of patients with sarcoidosis are left with some permanent lung damage. In 10 to 15 percent of the patients, sarcoidosis can become chronic (long lasting). When either the granulomas or fibrosis seriously affect the function of a vital organ (lungs, heart, nervous system, liver, or kidneys), sarcoidosis can be fatal. This occurs 5 to 10 percent of the time. No one can predict how sarcoidosis will progress in an individual patient. The symptoms the patient experiences, the caregiver's findings, and the patient's race can give some clues. Sarcoidosis was once considered a rare disease.  We now know that it is a common chronic illness that appears all over the world. It is the most common of the fibrotic (scarring) lung disorders. Anyone can get sarcoidosis. It occurs in all  races and in both sexes. The risk is greater if you are a young black adult, especially a black woman, or are of [place], [place], [place], or [place] origin. In sarcoidosis, small lumps (also called nodules or granulomas) develop in multiple organs of the body. These granulomas are small collections of inflamed cells. They commonly appear in the lungs. This is the most common organ affected. They also occur in the lymph nodes (your glands), skin, liver, and eyes. The granulomas vary in the amount of disease they produce from very little with no problems (symptoms) to causing severe illness. The cause of sarcoidosis is not known. It may be due to an abnormal immune reaction in the body. Most people will recover. A few people will develop long lasting conditions that may get worse. Women are affected more often than men. The majority of those affected are under 51 years of age. Because we do not know the cause, we do not have ways to prevent it. SYMPTOMS   Fever.  Loss of appetite.  Night sweats.  Joint pain.  Aching muscles Symptoms vary because the disease affects different parts of the body in different people. Most people who see their caregiver with sarcoidosis have lung problems. The first signs are usually a dry cough and shortness of breath. There may also be wheezing, chest pain, or a cough that brings up bloody mucus. In severe cases, lung function may become so poor that the person cannot perform even the simple routine tasks of daily life. Other symptoms of sarcoidosis are less common than lung symptoms. They can include:  Skin symptoms. Sarcoidosis can appear as a collection of tender, red bumps called erythema nodosum. These bumps usually occur on the face, shins, and arms. They can also occur as a scaly, purplish discoloration on the nose, cheeks, and ears. This is called lupus pernio. Less often, sarcoidosis causes cysts, pimples, or disfiguring over growths of skin. In many  cases, the disfiguring over growths develop in areas of scars or tattoos.  Eye symptoms. These include redness, eye pain, and sensitivity to light.  Heart symptoms. These include irregular heartbeat and heart failure.  Other symptoms. A person may have paralyzed facial muscles, seizures, psychiatric symptoms, swollen salivary glands, or bone pain. DIAGNOSIS  Even when there are no symptoms, your caregiver can sometimes pick up signs of sarcoidosis during a routine examination, usually through a chest x-ray or when checking other complaints. The patient's age and race or ethnic group can raise an additional red flag that a sign or symptom could be related to sarcoidosis.   Enlargement of the salivary or tear glands and cysts in bone tissue may also be caused by sarcoidosis.  You may have had a biopsy done that shows signs of sarcoidosis. A biopsy is a small tissue sample that is removed for laboratory testing. This tissue sample can be taken from your lung, skin, lip, or another inflamed or abnormal area of the body.  You may have had an abnormal chest X-ray. Although you appear healthy, a chest X-ray ordered for other reasons may turn up abnormalities that suggest sarcoidosis.  Other tests may be needed. These tests may be done to rule out other illnesses or to determine the amount of organ damage caused by sarcoidosis. Some of  the most common tests are:  Blood levels of calcium or angiotensin-converting enzyme may be high in people with sarcoidosis.  Blood tests to evaluate how well your liver is functioning.  Lung function tests to measure how well you are breathing.  A complete eye examination. TREATMENT  If sarcoidosis does not cause any problems, treatment may not be necessary. Your caregiver may decide to simply monitor your condition. As part of this monitoring process, you may have frequent office visits, follow-up chest X-rays, and tests of your lung function.If you have signs of  moderate or severe lung disease, your doctor may recommend:  A corticosteroid drug, such as prednisone (sold under several brand names).  Corticosteroids also are used to treat sarcoidosis of the eyes, joints, skin, nerves, or heart.  Corticosteroid eye drops may be used for the eyes.  Over-the-counter medications like nonsteroidal anti-inflammatory drugs (NSAID) often are used to treat joint pain first before corticosteroids, which tend to have more side effects.  If corticosteroids are not effective or cause serious side effects, other drugs that alter or suppress the immune system may be used.  In rare cases, when sarcoidosis causes life-threatening lung disease, a lung transplant may be necessary. However, there is some risk that the new lungs also will be attacked by sarcoidosis. SEEK IMMEDIATE MEDICAL CARE IF:   You suffer from shortness of breath or a lingering cough.  You develop new problems that may be related to the disease. Remember this disease can affect almost all organs of the body and cause many different problems. Document Released: 03/23/2004 Document Revised: 08/15/2011 Document Reviewed: 09/18/2013 [facility] Patient Information 2015 [place], [place]. This information is not intended to replace advice given to you by your health care provider. Make sure you discuss any questions you have with your health care provider.

## 2014-01-13 NOTE — Progress Notes (Signed)
[facility]  OFFICE PROGRESS NOTE  LUKING,SCOTT, MD [place] [address]  DIAGNOSIS: Pelvic lymphadenopathy - Plan: Angiotensin converting enzyme, Sedimentation rate, CBC with Differential, Angiotensin converting enzyme, Sedimentation rate, CBC with Differential  Granulomatous lymphadenitis, non suppurative - Plan: Toxoplasma antibodies- IgG and  IgM, DG Chest 2 View, Toxoplasma antibodies- IgG and  IgM  Chief Complaint  Patient presents with  . pelvic lymphadenopathy    Non-suppurative granulomatous lymphadenitis    CURRENT THERAPY: Followup after biopsy of inguinal lymph node.  INTERVAL HISTORY: Patricia Fuller 63 y.o. female returns for followup after biopsy of right inguinal lymph nodes to explain mild positivity on PET scan after pelvic lymphadenopathy was discovered on CAT scan during evaluation for left flank discomfort on 11/26/2013. She denies any specific complaints. Appetite is good without nausea, vomiting, diarrhea, or constipation. Normal urination with no hematuria, hesitancy, or incontinence. No history of cats at home. No recent lower extremity toe nail infections or skin infections. No fever, night sweats, cough, wheezing, PND, orthopnea, palpitations, headache, syncopal episodes, or seizures.  MEDICAL HISTORY: Past Medical History  Diagnosis Date  . Hypertension   . Hyperlipidemia   . Prediabetes   . Renal calculi     INTERIM HISTORY: has Essential hypertension, benign; Other and unspecified hyperlipidemia; Prediabetes; and Pelvic lymphadenopathy on her problem list.    ALLERGIES:  has No Known Allergies.  MEDICATIONS: has a current medication list which includes the following prescription(s): losartan.  SURGICAL HISTORY:  Past Surgical History  Procedure Laterality Date  . Esophagogastroduodenoscopy  02/2005  . Colonoscopy  02/2005    small polyp    FAMILY HISTORY: family history includes  Diabetes in her mother; Heart disease in her mother; Hypertension in her mother.  SOCIAL HISTORY:  reports that she has never smoked. She does not have any smokeless tobacco history on file. She reports that she does not drink alcohol or use illicit drugs.  REVIEW OF SYSTEMS:  Other than that discussed above is noncontributory.  PHYSICAL EXAMINATION: ECOG PERFORMANCE STATUS: 0 - Asymptomatic  Blood pressure 152/72, pulse 70, temperature 98.4 F (36.9 C), temperature source Oral, resp. rate 18, weight 203 lb (92.08 kg).  GENERAL:alert, no distress and comfortable SKIN: skin color, texture, turgor are normal, no rashes or significant lesions EYES: PERLA; Conjunctiva are pink and non-injected, sclera clear SINUSES: No redness or tenderness over maxillary or ethmoid sinuses OROPHARYNX:no exudate, no erythema on lips, buccal mucosa, or tongue. NECK: supple, thyroid normal size, non-tender, without nodularity. No masses CHEST: Normal AP diameter with no breast masses. LYMPH:  no palpable lymphadenopathy in the cervical, axillary or inguinal LUNGS: clear to auscultation and percussion with normal breathing effort HEART: regular rate & rhythm and no murmurs. ABDOMEN:abdomen soft, non-tender and normal bowel sounds. Soft with no organomegaly, ascites, or CVA tenderness. MUSCULOSKELETAL:no cyanosis of digits and no clubbing. Range of motion normal.  NEURO: alert & oriented x 3 with fluent speech, no focal motor/sensory deficits   LABORATORY DATA: Office Visit on 01/13/2014  Component Date Value Ref Range Status  . WBC 01/13/2014 7.2  4.0 - 10.5 K/uL Final  . RBC 01/13/2014 5.06  3.87 - 5.11 MIL/uL Final  . Hemoglobin 01/13/2014 14.9  12.0 - 15.0 g/dL Final  . HCT 01/13/2014 45.9  36.0 - 46.0 % Final  . MCV 01/13/2014 90.7  78.0 - 100.0 fL Final  . MCH 01/13/2014 29.4  26.0 - 34.0 pg  Final  . MCHC 01/13/2014 32.5  30.0 - 36.0 g/dL Final  . RDW 01/13/2014 13.6  11.5 - 15.5 % Final  .  Platelets 01/13/2014 224  150 - 400 K/uL Final  . Neutrophils Relative % 01/13/2014 61  43 - 77 % Final  . Neutro Abs 01/13/2014 4.4  1.7 - 7.7 K/uL Final  . Lymphocytes Relative 01/13/2014 30  12 - 46 % Final  . Lymphs Abs 01/13/2014 2.2  0.7 - 4.0 K/uL Final  . Monocytes Relative 01/13/2014 7  3 - 12 % Final  . Monocytes Absolute 01/13/2014 0.5  0.1 - 1.0 K/uL Final  . Eosinophils Relative 01/13/2014 2  0 - 5 % Final  . Eosinophils Absolute 01/13/2014 0.1  0.0 - 0.7 K/uL Final  . Basophils Relative 01/13/2014 0  0 - 1 % Final  . Basophils Absolute 01/13/2014 0.0  0.0 - 0.1 K/uL Final  Hospital Outpatient Visit on 01/02/2014  Component Date Value Ref Range Status  . aPTT 01/02/2014 34  24 - 37 seconds Final  . WBC 01/02/2014 6.1  4.0 - 10.5 K/uL Final  . RBC 01/02/2014 5.08  3.87 - 5.11 MIL/uL Final  . Hemoglobin 01/02/2014 14.9  12.0 - 15.0 g/dL Final  . HCT 01/02/2014 45.2  36.0 - 46.0 % Final  . MCV 01/02/2014 89.0  78.0 - 100.0 fL Final  . MCH 01/02/2014 29.3  26.0 - 34.0 pg Final  . MCHC 01/02/2014 33.0  30.0 - 36.0 g/dL Final  . RDW 01/02/2014 13.4  11.5 - 15.5 % Final  . Platelets 01/02/2014 195  150 - 400 K/uL Final  . Prothrombin Time 01/02/2014 13.0  11.6 - 15.2 seconds Final  . INR 01/02/2014 0.98  0.00 - 1.49 Final  Office Visit on 12/26/2013  Component Date Value Ref Range Status  . WBC 12/26/2013 6.4  4.0 - 10.5 K/uL Final  . RBC 12/26/2013 4.86  3.87 - 5.11 MIL/uL Final  . Hemoglobin 12/26/2013 14.4  12.0 - 15.0 g/dL Final  . HCT 12/26/2013 43.5  36.0 - 46.0 % Final  . MCV 12/26/2013 89.5  78.0 - 100.0 fL Final  . MCH 12/26/2013 29.6  26.0 - 34.0 pg Final  . MCHC 12/26/2013 33.1  30.0 - 36.0 g/dL Final  . RDW 12/26/2013 13.5  11.5 - 15.5 % Final  . Platelets 12/26/2013 207  150 - 400 K/uL Final  . Neutrophils Relative % 12/26/2013 52  43 - 77 % Final  . Neutro Abs 12/26/2013 3.4  1.7 - 7.7 K/uL Final  . Lymphocytes Relative 12/26/2013 39  12 - 46 % Final  .  Lymphs Abs 12/26/2013 2.5  0.7 - 4.0 K/uL Final  . Monocytes Relative 12/26/2013 7  3 - 12 % Final  . Monocytes Absolute 12/26/2013 0.4  0.1 - 1.0 K/uL Final  . Eosinophils Relative 12/26/2013 2  0 - 5 % Final  . Eosinophils Absolute 12/26/2013 0.1  0.0 - 0.7 K/uL Final  . Basophils Relative 12/26/2013 0  0 - 1 % Final  . Basophils Absolute 12/26/2013 0.0  0.0 - 0.1 K/uL Final  . Sodium 12/26/2013 140  137 - 147 mEq/L Final  . Potassium 12/26/2013 4.3  3.7 - 5.3 mEq/L Final  . Chloride 12/26/2013 104  96 - 112 mEq/L Final  . CO2 12/26/2013 25  19 - 32 mEq/L Final  . Glucose, Bld 12/26/2013 102* 70 - 99 mg/dL Final  . BUN 12/26/2013 18  6 - 23 mg/dL Final  .  Creatinine, Ser 12/26/2013 0.94  0.50 - 1.10 mg/dL Final  . Calcium 12/26/2013 9.8  8.4 - 10.5 mg/dL Final  . Total Protein 12/26/2013 7.4  6.0 - 8.3 g/dL Final  . Albumin 12/26/2013 4.1  3.5 - 5.2 g/dL Final  . AST 12/26/2013 17  0 - 37 U/L Final  . ALT 12/26/2013 17  0 - 35 U/L Final  . Alkaline Phosphatase 12/26/2013 112  39 - 117 U/L Final  . Total Bilirubin 12/26/2013 0.4  0.3 - 1.2 mg/dL Final  . GFR calc non Af Amer 12/26/2013 65* >90 mL/min Final  . GFR calc Af Amer 12/26/2013 75* >90 mL/min Final   Comment: (NOTE)                          The eGFR has been calculated using the CKD EPI equation.                          This calculation has not been validated in all clinical situations.                          eGFR's persistently <90 mL/min signify possible Chronic Kidney                          Disease.  . Anion gap 12/26/2013 11  5 - 15 Final  . Beta-2 Microglobulin 12/26/2013 2.39* <=2.51 mg/L Final   Performed at [facility]  . LDH 12/26/2013 210  94 - 250 U/L Final  Hospital Outpatient Visit on 12/25/2013  Component Date Value Ref Range Status  . Glucose-Capillary 12/25/2013 99  70 - 99 mg/dL Final    PATHOLOGY:  for ANYJAH, [facility] ([id number]) Patient: Lavella Hammock Collected: 01/02/2014 Client: [facility] Accession: [id number] Received: 01/02/2014 John "Ronny Bacon, MD DOB: 1951/02/12 Age: 11 Gender: F Reported: 01/06/2014 [address] Patient Ph: [phone] MRN #: [id number] [place], [place] [address] Visit #: [id number].[id number] Chart #: Phone: [phone] Fax: CC: Robynn Pane, PA-C REPORT OF SURGICAL PATHOLOGY FINAL DIAGNOSIS Diagnosis Lymph node, needle/core biopsy, right inguinal - NON-SUPPURATIVE GRANULOMATOUS LYMPHADENITIS, SEE COMMENT. - NO MORPHOLOGIC EVIDENCE OF LYMPHOMA OR MALIGNANCY. Diagnosis Note Core biopsies of lymph node reveal sinus histiocytosis, small clusters of histocytes and scattered granulomas. There is no necrosis or acute inflammation. PAS, GMS, and AFB are negative for organisms. These findings are benign and consistent with a non-suppurative granulomatous lymphadenitis. [name] MD Pathologist, Electronic Signature (Case signed 01/06/2014) Specimen Gross and Clinical Information Specimen(s) Obtained: Lymph node, needle/core biopsy, right inguinal Specimen Clinical Information Likely reactive shotty bilateral pelvic and inguinal LNs, not hypermetabolic on PET (tl) Gross Received in saline are five cores of tan gray soft tissue ranging from 0.3 x 0.1 cm to 2 x 0.1 cm. Half of two longer cores are held in RPMI for possible flow cytometry, and the remaining specimen is submitted in one block for routine histology. (SSW:kh 01-02-14) Stain(s) used in Diagnosis: The following stain(s) were used in diagnosing the case: *Acid Fast Bacillus Stain, *PAS/F Stain, GMS. The control(s) stained appropriately. Report signed out from the following location(s) Technical Component performed at [facility]. [address] of 2 FINAL for [place], [place] [phone]) Report signed out from the following location(s)(continued) [address],[place] [address].[id number]., Technical Component performed at [place], [place], [place] [address].  CLIA #: [id number], Interpretation performed at [place], [place], [place] [address]. CLIA #: [id number],    Urinalysis    Component Value Date/Time   COLORURINE YELLOW 11/26/2013 1949   APPEARANCEUR CLEAR 11/26/2013 1949   LABSPEC >1.030* 11/26/2013 1949   PHURINE 5.5 11/26/2013 1949   GLUCOSEU NEGATIVE 11/26/2013 1949   HGBUR NEGATIVE 11/26/2013 1949   BILIRUBINUR NEGATIVE 11/26/2013 1949   KETONESUR NEGATIVE 11/26/2013 1949   PROTEINUR NEGATIVE 11/26/2013 1949   UROBILINOGEN 0.2 11/26/2013 1949   NITRITE NEGATIVE 11/26/2013 1949   LEUKOCYTESUR NEGATIVE 11/26/2013 1949    RADIOGRAPHIC STUDIES: Nm Pet Image Initial (pi) Skull Base To Thigh  12/25/2013   CLINICAL DATA:  Initial treatment strategy for lymphadenopathy.  EXAM: NUCLEAR MEDICINE PET SKULL BASE TO THIGH  TECHNIQUE: 10.8 mCi F-18 FDG was injected intravenously. Full-ring PET imaging was performed from the skull base to thigh after the radiotracer. CT data was obtained and used for attenuation correction and anatomic localization.  FASTING BLOOD GLUCOSE:  Value: 99 mg/dl  COMPARISON:  CT abdomen 11/26/2013  FINDINGS: NECK  No hypermetabolic lymph nodes in the neck.  CHEST  No hypermetabolic mediastinal or hilar nodes. No suspicious pulmonary nodules on the CT scan.  ABDOMEN/PELVIS  Spleen is normal volume without hypermetabolic activity. No hypermetabolic retroperitoneal lymph nodes, periportal lymph nodes, or mesenteric lymph nodes.  Again demonstrated mildly enlarged iliac lymph nodes. These mildly enlarged lymph nodes have a low metabolic activity equal to blood pool. Example lesion would include right external iliac lymph node along the obturator space measuring 11 mm short axis with SUV max of 2.8. Similar 7 mm lymph node on the left (image 180). Rounded left external iliac lymph node posterior to the vena image 175 also of low metabolic.  No abnormal activity  within the liver.  SKELETON  No focal hypermetabolic activity to suggest skeletal metastasis.  IMPRESSION: 1. Low metabolic activity (less than or equal blood pool) of minimally enlarged iliac lymph nodes is NOT consistent with high-grade lymphoma. Cannot exclude a low-grade lymphoma such as chronic lymphocytic leukemia. 2. No hypermetabolic lymph nodes in the chest or abdomen. 3. Normal spleen.   Electronically Signed   By: Suzy Bouchard M.D.   On: 12/25/2013 12:36   US Biopsy  01/02/2014   INDICATION: No known primary malignancy with non hypermetabolic shotty borderline enlarged bilateral pelvic and inguinal lymph nodes. Please perform ultrasound-guided biopsy for tissue diagnostic purposes.  EXAM: ULTRASOUND-GUIDED RIGHT INGUINAL LYMPH NODE BIOPSY  COMPARISON:  CT the abdomen pelvis - 11/26/2013; PET-CT - 12/25/2013  MEDICATIONS: Fentanyl 100 mcg IV; Versed 3 mg IV  ANESTHESIA/SEDATION: Total Moderate Sedation time  13 minutes  COMPLICATIONS: None immediate  TECHNIQUE: Informed written consent was obtained from the patient after a discussion of the risks, benefits and alternatives to treatment. Questions regarding the procedure were encouraged and answered. Initial ultrasound scanning demonstrated shotty, non pathologically enlarged bilateral inguinal lymph nodes correlating with the findings seen on recently performed PET and abdominal CT scans. An ultrasound image was saved for documentation purposes. The right inguinal lymph node was selected for biopsy. The procedure was planned. A timeout was performed prior to the initiation of the procedure.  The operative was prepped and draped in the usual sterile fashion, and a sterile drape was applied covering the operative field. A timeout was performed prior to the initiation of the procedure. Local anesthesia was provided with 1% lidocaine with epinephrine.  Under direct ultrasound guidance, an 18 gauge  core needle device was utilized to obtain to obtain 5  core needle biopsied of the dominant right inguinal lymph node.  The samples were placed in saline and submitted to pathology. The needle was removed and hemostasis was achieved with manual compression. Post procedure scan was negative for significant hematoma. A dressing was placed. The patient tolerated the procedure well without immediate postprocedural complication.  IMPRESSION: Technically successful ultrasound guided right inguinal lymph node core needle biopsy.   Electronically Signed   By: Sandi Mariscal M.D.   On: 01/02/2014 15:21    ASSESSMENT:  #1. 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 No evidence of malignancy or tuberculosis. #2. Hypertension, controlled.   PLAN:  #1. Chest x-ray PA and lateral today  showed no evidence of hilar or mediastinal adenopathy. #2. ACE level along with toxoplasma antibodies. #3. Followup in 6 months with CBC, chem profile, LDH, and ACE level.   All questions were answered. The patient knows to call the clinic with any problems, questions or concerns. We can certainly see the patient much sooner if necessary.   I spent 25 minutes counseling the patient face to face. The total time spent in the appointment was 30 minutes.    Doroteo Bradford, MD 01/13/2014 4:02 PM  DISCLAIMER:  This note was dictated with voice recognition software.  Similar sounding words can inadvertently be transcribed inaccurately and may not be corrected upon review.

## 2014-01-14 LAB — ANGIOTENSIN CONVERTING ENZYME: Angiotensin-Converting Enzyme: 28 U/L (ref 8–52)

## 2014-01-14 LAB — TOXOPLASMA ANTIBODIES- IGG AND  IGM
Toxoplasma Antibody- IgM: <3 IU/mL (ref <8.0)
Toxoplasma IgG Ratio: <3 IU/mL (ref <7.2)

## 2014-04-10 ENCOUNTER — Ambulatory Visit (INDEPENDENT_AMBULATORY_CARE_PROVIDER_SITE_OTHER): Payer: PRIVATE HEALTH INSURANCE | Admitting: Family Medicine

## 2014-04-10 ENCOUNTER — Encounter: Payer: Self-pay | Admitting: Family Medicine

## 2014-04-10 VITALS — BP 136/88 | Ht 66.0 in | Wt 205.0 lb

## 2014-04-10 DIAGNOSIS — I10 Essential (primary) hypertension: Secondary | ICD-10-CM

## 2014-04-10 MED ORDER — ASPIRIN 81 MG PO TABS: 81.0000 mg | ORAL_TABLET | Freq: Every day | ORAL | Status: DC

## 2014-04-10 MED ORDER — LOSARTAN POTASSIUM 50 MG PO TABS: 50.0000 mg | ORAL_TABLET | Freq: Every morning | ORAL | Status: DC

## 2014-04-10 NOTE — Patient Instructions (Signed)
DASH Eating Plan °DASH stands for "Dietary Approaches to Stop Hypertension." The DASH eating plan is a healthy eating plan that has been shown to reduce high blood pressure (hypertension). Additional health benefits may include reducing the risk of type 2 diabetes mellitus, heart disease, and stroke. The DASH eating plan may also help with weight loss. °WHAT DO I NEED TO KNOW ABOUT THE DASH EATING PLAN? °For the DASH eating plan, you will follow these general guidelines: °· Choose foods with a percent daily value for sodium of less than 5% (as listed on the food label). °· Use salt-free seasonings or herbs instead of table salt or sea salt. °· Check with your health care provider or pharmacist before using salt substitutes. °· Eat lower-sodium products, often labeled as "lower sodium" or "no salt added." °· Eat fresh foods. °· Eat more vegetables, fruits, and low-fat dairy products. °· Choose whole grains. Look for the word "whole" as the first word in the ingredient list. °· Choose fish and skinless chicken or turkey more often than red meat. Limit fish, poultry, and meat to 6 oz (170 g) each day. °· Limit sweets, desserts, sugars, and sugary drinks. °· Choose heart-healthy fats. °· Limit cheese to 1 oz (28 g) per day. °· Eat more home-cooked food and less restaurant, buffet, and fast food. °· Limit fried foods. °· Cook foods using methods other than frying. °· Limit canned vegetables. If you do use them, rinse them well to decrease the sodium. °· When eating at a restaurant, ask that your food be prepared with less salt, or no salt if possible. °WHAT FOODS CAN I EAT? °Seek help from a dietitian for individual calorie needs. °Grains °Whole grain or whole wheat bread. Brown rice. Whole grain or whole wheat pasta. Quinoa, bulgur, and whole grain cereals. Low-sodium cereals. Corn or whole wheat flour tortillas. Whole grain cornbread. Whole grain crackers. Low-sodium crackers. °Vegetables °Fresh or frozen vegetables  (raw, steamed, roasted, or grilled). Low-sodium or reduced-sodium tomato and vegetable juices. Low-sodium or reduced-sodium tomato sauce and paste. Low-sodium or reduced-sodium canned vegetables.  °Fruits °All fresh, canned (in natural juice), or frozen fruits. °Meat and Other Protein Products °Ground beef (85% or leaner), grass-fed beef, or beef trimmed of fat. Skinless chicken or turkey. Ground chicken or turkey. Pork trimmed of fat. All fish and seafood. Eggs. Dried beans, peas, or lentils. Unsalted nuts and seeds. Unsalted canned beans. °Dairy °Low-fat dairy products, such as skim or 1% milk, 2% or reduced-fat cheeses, low-fat ricotta or cottage cheese, or plain low-fat yogurt. Low-sodium or reduced-sodium cheeses. °Fats and Oils °Tub margarines without trans fats. Light or reduced-fat mayonnaise and salad dressings (reduced sodium). Avocado. Safflower, olive, or canola oils. Natural peanut or almond butter. °Other °Unsalted popcorn and pretzels. °The items listed above may not be a complete list of recommended foods or beverages. Contact your dietitian for more options. °WHAT FOODS ARE NOT RECOMMENDED? °Grains °White bread. White pasta. White rice. Refined cornbread. Bagels and croissants. Crackers that contain trans fat. °Vegetables °Creamed or fried vegetables. Vegetables in a cheese sauce. Regular canned vegetables. Regular canned tomato sauce and paste. Regular tomato and vegetable juices. °Fruits °Dried fruits. Canned fruit in light or heavy syrup. Fruit juice. °Meat and Other Protein Products °Fatty cuts of meat. Ribs, chicken wings, bacon, sausage, bologna, salami, chitterlings, fatback, hot dogs, bratwurst, and packaged luncheon meats. Salted nuts and seeds. Canned beans with salt. °Dairy °Whole or 2% milk, cream, half-and-half, and cream cheese. Whole-fat or sweetened yogurt. Full-fat   cheeses or blue cheese. Nondairy creamers and whipped toppings. Processed cheese, cheese spreads, or cheese  curds. °Condiments °Onion and garlic salt, seasoned salt, table salt, and sea salt. Canned and packaged gravies. Worcestershire sauce. Tartar sauce. Barbecue sauce. Teriyaki sauce. Soy sauce, including reduced sodium. Steak sauce. Fish sauce. Oyster sauce. Cocktail sauce. Horseradish. Ketchup and mustard. Meat flavorings and tenderizers. Bouillon cubes. Hot sauce. Tabasco sauce. Marinades. Taco seasonings. Relishes. °Fats and Oils °Butter, stick margarine, lard, shortening, ghee, and bacon fat. Coconut, palm kernel, or palm oils. Regular salad dressings. °Other °Pickles and olives. Salted popcorn and pretzels. °The items listed above may not be a complete list of foods and beverages to avoid. Contact your dietitian for more information. °WHERE CAN I FIND MORE INFORMATION? °National Heart, Lung, and Blood Institute: www.nhlbi.nih.gov/health/health-topics/topics/dash/ °Document Released: 05/12/2011 Document Revised: 10/07/2013 Document Reviewed: 03/27/2013 °ExitCare® Patient Information ©2015 [employer]. This information is not intended to replace advice given to you by your health care provider. Make sure you discuss any questions you have with your health care provider. ° °

## 2014-04-10 NOTE — Progress Notes (Signed)
   Subjective:    Patient ID: Patricia Fuller, female    DOB: 23-Dec-1950, 63 y.o.   MRN: [id number]  Hypertension This is a chronic problem. The current episode started more than 1 year ago. Treatments tried: losartan. Compliance problems include exercise and diet.   pt brought in copy of bloodwork.  declines flu vaccine No concerns. PMH benign Review of Systems Denies chest tightness pressure pain shortness of breath.    Objective:   Physical Exam  Lungs clear heart regular pulse normal blood pressure is good extremities no edema neurologic grossly normal      Assessment & Plan:  HTN decent control continue current measures  Mild hyperlipidemia but not high enough to be on medication her percentage risk rates out at less than 5%  start81 mg aspirin for cardioprotective measures

## 2014-07-14 ENCOUNTER — Other Ambulatory Visit (HOSPITAL_COMMUNITY): Payer: Self-pay

## 2014-07-14 DIAGNOSIS — R590 Localized enlarged lymph nodes: Secondary | ICD-10-CM

## 2014-07-16 ENCOUNTER — Other Ambulatory Visit (HOSPITAL_COMMUNITY): Payer: PRIVATE HEALTH INSURANCE

## 2014-07-16 ENCOUNTER — Ambulatory Visit (HOSPITAL_COMMUNITY): Payer: PRIVATE HEALTH INSURANCE

## 2014-07-16 ENCOUNTER — Encounter (HOSPITAL_COMMUNITY): Payer: PRIVATE HEALTH INSURANCE | Attending: Hematology & Oncology

## 2014-07-16 ENCOUNTER — Encounter (HOSPITAL_BASED_OUTPATIENT_CLINIC_OR_DEPARTMENT_OTHER): Payer: PRIVATE HEALTH INSURANCE | Admitting: Hematology & Oncology

## 2014-07-16 DIAGNOSIS — I10 Essential (primary) hypertension: Secondary | ICD-10-CM

## 2014-07-16 DIAGNOSIS — R590 Localized enlarged lymph nodes: Secondary | ICD-10-CM | POA: Diagnosis not present

## 2014-07-16 LAB — CBC WITH DIFFERENTIAL/PLATELET
Basophils Absolute: 0 10*3/uL (ref 0.0–0.1)
Basophils Relative: 1 % (ref 0–1)
EOS ABS: 0.2 10*3/uL (ref 0.0–0.7)
EOS PCT: 4 % (ref 0–5)
HEMATOCRIT: 43.6 % (ref 36.0–46.0)
Hemoglobin: 13.9 g/dL (ref 12.0–15.0)
Lymphocytes Relative: 36 % (ref 12–46)
Lymphs Abs: 2.1 10*3/uL (ref 0.7–4.0)
MCH: 29 pg (ref 26.0–34.0)
MCHC: 31.9 g/dL (ref 30.0–36.0)
MCV: 90.8 fL (ref 78.0–100.0)
MONO ABS: 0.4 10*3/uL (ref 0.1–1.0)
MONOS PCT: 8 % (ref 3–12)
Neutro Abs: 3 10*3/uL (ref 1.7–7.7)
Neutrophils Relative %: 51 % (ref 43–77)
Platelets: 203 10*3/uL (ref 150–400)
RBC: 4.8 MIL/uL (ref 3.87–5.11)
RDW: 13.6 % (ref 11.5–15.5)
WBC: 5.8 10*3/uL (ref 4.0–10.5)

## 2014-07-16 LAB — COMPREHENSIVE METABOLIC PANEL
ALT: 20 U/L (ref 0–35)
ANION GAP: 6 (ref 5–15)
AST: 21 U/L (ref 0–37)
Albumin: 4.2 g/dL (ref 3.5–5.2)
Alkaline Phosphatase: 92 U/L (ref 39–117)
BILIRUBIN TOTAL: 0.6 mg/dL (ref 0.3–1.2)
BUN: 17 mg/dL (ref 6–23)
CALCIUM: 9.6 mg/dL (ref 8.4–10.5)
CO2: 26 mmol/L (ref 19–32)
Chloride: 109 mmol/L (ref 96–112)
Creatinine, Ser: 0.78 mg/dL (ref 0.50–1.10)
GFR calc Af Amer: >90 mL/min (ref >90)
GFR calc non Af Amer: 88 mL/min — ABNORMAL LOW (ref >90)
Glucose, Bld: 99 mg/dL (ref 70–99)
POTASSIUM: 4 mmol/L (ref 3.5–5.1)
SODIUM: 141 mmol/L (ref 135–145)
Total Protein: 7.4 g/dL (ref 6.0–8.3)

## 2014-07-16 LAB — LACTATE DEHYDROGENASE: LDH: 139 U/L (ref 94–250)

## 2014-07-16 NOTE — Progress Notes (Signed)
LABS DRAWN

## 2014-07-16 NOTE — Progress Notes (Signed)
Sallee Lange, MD [place] [address]    DIAGNOSIS: Pelvic lymphadenopathy - Plan: Angiotensin converting enzyme, Sedimentation rate, CBC with Differential, Angiotensin converting enzyme, Sedimentation rate, CBC with Differential  Granulomatous lymphadenitis, non suppurative - Plan: Toxoplasma antibodies- IgG and IgM, DG Chest 2 View, Toxoplasma antibodies- IgG and IgM  CURRENT THERAPY: Observation  INTERVAL HISTORY: Patricia Fuller 64 y.o. female returns for follow-up of pelvic lymphadenopathy.  She denies any complaints.  She works 12 hours days at [employer].  No drenching nightsweats, no fever, no cough or other complaints.She underwent a biopsy of a right inguinal lymph node in July 2015 that was negative for malignancy.  MEDICAL HISTORY: Past Medical History  Diagnosis Date  . Hypertension   . Hyperlipidemia   . Prediabetes   . Renal calculi     has Essential hypertension, benign; Other and unspecified hyperlipidemia; Prediabetes; and Pelvic lymphadenopathy on her problem list.     has No Known Allergies.  Ms. Watters does not currently have medications on file.  SURGICAL HISTORY: Past Surgical History  Procedure Laterality Date  . Esophagogastroduodenoscopy  02/2005  . Colonoscopy  02/2005    small polyp    SOCIAL HISTORY: History   Social History  . Marital Status: Single    Spouse Name: N/A  . Number of Children: N/A  . Years of Education: N/A   Occupational History  . Not on file.   Social History Main Topics  . Smoking status: Never Smoker   . Smokeless tobacco: Not on file  . Alcohol Use: No  . Drug Use: No  . Sexual Activity: No   Other Topics Concern  . Not on file   Social History Narrative    FAMILY HISTORY: Family History  Problem Relation Age of Onset  . Hypertension Mother   . Diabetes Mother   . Heart disease Mother     Review of Systems  Constitutional: Negative for fever, chills, weight loss  and malaise/fatigue.  HENT: Negative for congestion, hearing loss, nosebleeds, sore throat and tinnitus.   Eyes: Negative for blurred vision, double vision, pain and discharge.  Respiratory: Negative for cough, hemoptysis, sputum production, shortness of breath and wheezing.   Cardiovascular: Negative for chest pain, palpitations, claudication, leg swelling and PND.  Gastrointestinal: Negative for heartburn, nausea, vomiting, abdominal pain, diarrhea, constipation, blood in stool and melena.  Genitourinary: Negative for dysuria, urgency, frequency and hematuria.  Musculoskeletal: Negative for myalgias, joint pain and falls.  Skin: Negative for itching and rash.  Neurological: Negative for dizziness, tingling, tremors, sensory change, speech change, focal weakness, seizures, loss of consciousness, weakness and headaches.  Endo/Heme/Allergies: Does not bruise/bleed easily.  Psychiatric/Behavioral: Negative for depression, suicidal ideas, memory loss and substance abuse. The patient is not nervous/anxious and does not have insomnia.     PHYSICAL EXAMINATION  ECOG PERFORMANCE STATUS: 0 - Asymptomatic  Filed Vitals:   07/16/14 0948  BP: 133/75  Pulse: 61  Temp: 97.7 F (36.5 C)  Resp: 18    Physical Exam  Constitutional: She is oriented to person, place, and time and well-developed, well-nourished, and in no distress.  HENT:  Head: Normocephalic and atraumatic.  Nose: Nose normal.  Mouth/Throat: Oropharynx is clear and moist. No oropharyngeal exudate.  Eyes: Conjunctivae and EOM are normal. Pupils are equal, round, and reactive to light. Right eye exhibits no discharge. Left eye exhibits no discharge. No scleral icterus.  Neck: Normal range of motion. Neck supple. No tracheal deviation  present. No thyromegaly present.  Cardiovascular: Normal rate, regular rhythm and normal heart sounds.  Exam reveals no gallop and no friction rub.   No murmur heard. Pulmonary/Chest: Effort normal and  breath sounds normal. She has no wheezes. She has no rales.  Abdominal: Soft. Bowel sounds are normal. She exhibits no distension and no mass. There is no tenderness. There is no rebound and no guarding.  Musculoskeletal: Normal range of motion. She exhibits no edema.  Lymphadenopathy:    She has no cervical adenopathy.  Neurological: She is alert and oriented to person, place, and time. She has normal reflexes. No cranial nerve deficit. Gait normal. Coordination normal.  Skin: Skin is warm and dry. No rash noted.  Psychiatric: Mood, memory, affect and judgment normal.  Nursing note and vitals reviewed.   LABORATORY DATA:  CBC    Component Value Date/Time   WBC 5.8 07/16/2014 1054   RBC 4.80 07/16/2014 1054   HGB 13.9 07/16/2014 1054   HCT 43.6 07/16/2014 1054   PLT 203 07/16/2014 1054   MCV 90.8 07/16/2014 1054   MCH 29.0 07/16/2014 1054   MCHC 31.9 07/16/2014 1054   RDW 13.6 07/16/2014 1054   LYMPHSABS 2.1 07/16/2014 1054   MONOABS 0.4 07/16/2014 1054   EOSABS 0.2 07/16/2014 1054   BASOSABS 0.0 07/16/2014 1054   CMP     Component Value Date/Time   NA 141 07/16/2014 1054   K 4.0 07/16/2014 1054   CL 109 07/16/2014 1054   CO2 26 07/16/2014 1054   GLUCOSE 99 07/16/2014 1054   BUN 17 07/16/2014 1054   CREATININE 0.78 07/16/2014 1054   CREATININE 0.83 08/25/2012 0902   CALCIUM 9.6 07/16/2014 1054   PROT 7.4 07/16/2014 1054   ALBUMIN 4.2 07/16/2014 1054   AST 21 07/16/2014 1054   ALT 20 07/16/2014 1054   ALKPHOS 92 07/16/2014 1054   BILITOT 0.6 07/16/2014 1054   GFRNONAA 88* 07/16/2014 1054   GFRAA >90 07/16/2014 1054     PENDING LABS:   RADIOGRAPHIC STUDIES:  No results found.   PATHOLOGY: PATHOLOGY:  for ALAYLA, DETHLEFS ([id number]) Patient: MAYSEL, MCCOLM Collected: 01/02/2014 Client: [facility] Accession: [id number] Received: 01/02/2014 John "Ronny Bacon, MD DOB: Jul 29, 1950 Age: 64 Gender: F Reported: 01/06/2014 [address] Patient Ph:  [phone] MRN #: [id number] [place], [place] [address] Visit #: [id number] Chart #: Phone: [phone] Fax: CC: Robynn Pane, PA-C REPORT OF SURGICAL PATHOLOGY FINAL DIAGNOSIS Diagnosis Lymph node, needle/core biopsy, right inguinal - NON-SUPPURATIVE GRANULOMATOUS LYMPHADENITIS, SEE COMMENT. - NO MORPHOLOGIC EVIDENCE OF LYMPHOMA OR MALIGNANCY. Diagnosis Note Core biopsies of lymph node reveal sinus histiocytosis, small clusters of histocytes and scattered granulomas. There is no necrosis or acute inflammation. PAS, GMS, and AFB are negative for organisms. These findings are benign and consistent with a non-suppurative granulomatous lymphadenitis. [name] MD Pathologist, Electronic Signature (Case signed 01/06/2014) Specimen Gross and Clinical Information Specimen(s) Obtained: Lymph node, needle/core biopsy, right inguinal Specimen Clinical Information Likely reactive shotty bilateral pelvic and inguinal LNs, not hypermetabolic on PET (tl) Gross Received in saline are five cores of tan gray soft tissue ranging from 0.3 x 0.1 cm to 2 x 0.1 cm. Half of two longer cores are held in RPMI for possible flow cytometry, and the remaining specimen is submitted in one block for routine histology. (SSW:kh 01-02-14) Stain(s) used in Diagnosis: The following stain(s) were used in diagnosing the case: *Acid Fast Bacillus Stain, *PAS/F Stain, GMS. The control(s) stained appropriately. Report signed out from the  following location(s) Technical Component performed at [facility]. [address] FINAL for Rector, Lubertha [phone]) Report signed out from the following location(s)(continued) [address] [address].JHER:[id number],HUD:[id number]., Technical Component performed at [facility], [place], [place] [address]. CLIA #: [id number], Interpretation performed at [facility], [place], [place] [address].  CLIA #: [id number],     ASSESSMENT and THERAPY PLAN:    Pelvic lymphadenopathy 64 year old female who presented with pelvic lymphadenopathy. She has no B symptoms. She is active she works daily and overall feels well. She underwent a biopsy of the pelvic lymph nodes in July 2015 that showed no evidence of malignancy. Given that she continues to do well, has no new palpable lymph nodes, and essentially is completely asymptomatic I have recommended follow-up with [place] as needed. She does have a primary care physician she follows with routinely. I have advised her if she has any significant concerns or problems such as new adenopathy, change in appetite, weight loss, fever, or night sweats to please call us and we can see her again. However I feel her adenopathy was benign and currently needs no additional follow-up with [place].     All questions were answered. The patient knows to call the clinic with any problems, questions or concerns. We can certainly see the patient much sooner if necessary.   Molli Hazard 07/31/2014

## 2014-07-16 NOTE — Patient Instructions (Signed)
[place] [facility] Discharge Instructions  RECOMMENDATIONS MADE BY THE CONSULTANT AND ANY TEST RESULTS WILL BE SENT TO YOUR REFERRING PHYSICIAN.  You do not need a scheduled return appointment. Continue to follow-up with Dr.Luking. We will see you if needed. Call us if any symptoms of drenching night sweats, fevers, or unexplained weight loss occur. If there are any abnormal test results from today we will call you.  Thank you for choosing [place] [facility] to provide your oncology and hematology care.  To afford each patient quality time with our provider, please arrive at least 15 minutes before your scheduled appointment time.    You need to re-schedule your appointment should you arrive 10 or more minutes late.  We strive to give you quality time with our providers, and arriving late affects you and other patients whose appointments are after yours.  Also, if you no show three or more times for appointments you may be dismissed from the clinic at the providers discretion.     Again, thank you for choosing [facility].  Our hope is that these requests will decrease the amount of time that you wait before being seen by our physicians.       _____________________________________________________________  Should you have questions after your visit to [facility], please contact our office at [phone] between the hours of 8:30 a.m. and 4:30 p.m.  Voicemails left after 4:30 p.m. will not be returned until the following business day.  For prescription refill requests, have your pharmacy contact our office.

## 2014-07-17 LAB — ANGIOTENSIN CONVERTING ENZYME: Angiotensin-Converting Enzyme: 30 U/L (ref 8–52)

## 2014-07-31 NOTE — Assessment & Plan Note (Signed)
64 year old female who presented with pelvic lymphadenopathy. She has no B symptoms. She is active she works daily and overall feels well. She underwent a biopsy of the pelvic lymph nodes in July 2015 that showed no evidence of malignancy. Given that she continues to do well, has no new palpable lymph nodes, and essentially is completely asymptomatic I have recommended follow-up with [place] as needed. She does have a primary care physician she follows with routinely. I have advised her if she has any significant concerns or problems such as new adenopathy, change in appetite, weight loss, fever, or night sweats to please call us and we can see her again. However I feel her adenopathy was benign and currently needs no additional follow-up with [place].

## 2014-08-04 ENCOUNTER — Encounter (HOSPITAL_COMMUNITY): Payer: Self-pay | Admitting: Hematology & Oncology

## 2014-08-15 ENCOUNTER — Telehealth (HOSPITAL_COMMUNITY): Payer: Self-pay

## 2014-08-15 NOTE — Telephone Encounter (Signed)
Patient notified that labs were ok.

## 2014-08-15 NOTE — Telephone Encounter (Signed)
-----   Message from Louis Meckel sent at 08/13/2014 11:50 AM EST ----- Regarding: Lab results Patient would like to find out her lab results.  Please call her at [phone]   Thanks

## 2014-08-21 ENCOUNTER — Ambulatory Visit (INDEPENDENT_AMBULATORY_CARE_PROVIDER_SITE_OTHER): Payer: PRIVATE HEALTH INSURANCE | Admitting: Nurse Practitioner

## 2014-08-21 ENCOUNTER — Encounter: Payer: Self-pay | Admitting: Nurse Practitioner

## 2014-08-21 VITALS — BP 122/86 | Ht 66.0 in | Wt 208.0 lb

## 2014-08-21 DIAGNOSIS — G629 Polyneuropathy, unspecified: Secondary | ICD-10-CM

## 2014-08-21 NOTE — Patient Instructions (Signed)
Coated aspirin 81 mg once daily Multivitamin for women over 50 Omega 3

## 2014-08-22 ENCOUNTER — Encounter: Payer: Self-pay | Admitting: Nurse Practitioner

## 2014-08-22 NOTE — Progress Notes (Signed)
Subjective:  Presents for c/o of both feet feeling "cold" at times first noticed about a month ago. Describes herself as cold natured. Works second shift. Symptoms are better when she is moving around at work. Feet particularly toes feel slightly numb,cold and tingling. Occasional throbbing sensation but no significant pain. More on the right foot. No back or leg pain. Was advised at last visit to take daily aspirin but did not start this.   Objective:   BP 122/86 mmHg  Ht _0  (1.676 m)  Wt 208 lb (94.348 kg)  BMI 33.59 kg/m2 NAD. Alert, oriented. Lungs clear. Heart RRR. Lower extremities: no edema; strong DP pulses; toes cool with normal capillary refill. Could not tolerate monofilament test due to very sensitive feet (this is normal for her). Sensation grossly intact. Recent Results (from the past 2160 hour(s))  CBC with Differential     Status: None   Collection Time: 07/16/14 10:54 AM  Result Value Ref Range   WBC 5.8 4.0 - 10.5 K/uL   RBC 4.80 3.87 - 5.11 MIL/uL   Hemoglobin 13.9 12.0 - 15.0 g/dL   HCT 43.6 36.0 - 46.0 %   MCV 90.8 78.0 - 100.0 fL   MCH 29.0 26.0 - 34.0 pg   MCHC 31.9 30.0 - 36.0 g/dL   RDW 13.6 11.5 - 15.5 %   Platelets 203 150 - 400 K/uL   Neutrophils Relative % 51 43 - 77 %   Neutro Abs 3.0 1.7 - 7.7 K/uL   Lymphocytes Relative 36 12 - 46 %   Lymphs Abs 2.1 0.7 - 4.0 K/uL   Monocytes Relative 8 3 - 12 %   Monocytes Absolute 0.4 0.1 - 1.0 K/uL   Eosinophils Relative 4 0 - 5 %   Eosinophils Absolute 0.2 0.0 - 0.7 K/uL   Basophils Relative 1 0 - 1 %   Basophils Absolute 0.0 0.0 - 0.1 K/uL  Comprehensive metabolic panel     Status: Abnormal   Collection Time: 07/16/14 10:54 AM  Result Value Ref Range   Sodium 141 135 - 145 mmol/L   Potassium 4.0 3.5 - 5.1 mmol/L   Chloride 109 96 - 112 mmol/L   CO2 26 19 - 32 mmol/L   Glucose, Bld 99 70 - 99 mg/dL   BUN 17 6 - 23 mg/dL   Creatinine, Ser 0.78 0.50 - 1.10 mg/dL   Calcium 9.6 8.4 - 10.5 mg/dL   Total  Protein 7.4 6.0 - 8.3 g/dL   Albumin 4.2 3.5 - 5.2 g/dL   AST 21 0 - 37 U/L   ALT 20 0 - 35 U/L   Alkaline Phosphatase 92 39 - 117 U/L   Total Bilirubin 0.6 0.3 - 1.2 mg/dL   GFR calc non Af Amer 88 (L) >90 mL/min   GFR calc Af Amer >90 >90 mL/min    Comment: (NOTE) The eGFR has been calculated using the CKD EPI equation. This calculation has not been validated in all clinical situations. eGFR's persistently <90 mL/min signify possible Chronic Kidney Disease.    Anion gap 6 5 - 15  Lactate dehydrogenase     Status: None   Collection Time: 07/16/14 10:54 AM  Result Value Ref Range   LDH 139 94 - 250 U/L  Angiotensin converting enzyme     Status: None   Collection Time: 07/16/14 10:54 AM  Result Value Ref Range   Angiotensin-Converting Enzyme 30 8 - 52 U/L    Comment: Performed at [facility]      Assessment: Peripheral neuropathy  Plan: Coated aspirin 81 mg once daily Multivitamin for women over 50 Omega 3 Patient defers further work up or labs at this time. To call back if worsens or persists.

## 2014-09-08 ENCOUNTER — Telehealth: Payer: Self-pay

## 2014-09-08 DIAGNOSIS — G629 Polyneuropathy, unspecified: Secondary | ICD-10-CM

## 2014-09-08 NOTE — Telephone Encounter (Signed)
Please order bloodwork. Patient has appointment next week for numbness and tingling in her feet. She will report to [facility] Thursday morning for this bloodwork.

## 2014-09-08 NOTE — Telephone Encounter (Signed)
B12, hemoglobin A1c, glucose-peripheral neuropathy as diagnosis

## 2014-09-08 NOTE — Telephone Encounter (Signed)
Blood work has been ordered.

## 2014-09-12 LAB — HEMOGLOBIN A1C
ESTIMATED AVERAGE GLUCOSE: 123 mg/dL
Hgb A1c MFr Bld: 5.9 % — ABNORMAL HIGH (ref 4.8–5.6)

## 2014-09-12 LAB — VITAMIN B12: Vitamin B-12: 274 pg/mL (ref 211–946)

## 2014-09-12 LAB — GLUCOSE, RANDOM: Glucose: 96 mg/dL (ref 65–99)

## 2014-09-16 ENCOUNTER — Ambulatory Visit (INDEPENDENT_AMBULATORY_CARE_PROVIDER_SITE_OTHER): Payer: PRIVATE HEALTH INSURANCE | Admitting: Family Medicine

## 2014-09-16 ENCOUNTER — Encounter: Payer: Self-pay | Admitting: Family Medicine

## 2014-09-16 VITALS — BP 128/88 | Ht 66.0 in | Wt 206.0 lb

## 2014-09-16 DIAGNOSIS — G629 Polyneuropathy, unspecified: Secondary | ICD-10-CM | POA: Diagnosis not present

## 2014-09-16 MED ORDER — LOSARTAN POTASSIUM 50 MG PO TABS: 50.0000 mg | ORAL_TABLET | Freq: Every morning | ORAL | Status: DC

## 2014-09-16 NOTE — Patient Instructions (Signed)
Take B 12 1 to 2,000 mcg daily  If worse call Follow up in 6 months

## 2014-09-16 NOTE — Progress Notes (Signed)
   Subjective:    Patient ID: Patricia Fuller, female    DOB: July 16, 1950, 64 y.o.   MRN: [id number]  HPI Patient arrives to discuss numbness and tingling in feet and to discuss recent related blood work. Patient relates numbness tingling in the feet she notices this more in the evening time when she goes to bed at nighttime.  She denies any weakness in her legs denies any shooting pains no lumbar pain no swelling in her legs. She does have history hypertension she had recent testing done which showed normal thyroid function but showed that to B12 level was in the low normal and did show prediabetes Review of Systems  Constitutional: Negative for activity change, appetite change and fatigue.  HENT: Negative for congestion.   Respiratory: Negative for cough.   Cardiovascular: Negative for chest pain.  Gastrointestinal: Negative for abdominal pain.  Endocrine: Negative for polydipsia and polyphagia.  Neurological: Negative for weakness.  Psychiatric/Behavioral: Negative for confusion.       Objective:   Physical Exam  Constitutional: She appears well-nourished. No distress.  Cardiovascular: Normal rate, regular rhythm and normal heart sounds.   No murmur heard. Pulmonary/Chest: Effort normal and breath sounds normal. No respiratory distress.  Musculoskeletal: She exhibits no edema.  Lymphadenopathy:    She has no cervical adenopathy.  Neurological: She is alert. She exhibits normal muscle tone.  Psychiatric: Her behavior is normal.  Vitals reviewed.  Diabetic foot exam was completed near or sensory overall good a couple spots she could not feel the monofilament       Assessment & Plan:  Prediabetes watch diet closely keep under good control follow-up in the fall  Blood pressure good refills given follow-up in the fall  Peripheral neuropathy possibly related to B12 she is to take 2000 g B12 daily if her neuropathy is not improving over the next several months consider peripheral  nerve conduction test

## 2014-11-07 ENCOUNTER — Encounter: Payer: Self-pay | Admitting: Obstetrics and Gynecology

## 2015-07-28 IMAGING — US US BIOPSY
1 series · 13 of 18 positions shown · non-contrast
Comparison: CT the abdomen pelvis - 11/26/2013;

INDICATION: No known primary malignancy with non hypermetabolic shotty
borderline enlarged bilateral pelvic and inguinal lymph nodes.
Please perform ultrasound-guided biopsy for tissue diagnostic
purposes.

EXAM:
ULTRASOUND-GUIDED RIGHT INGUINAL LYMPH NODE BIOPSY
TECHNIQUE: Informed written consent was obtained from the patient after a
discussion of the risks, benefits and alternatives to treatment.
Questions regarding the procedure were encouraged and answered.
Initial ultrasound scanning demonstrated shotty, non pathologically
enlarged bilateral inguinal lymph nodes correlating with the
findings seen on recently performed PET and abdominal CT scans. An
ultrasound image was saved for documentation purposes. The right
inguinal lymph node was selected for biopsy. The procedure was
planned. A timeout was performed prior to the initiation of the
procedure.

[Series 1: us biopsy · 0.09mm/px · 13 of 18 slices shown]
[im 1/18]
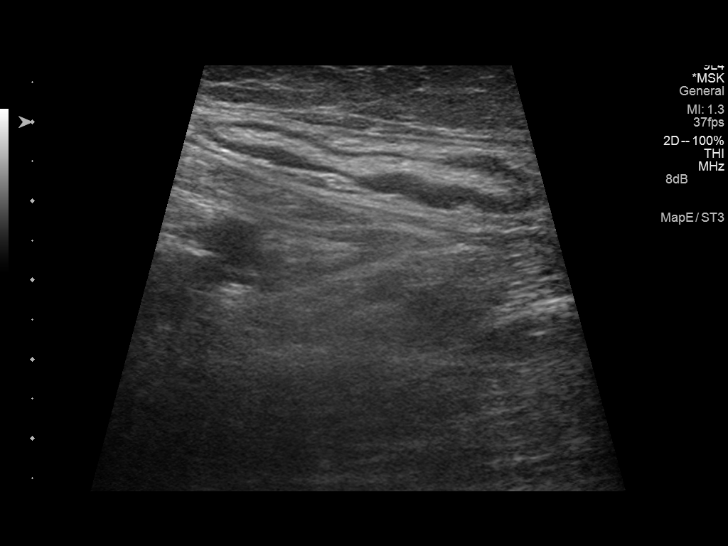
[im 3/18]
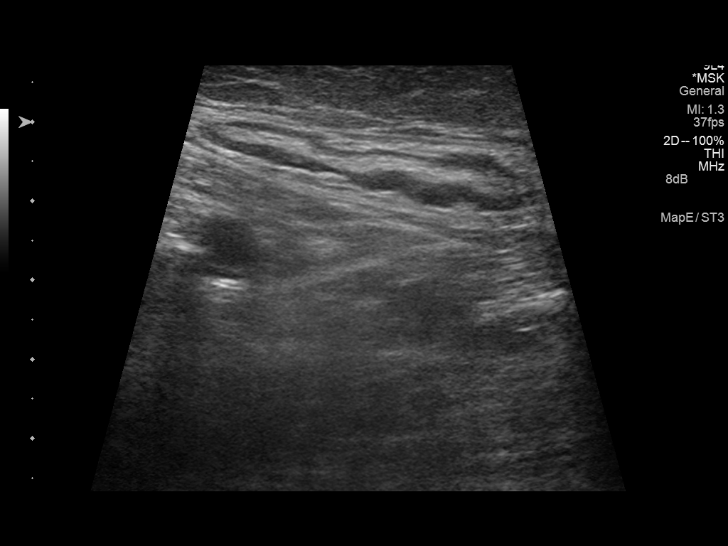
[im 4/18]
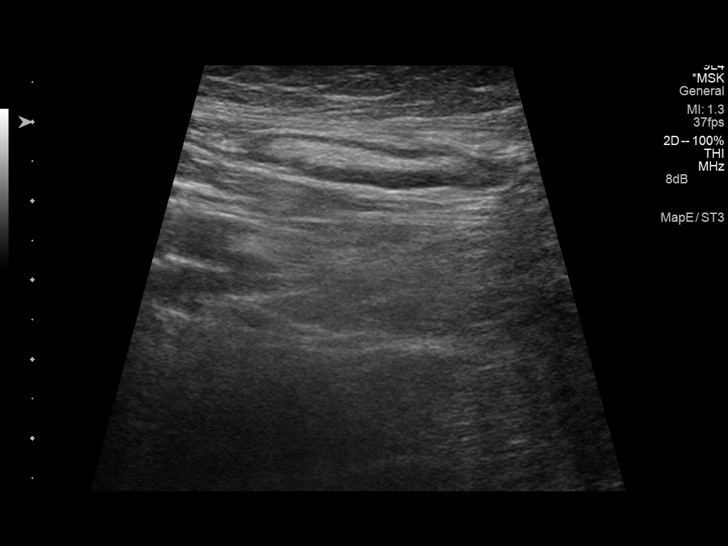
[im 5/18]
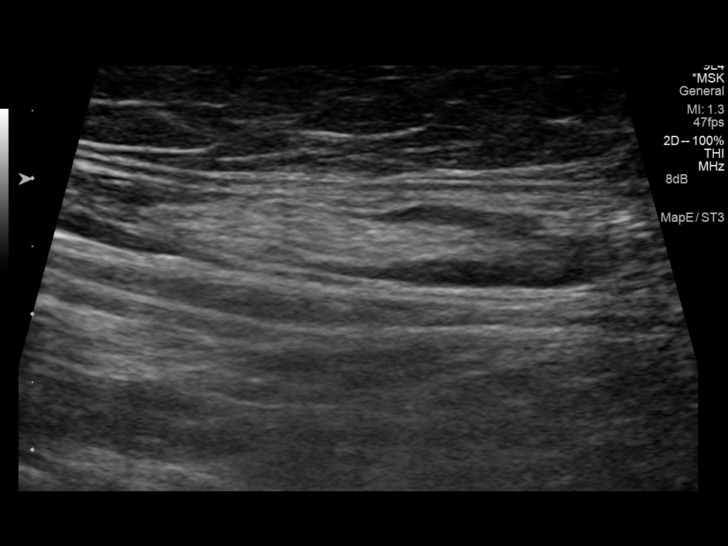
[im 7/18]
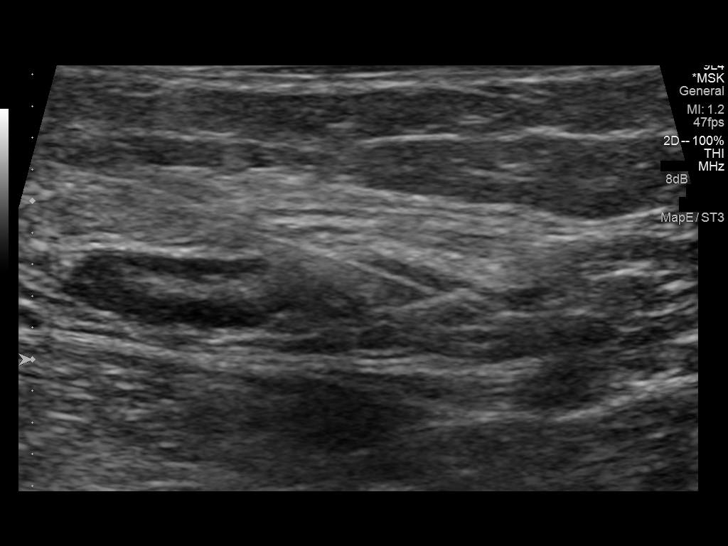
[im 8/18]
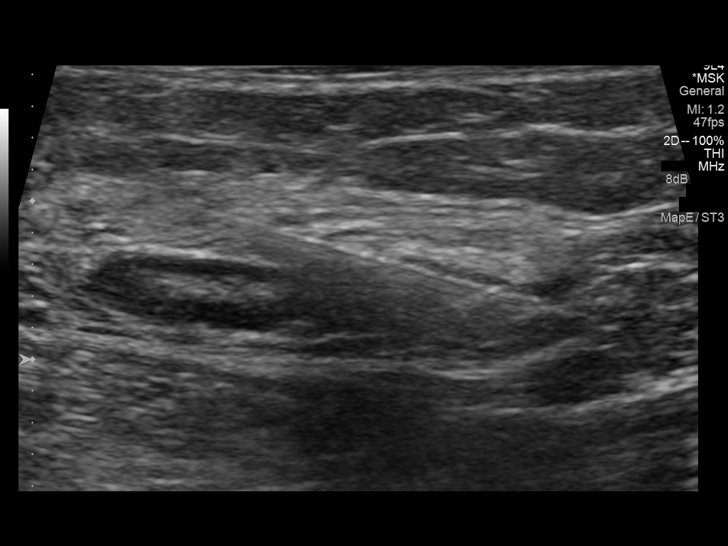
[im 10/18]
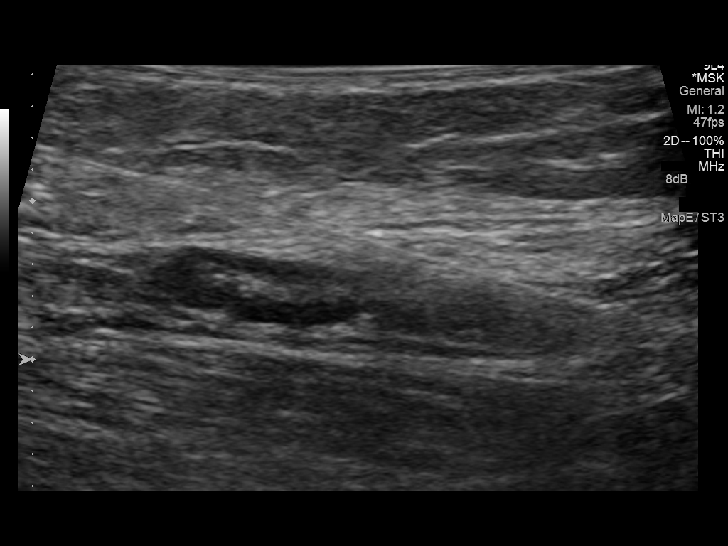
[im 11/18]
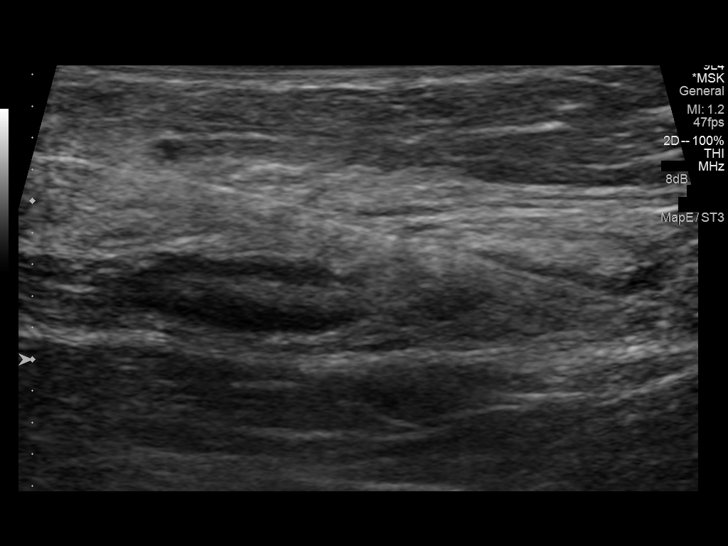
[im 12/18]
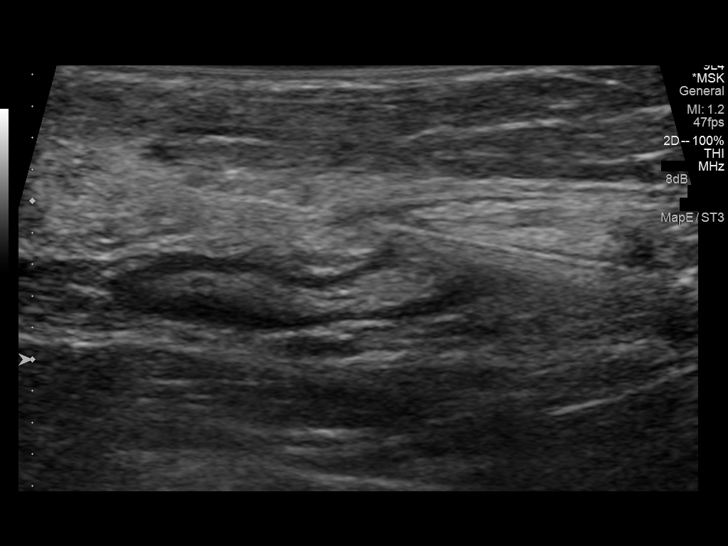
[im 14/18]
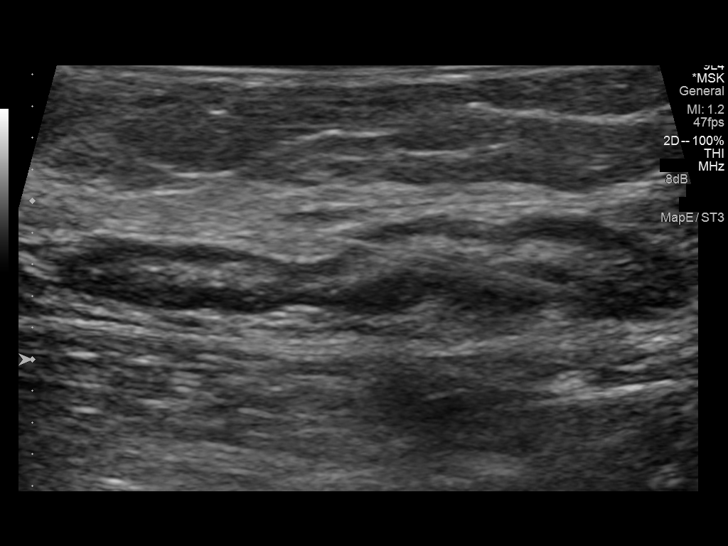
[im 15/18]
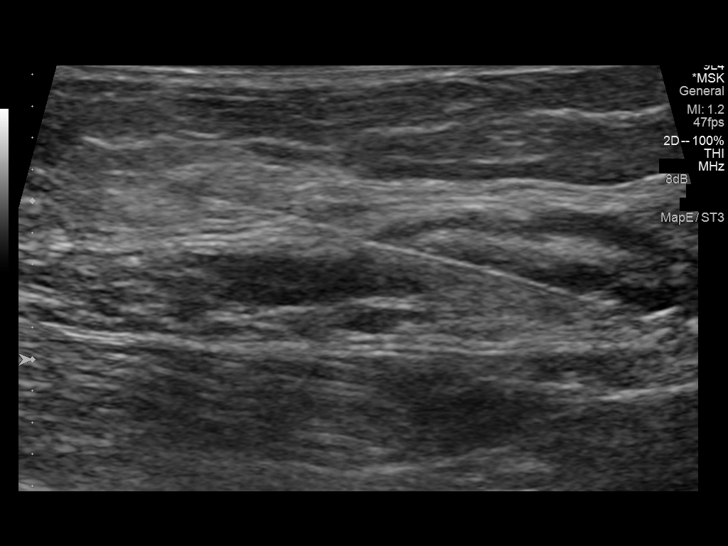
[im 16/18]
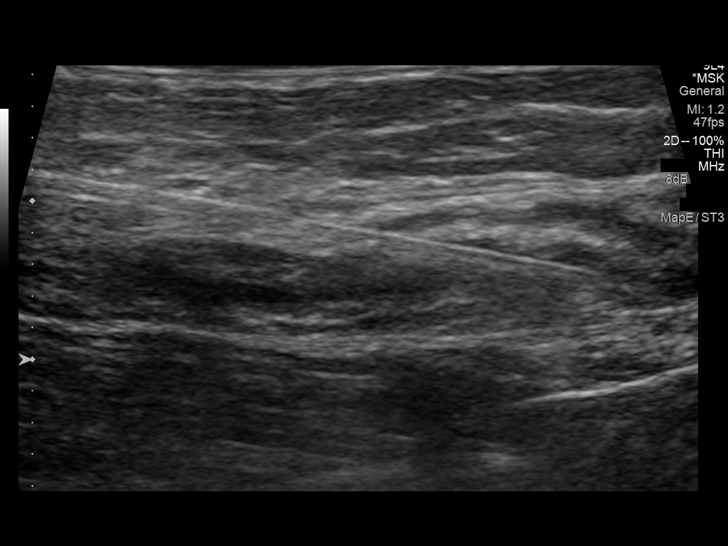
[im 18/18]
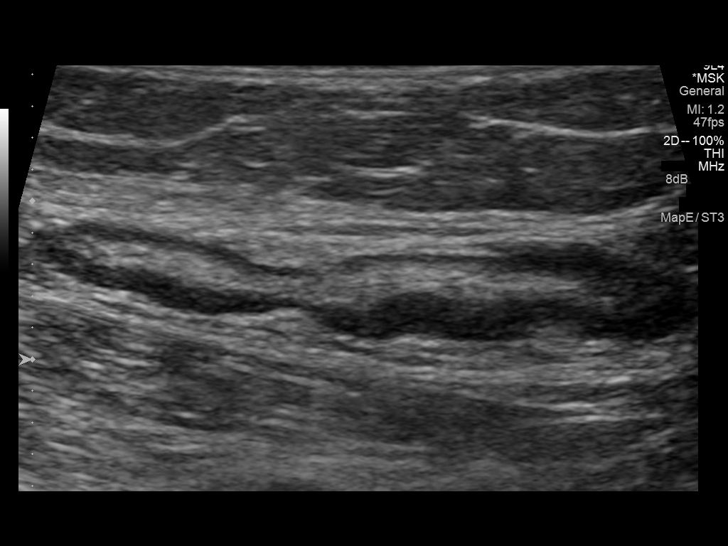

[13 of 18 positions shown; findings below may reference images not displayed]

PET-CT - 12/25/2013

MEDICATIONS:
Fentanyl 100 mcg IV; Versed 3 mg IV

ANESTHESIA/SEDATION:
Total Moderate Sedation time

13 minutes

COMPLICATIONS:
None immediate
The operative was prepped and draped in the usual sterile fashion,
and a sterile drape was applied covering the operative field. A
timeout was performed prior to the initiation of the procedure.
Local anesthesia was provided with 1% lidocaine with epinephrine.

Under direct ultrasound guidance, an 18 gauge core needle device was
utilized to obtain to obtain 5 core needle biopsied of the dominant
right inguinal lymph node.

The samples were placed in saline and submitted to pathology. The
needle was removed and hemostasis was achieved with manual
compression. Post procedure scan was negative for significant
hematoma. A dressing was placed. The patient tolerated the procedure
well without immediate postprocedural complication.
IMPRESSION: Technically successful ultrasound guided right inguinal lymph node
core needle biopsy.

## 2015-08-08 IMAGING — CR DG CHEST 2V
2 series · 2 of 2 positions shown · non-contrast
Comparison: PET-CT 12/25/2013.

CLINICAL DATA: Hypertension. Evaluate for sarcoidosis. History of
the inguinal nodal biopsy demonstrating granulomatous lymphadenitis.

EXAM:
CHEST  2 VIEW

[view not recorded (1 of 2)]
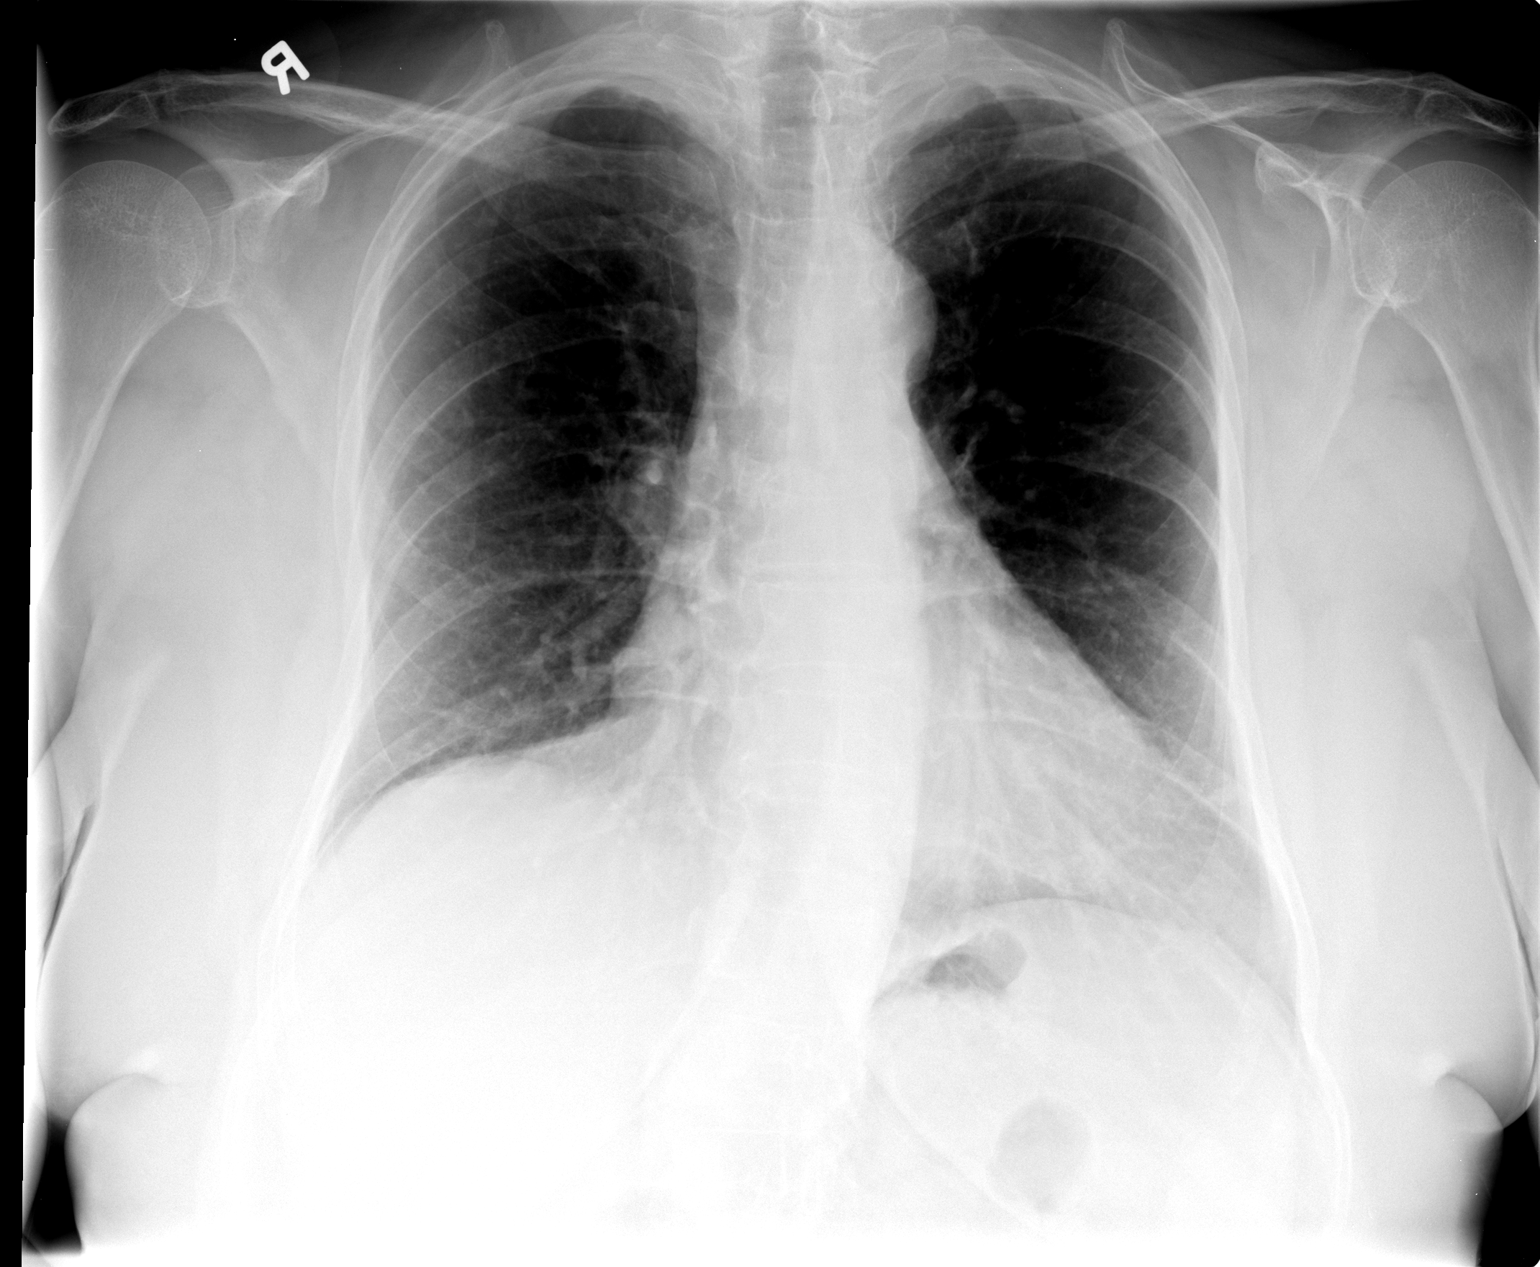

[view not recorded (2 of 2)]
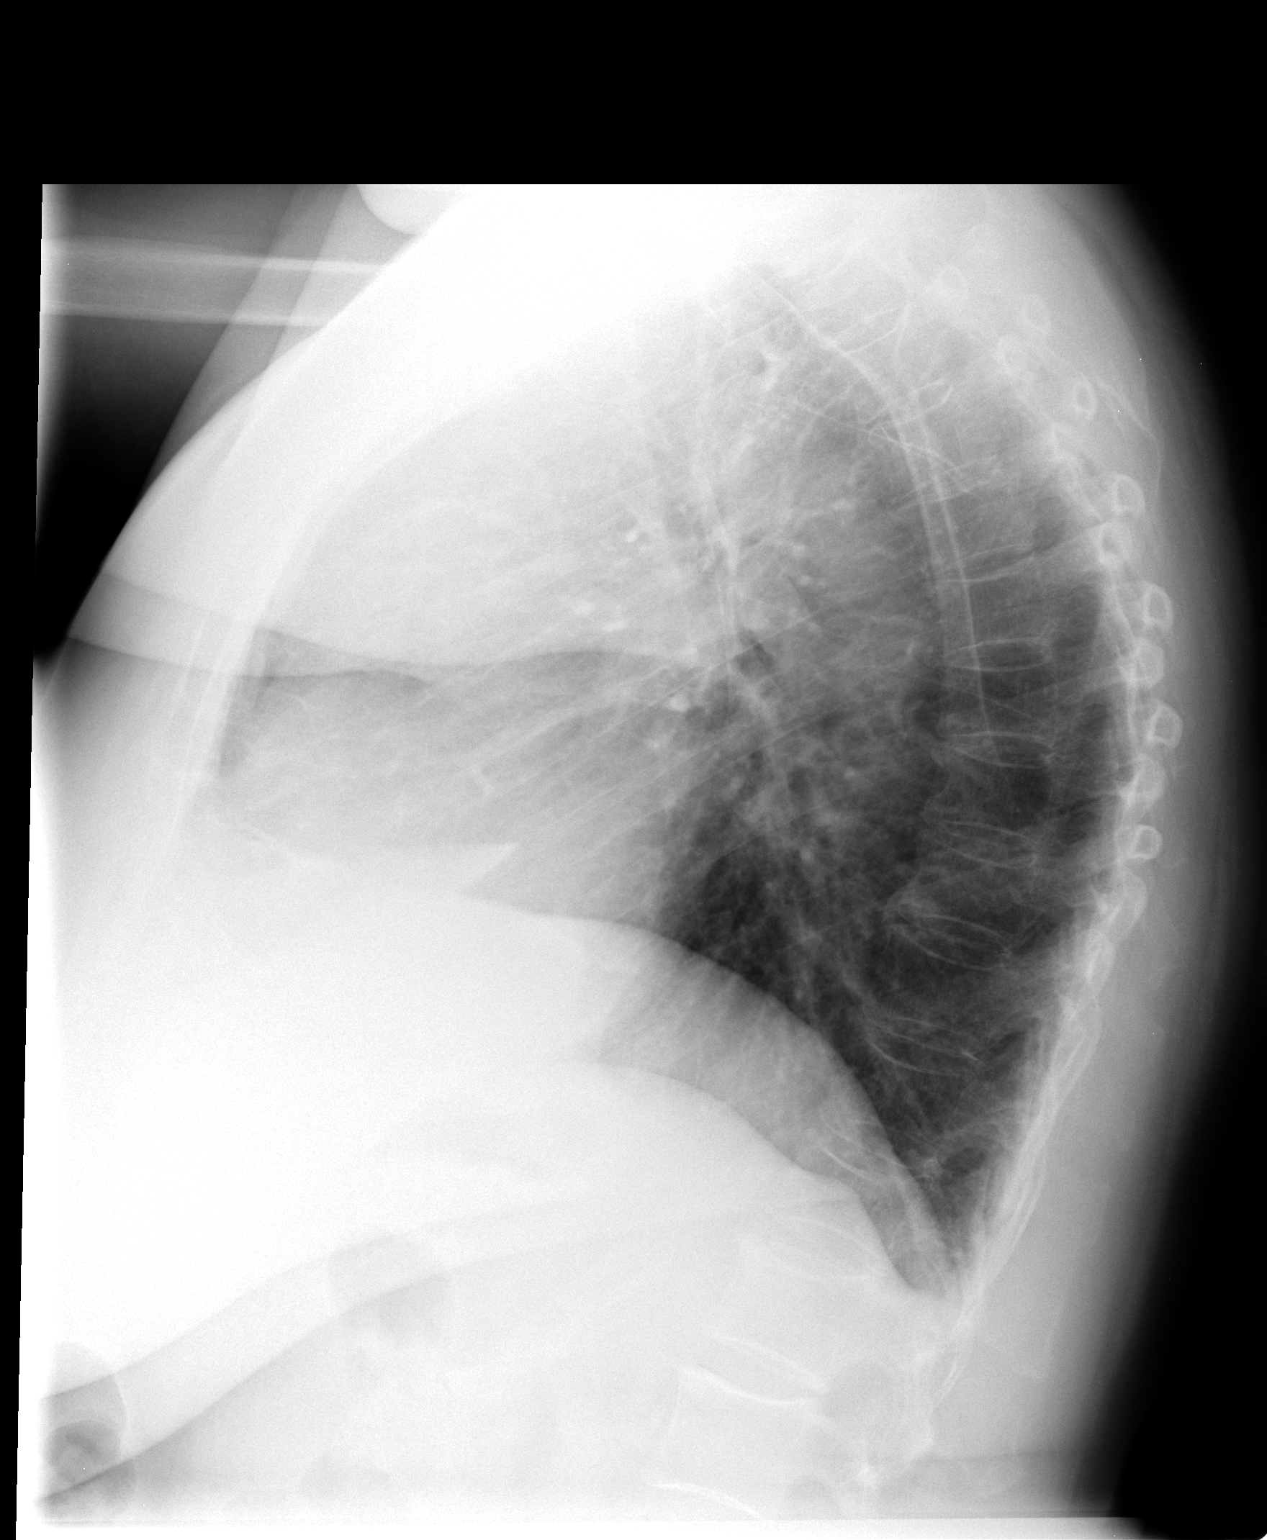

[2 of 2 positions shown; findings below may reference images not displayed]

FINDINGS: The heart size and mediastinal contours are normal without evidence
of adenopathy. The lungs are clear. There is stable mild biapical
pleural thickening. There is no pleural effusion. The osseous
structures appear normal.
IMPRESSION: Stable appearance of the chest. No acute process or signs of
sarcoidosis identified. There were no specific signs of thoracic
sarcoidosis on prior PET-CT.

## 2015-08-26 ENCOUNTER — Encounter: Payer: Self-pay | Admitting: Family Medicine

## 2015-08-26 ENCOUNTER — Ambulatory Visit (INDEPENDENT_AMBULATORY_CARE_PROVIDER_SITE_OTHER): Payer: BLUE CROSS/BLUE SHIELD | Admitting: Family Medicine

## 2015-08-26 VITALS — BP 128/82 | Ht 66.0 in | Wt 218.0 lb

## 2015-08-26 DIAGNOSIS — E785 Hyperlipidemia, unspecified: Secondary | ICD-10-CM | POA: Diagnosis not present

## 2015-08-26 DIAGNOSIS — I10 Essential (primary) hypertension: Secondary | ICD-10-CM

## 2015-08-26 DIAGNOSIS — R739 Hyperglycemia, unspecified: Secondary | ICD-10-CM | POA: Diagnosis not present

## 2015-08-26 DIAGNOSIS — M170 Bilateral primary osteoarthritis of knee: Secondary | ICD-10-CM | POA: Diagnosis not present

## 2015-08-26 MED ORDER — LOSARTAN POTASSIUM 50 MG PO TABS: 50.0000 mg | ORAL_TABLET | Freq: Every morning | ORAL | Status: DC

## 2015-08-26 NOTE — Patient Instructions (Signed)
DASH Eating Plan  DASH stands for "Dietary Approaches to Stop Hypertension." The DASH eating plan is a healthy eating plan that has been shown to reduce high blood pressure (hypertension). Additional health benefits may include reducing the risk of type 2 diabetes mellitus, heart disease, and stroke. The DASH eating plan may also help with weight loss.  WHAT DO I NEED TO KNOW ABOUT THE DASH EATING PLAN?  For the DASH eating plan, you will follow these general guidelines:  · Choose foods with a percent daily value for sodium of less than 5% (as listed on the food label).  · Use salt-free seasonings or herbs instead of table salt or sea salt.  · Check with your health care provider or pharmacist before using salt substitutes.  · Eat lower-sodium products, often labeled as "lower sodium" or "no salt added."  · Eat fresh foods.  · Eat more vegetables, fruits, and low-fat dairy products.  · Choose whole grains. Look for the word "whole" as the first word in the ingredient list.  · Choose fish and skinless chicken or turkey more often than red meat. Limit fish, poultry, and meat to 6 oz (170 g) each day.  · Limit sweets, desserts, sugars, and sugary drinks.  · Choose heart-healthy fats.  · Limit cheese to 1 oz (28 g) per day.  · Eat more home-cooked food and less restaurant, buffet, and fast food.  · Limit fried foods.  · Cook foods using methods other than frying.  · Limit canned vegetables. If you do use them, rinse them well to decrease the sodium.  · When eating at a restaurant, ask that your food be prepared with less salt, or no salt if possible.  WHAT FOODS CAN I EAT?  Seek help from a dietitian for individual calorie needs.  Grains  Whole grain or whole wheat bread. Brown rice. Whole grain or whole wheat pasta. Quinoa, bulgur, and whole grain cereals. Low-sodium cereals. Corn or whole wheat flour tortillas. Whole grain cornbread. Whole grain crackers. Low-sodium crackers.  Vegetables  Fresh or frozen vegetables  (raw, steamed, roasted, or grilled). Low-sodium or reduced-sodium tomato and vegetable juices. Low-sodium or reduced-sodium tomato sauce and paste. Low-sodium or reduced-sodium canned vegetables.   Fruits  All fresh, canned (in natural juice), or frozen fruits.  Meat and Other Protein Products  Ground beef (85% or leaner), grass-fed beef, or beef trimmed of fat. Skinless chicken or turkey. Ground chicken or turkey. Pork trimmed of fat. All fish and seafood. Eggs. Dried beans, peas, or lentils. Unsalted nuts and seeds. Unsalted canned beans.  Dairy  Low-fat dairy products, such as skim or 1% milk, 2% or reduced-fat cheeses, low-fat ricotta or cottage cheese, or plain low-fat yogurt. Low-sodium or reduced-sodium cheeses.  Fats and Oils  Tub margarines without trans fats. Light or reduced-fat mayonnaise and salad dressings (reduced sodium). Avocado. Safflower, olive, or canola oils. Natural peanut or almond butter.  Other  Unsalted popcorn and pretzels.  The items listed above may not be a complete list of recommended foods or beverages. Contact your dietitian for more options.  WHAT FOODS ARE NOT RECOMMENDED?  Grains  White bread. White pasta. White rice. Refined cornbread. Bagels and croissants. Crackers that contain trans fat.  Vegetables  Creamed or fried vegetables. Vegetables in a cheese sauce. Regular canned vegetables. Regular canned tomato sauce and paste. Regular tomato and vegetable juices.  Fruits  Dried fruits. Canned fruit in light or heavy syrup. Fruit juice.  Meat and Other Protein   Products  Fatty cuts of meat. Ribs, chicken wings, bacon, sausage, bologna, salami, chitterlings, fatback, hot dogs, bratwurst, and packaged luncheon meats. Salted nuts and seeds. Canned beans with salt.  Dairy  Whole or 2% milk, cream, half-and-half, and cream cheese. Whole-fat or sweetened yogurt. Full-fat cheeses or blue cheese. Nondairy creamers and whipped toppings. Processed cheese, cheese spreads, or cheese  curds.  Condiments  Onion and garlic salt, seasoned salt, table salt, and sea salt. Canned and packaged gravies. Worcestershire sauce. Tartar sauce. Barbecue sauce. Teriyaki sauce. Soy sauce, including reduced sodium. Steak sauce. Fish sauce. Oyster sauce. Cocktail sauce. Horseradish. Ketchup and mustard. Meat flavorings and tenderizers. Bouillon cubes. Hot sauce. Tabasco sauce. Marinades. Taco seasonings. Relishes.  Fats and Oils  Butter, stick margarine, lard, shortening, ghee, and bacon fat. Coconut, palm kernel, or palm oils. Regular salad dressings.  Other  Pickles and olives. Salted popcorn and pretzels.  The items listed above may not be a complete list of foods and beverages to avoid. Contact your dietitian for more information.  WHERE CAN I FIND MORE INFORMATION?  National Heart, Lung, and Blood Institute: www.nhlbi.nih.gov/health/health-topics/topics/dash/     This information is not intended to replace advice given to you by your health care provider. Make sure you discuss any questions you have with your health care provider.     Document Released: 05/12/2011 Document Revised: 06/13/2014 Document Reviewed: 03/27/2013  Elsevier Interactive Patient Education ©2016 Elsevier Inc.

## 2015-08-26 NOTE — Progress Notes (Signed)
   Subjective:    Patient ID: Patricia Fuller, female    DOB: [birth year], 65 y.o.   MRN: [id number]  Hypertension This is a chronic problem. The current episode started more than 1 year ago. Pertinent negatives include no chest pain. Risk factors for coronary artery disease include post-menopausal state. Treatments tried: losartin. There are no compliance problems.    Patient states overall she feels pretty good she does get some aching in the knees. She states it makes it difficult for her to walk long distances. She requests a handicap sticker. She denies any chest tightness pressure pain shortness breath   Review of Systems  Constitutional: Negative for activity change, appetite change and fatigue.  HENT: Negative for congestion.   Respiratory: Negative for cough.   Cardiovascular: Negative for chest pain.  Gastrointestinal: Negative for abdominal pain.  Endocrine: Negative for polydipsia and polyphagia.  Neurological: Negative for weakness.  Psychiatric/Behavioral: Negative for confusion.       Objective:   Physical Exam  Constitutional: She appears well-nourished. No distress.  Cardiovascular: Normal rate, regular rhythm and normal heart sounds.   No murmur heard. Pulmonary/Chest: Effort normal and breath sounds normal. No respiratory distress.  Musculoskeletal: She exhibits no edema.  Lymphadenopathy:    She has no cervical adenopathy.  Neurological: She is alert. She exhibits normal muscle tone.  Psychiatric: Her behavior is normal.  Vitals reviewed.         Assessment & Plan:  This patient defers on mammogram defers on colonoscopy does not one any interventions. She does agree to checking her lab work. She will continue her medication we will follow her up again approximate 9 months she was encouraged to eat healthy and try to lose weight

## 2015-09-02 LAB — LIPID PANEL
CHOL/HDL RATIO: 3.9 ratio (ref 0.0–4.4)
Cholesterol, Total: 228 mg/dL — ABNORMAL HIGH (ref 100–199)
HDL: 59 mg/dL (ref >39)
LDL CALC: 118 mg/dL — AB (ref 0–99)
TRIGLYCERIDES: 253 mg/dL — AB (ref 0–149)
VLDL Cholesterol Cal: 51 mg/dL — ABNORMAL HIGH (ref 5–40)

## 2015-09-02 LAB — BASIC METABOLIC PANEL
BUN / CREAT RATIO: 21 (ref 11–26)
BUN: 16 mg/dL (ref 8–27)
CO2: 24 mmol/L (ref 18–29)
CREATININE: 0.76 mg/dL (ref 0.57–1.00)
Calcium: 9.9 mg/dL (ref 8.7–10.3)
Chloride: 103 mmol/L (ref 96–106)
GFR, EST AFRICAN AMERICAN: 97 mL/min/{1.73_m2} (ref >59)
GFR, EST NON AFRICAN AMERICAN: 84 mL/min/{1.73_m2} (ref >59)
GLUCOSE: 108 mg/dL — AB (ref 65–99)
Potassium: 4.3 mmol/L (ref 3.5–5.2)
SODIUM: 141 mmol/L (ref 134–144)

## 2015-09-02 LAB — HEMOGLOBIN A1C
Est. average glucose Bld gHb Est-mCnc: 120 mg/dL
Hgb A1c MFr Bld: 5.8 % — ABNORMAL HIGH (ref 4.8–5.6)

## 2015-09-06 ENCOUNTER — Encounter: Payer: Self-pay | Admitting: Family Medicine

## 2016-08-09 ENCOUNTER — Ambulatory Visit (INDEPENDENT_AMBULATORY_CARE_PROVIDER_SITE_OTHER): Payer: BLUE CROSS/BLUE SHIELD | Admitting: Family Medicine

## 2016-08-09 ENCOUNTER — Encounter: Payer: Self-pay | Admitting: Family Medicine

## 2016-08-09 VITALS — BP 118/78 | Temp 98.2°F | Ht 66.0 in | Wt 218.0 lb

## 2016-08-09 DIAGNOSIS — R7303 Prediabetes: Secondary | ICD-10-CM

## 2016-08-09 DIAGNOSIS — E784 Other hyperlipidemia: Secondary | ICD-10-CM | POA: Diagnosis not present

## 2016-08-09 DIAGNOSIS — I10 Essential (primary) hypertension: Secondary | ICD-10-CM | POA: Diagnosis not present

## 2016-08-09 DIAGNOSIS — J019 Acute sinusitis, unspecified: Secondary | ICD-10-CM | POA: Diagnosis not present

## 2016-08-09 DIAGNOSIS — E7849 Other hyperlipidemia: Secondary | ICD-10-CM

## 2016-08-09 MED ORDER — AMOXICILLIN 500 MG PO TABS: 500.0000 mg | ORAL_TABLET | Freq: Three times a day (TID) | ORAL | 1 refills | Status: DC

## 2016-08-09 MED ORDER — LOSARTAN POTASSIUM 50 MG PO TABS: 50.0000 mg | ORAL_TABLET | Freq: Every morning | ORAL | 11 refills | Status: DC

## 2016-08-09 NOTE — Progress Notes (Signed)
   Subjective:    Patient ID: Patricia Fuller, female    DOB: 04-May-1951, 66 y.o.   MRN: [id number]  Hypertension  This is a chronic problem. The problem is unchanged. Associated symptoms include headaches. Pertinent negatives include no chest pain. There are no associated agents to hypertension. Risk factors for coronary artery disease include sedentary lifestyle. Past treatments include angiotensin blockers (losartan). The current treatment provides mild improvement. Compliance problems include diet and exercise.  There is no history of chronic renal disease.  URI   This is a new problem. The current episode started in the past 7 days. The problem has been gradually worsening. There has been no fever. Associated symptoms include congestion, coughing, headaches, rhinorrhea and sinus pain. Pertinent negatives include no abdominal pain, chest pain or sore throat. She has tried nothing for the symptoms. The treatment provided no relief.   Sinus pressure for one week. Taking otc meds.  Patient relates under a fair amount of stress from being very busy between work and taking care of grandchildren  She has no interest in doing preventative health despite discussing the importance of mammograms and colonoscopies.   Review of Systems  HENT: Positive for congestion, rhinorrhea and sinus pain. Negative for sore throat.   Respiratory: Positive for cough.   Cardiovascular: Negative for chest pain.  Gastrointestinal: Negative for abdominal pain.  Neurological: Positive for headaches.       Objective:   Physical Exam  Constitutional: She appears well-nourished. No distress.  Cardiovascular: Normal rate, regular rhythm and normal heart sounds.   No murmur heard. Pulmonary/Chest: Effort normal and breath sounds normal. No respiratory distress.  Musculoskeletal: She exhibits no edema.  Lymphadenopathy:    She has no cervical adenopathy.  Neurological: She is alert. She exhibits normal muscle tone.    Psychiatric: Her behavior is normal.  Vitals reviewed.         Assessment & Plan:  Hypertension decent control patient to watch diet encouraged exercise minimize salt in diet.  Colonoscopy recommended patient defers  Patient has history of prediabetes minimize starches in diet as best as possible  Check lab work await the results  Rhinosinusitis antibiotics prescribed warning signs discussed  History hyperlipidemia recheck lab work watch diet stay active

## 2016-08-09 NOTE — Patient Instructions (Signed)
DASH Eating Plan DASH stands for "Dietary Approaches to Stop Hypertension." The DASH eating plan is a healthy eating plan that has been shown to reduce high blood pressure (hypertension). It may also reduce your risk for type 2 diabetes, heart disease, and stroke. The DASH eating plan may also help with weight loss. What are tips for following this plan? General guidelines   Avoid eating more than 2,300 mg (milligrams) of salt (sodium) a day. If you have hypertension, you may need to reduce your sodium intake to 1,500 mg a day.  Limit alcohol intake to no more than 1 drink a day for nonpregnant women and 2 drinks a day for men. One drink equals 12 oz of beer, 5 oz of wine, or 1 oz of hard liquor.  Work with your health care provider to maintain a healthy body weight or to lose weight. Ask what an ideal weight is for you.  Get at least 30 minutes of exercise that causes your heart to beat faster (aerobic exercise) most days of the week. Activities may include walking, swimming, or biking.  Work with your health care provider or diet and nutrition specialist (dietitian) to adjust your eating plan to your individual calorie needs. Reading food labels   Check food labels for the amount of sodium per serving. Choose foods with less than 5 percent of the Daily Value of sodium. Generally, foods with less than 300 mg of sodium per serving fit into this eating plan.  To find whole grains, look for the word "whole" as the first word in the ingredient list. Shopping   Buy products labeled as "low-sodium" or "no salt added."  Buy fresh foods. Avoid canned foods and premade or frozen meals. Cooking   Avoid adding salt when cooking. Use salt-free seasonings or herbs instead of table salt or sea salt. Check with your health care provider or pharmacist before using salt substitutes.  Do not fry foods. Cook foods using healthy methods such as baking, boiling, grilling, and broiling instead.  Cook with  heart-healthy oils, such as olive, canola, soybean, or sunflower oil. Meal planning    Eat a balanced diet that includes:  5 or more servings of fruits and vegetables each day. At each meal, try to fill half of your plate with fruits and vegetables.  Up to 6-8 servings of whole grains each day.  Less than 6 oz of lean meat, poultry, or fish each day. A 3-oz serving of meat is about the same size as a deck of cards. One egg equals 1 oz.  2 servings of low-fat dairy each day.  A serving of nuts, seeds, or beans 5 times each week.  Heart-healthy fats. Healthy fats called Omega-3 fatty acids are found in foods such as flaxseeds and coldwater fish, like sardines, salmon, and mackerel.  Limit how much you eat of the following:  Canned or prepackaged foods.  Food that is high in trans fat, such as fried foods.  Food that is high in saturated fat, such as fatty meat.  Sweets, desserts, sugary drinks, and other foods with added sugar.  Full-fat dairy products.  Do not salt foods before eating.  Try to eat at least 2 vegetarian meals each week.  Eat more home-cooked food and less restaurant, buffet, and fast food.  When eating at a restaurant, ask that your food be prepared with less salt or no salt, if possible. What foods are recommended? The items listed may not be a complete list. Talk   with your dietitian about what dietary choices are best for you. Grains  Whole-grain or whole-wheat bread. Whole-grain or whole-wheat pasta. Brown rice. [place]. Bulgur. Whole-grain and low-sodium cereals. Pita bread. Low-fat, low-sodium crackers. Whole-wheat flour tortillas. Vegetables  Fresh or frozen vegetables (raw, steamed, roasted, or grilled). Low-sodium or reduced-sodium tomato and vegetable juice. Low-sodium or reduced-sodium tomato sauce and tomato paste. Low-sodium or reduced-sodium canned vegetables. Fruits  All fresh, dried, or frozen fruit. Canned fruit in natural juice  (without added sugar). Meat and other protein foods  Skinless chicken or [place]. Ground chicken or [place]. Pork with fat trimmed off. Fish and seafood. Egg whites. Dried beans, peas, or lentils. Unsalted nuts, nut butters, and seeds. Unsalted canned beans. Lean cuts of beef with fat trimmed off. Low-sodium, lean deli meat. Dairy  Low-fat (1%) or fat-free (skim) milk. Fat-free, low-fat, or reduced-fat cheeses. Nonfat, low-sodium ricotta or cottage cheese. Low-fat or nonfat yogurt. Low-fat, low-sodium cheese. Fats and oils  Soft margarine without trans fats. Vegetable oil. Low-fat, reduced-fat, or light mayonnaise and salad dressings (reduced-sodium). Canola, safflower, olive, soybean, and sunflower oils. Avocado. Seasoning and other foods  Herbs. Spices. Seasoning mixes without salt. Unsalted popcorn and pretzels. Fat-free sweets. What foods are not recommended? The items listed may not be a complete list. Talk with your dietitian about what dietary choices are best for you. Grains  Baked goods made with fat, such as croissants, muffins, or some breads. Dry pasta or rice meal packs. Vegetables  Creamed or fried vegetables. Vegetables in a cheese sauce. Regular canned vegetables (not low-sodium or reduced-sodium). Regular canned tomato sauce and paste (not low-sodium or reduced-sodium). Regular tomato and vegetable juice (not low-sodium or reduced-sodium). Angie Fava. Olives. Fruits  Canned fruit in a light or heavy syrup. Fried fruit. Fruit in cream or butter sauce. Meat and other protein foods  Fatty cuts of meat. Ribs. Fried meat. Berniece Salines. Sausage. Bologna and other processed lunch meats. Salami. Fatback. Hotdogs. Bratwurst. Salted nuts and seeds. Canned beans with added salt. Canned or smoked fish. Whole eggs or egg yolks. Chicken or [place] with skin. Dairy  Whole or 2% milk, cream, and half-and-half. Whole or full-fat cream cheese. Whole-fat or sweetened yogurt. Full-fat cheese. Nondairy creamers.  Whipped toppings. Processed cheese and cheese spreads. Fats and oils  Butter. Stick margarine. Lard. Shortening. Ghee. Bacon fat. Tropical oils, such as coconut, palm kernel, or palm oil. Seasoning and other foods  Salted popcorn and pretzels. Onion salt, garlic salt, seasoned salt, table salt, and sea salt. Worcestershire sauce. Tartar sauce. Barbecue sauce. Teriyaki sauce. Soy sauce, including reduced-sodium. Steak sauce. Canned and packaged gravies. Fish sauce. Oyster sauce. Cocktail sauce. Horseradish that you find on the shelf. Ketchup. Mustard. Meat flavorings and tenderizers. Bouillon cubes. Hot sauce and Tabasco sauce. Premade or packaged marinades. Premade or packaged taco seasonings. Relishes. Regular salad dressings. Where to find more information:  National Heart, Lung, and [place]: https://wilson-eaton.com/  American Heart Association: www.heart.org Summary  The DASH eating plan is a healthy eating plan that has been shown to reduce high blood pressure (hypertension). It may also reduce your risk for type 2 diabetes, heart disease, and stroke.  With the DASH eating plan, you should limit salt (sodium) intake to 2,300 mg a day. If you have hypertension, you may need to reduce your sodium intake to 1,500 mg a day.  When on the DASH eating plan, aim to eat more fresh fruits and vegetables, whole grains, lean proteins, low-fat dairy, and heart-healthy fats.  Work  with your health care provider or diet and nutrition specialist (dietitian) to adjust your eating plan to your individual calorie needs. This information is not intended to replace advice given to you by your health care provider. Make sure you discuss any questions you have with your health care provider. Document Released: 05/12/2011 Document Revised: 05/16/2016 Document Reviewed: 05/16/2016 Elsevier Interactive Patient Education  2017 [place].   Dear Patient,  It has been recommended to you that you have a  colonoscopy. It is your responsibility to carry through with this recommendation.   Did you realize that colon cancer is the second leading cancer killer in the [place]. One in every 20 adults will get colon cancer. If all adults would go through the recommended screening for colon cancer (getting a colonoscopy), then there would be a 60% reduction in the number of people dying from colon cancer.  Colon cancer just doesn't come out of the blue. It starts off as a small polyp which over time grows into a cancer. A colonoscopy can prevent cancer and in many cases detected when it is at a very treatable phase. Small colon cancers can have cure rates of 95%. Advanced colon cancer, which often occurs in people who do not do their screenings, have cure rates less than 20%. The risk of colon cancer advances with age. Most adults should have regular colonoscopies every 10 years starting at age 21. This recommendation can vary depending on a person's medical history.  Health-care laws now allow for you to call the gastroenterologist office directly in order to set yourself up for this very important tests. Today we have recommended to you that you do this test. This test may save your life. Failure to do this test puts you at risk for premature death from colon cancer. Do the right thing and schedule this test now.  Here as a list of specialists we recommend in the surrounding area. When you call their office let them know that you are a patient of our practice in your interested in doing a screening colonoscopy. They should assist you without problems. You will need the following information when you called them: 1-name of which Dr. you see, 2-your insurance information, 3-a list of medications that you currently take, 4-any allergies you have to medications.  [place] gastroenterologist Dr. Milton Ferguson, Dr Felicie Morn gastroenterologist   [facility]   [phone]  [facility] gastroenterology (Dr. Garnetta Buddy and [place]) [phone]  [facility] gastroenterology (Dr. Leia Alf, Harrietta Guardian, [place]) [phone]  Each group of specialists has assured [place] that when you called them they will help you get your colonoscopy set up. Should you have problems or if the GI practice insist a referral be done please let us know. Be sure to call soon. Sincerely, Pearson Forster, Dr Mickie Hillier, [place]

## 2017-08-22 ENCOUNTER — Telehealth: Payer: Self-pay | Admitting: Family Medicine

## 2017-08-22 DIAGNOSIS — E785 Hyperlipidemia, unspecified: Secondary | ICD-10-CM

## 2017-08-22 DIAGNOSIS — Z1159 Encounter for screening for other viral diseases: Secondary | ICD-10-CM

## 2017-08-22 DIAGNOSIS — R739 Hyperglycemia, unspecified: Secondary | ICD-10-CM

## 2017-08-22 DIAGNOSIS — Z114 Encounter for screening for human immunodeficiency virus [HIV]: Secondary | ICD-10-CM

## 2017-08-22 DIAGNOSIS — Z79899 Other long term (current) drug therapy: Secondary | ICD-10-CM

## 2017-08-22 NOTE — Telephone Encounter (Signed)
Lab orders put in. Pt notified.  

## 2017-08-22 NOTE — Telephone Encounter (Signed)
Lipid, liver, metabolic 7, K7Q, HIV antibody, hep C antibody, CBC- hyperlipidemia hypertension hyperglycemia/prediabetes and screening per CDC recommendations

## 2017-08-22 NOTE — Telephone Encounter (Signed)
Last labs 09/01/15 lipid, a1c bmp

## 2017-08-22 NOTE — Telephone Encounter (Signed)
Pt is needing labs to be reordered. Last labs ordered have expired. Please advise.

## 2017-08-24 LAB — CBC WITH DIFFERENTIAL/PLATELET
BASOS: 0 %
Basophils Absolute: 0 10*3/uL (ref 0.0–0.2)
EOS (ABSOLUTE): 0.2 10*3/uL (ref 0.0–0.4)
EOS: 4 %
HEMATOCRIT: 44.4 % (ref 34.0–46.6)
HEMOGLOBIN: 14.9 g/dL (ref 11.1–15.9)
IMMATURE GRANS (ABS): 0 10*3/uL (ref 0.0–0.1)
Immature Granulocytes: 0 %
Lymphocytes Absolute: 2.2 10*3/uL (ref 0.7–3.1)
Lymphs: 35 %
MCH: 29.3 pg (ref 26.6–33.0)
MCHC: 33.6 g/dL (ref 31.5–35.7)
MCV: 87 fL (ref 79–97)
MONOCYTES: 9 %
MONOS ABS: 0.6 10*3/uL (ref 0.1–0.9)
NEUTROS PCT: 52 %
Neutrophils Absolute: 3.3 10*3/uL (ref 1.4–7.0)
Platelets: 221 10*3/uL (ref 150–379)
RBC: 5.08 x10E6/uL (ref 3.77–5.28)
RDW: 14.1 % (ref 12.3–15.4)
WBC: 6.4 10*3/uL (ref 3.4–10.8)

## 2017-08-24 LAB — HEPATIC FUNCTION PANEL
ALK PHOS: 98 IU/L (ref 39–117)
ALT: 19 IU/L (ref 0–32)
AST: 18 IU/L (ref 0–40)
Albumin: 4.3 g/dL (ref 3.6–4.8)
BILIRUBIN TOTAL: 0.5 mg/dL (ref 0.0–1.2)
BILIRUBIN, DIRECT: 0.13 mg/dL (ref 0.00–0.40)
Total Protein: 7 g/dL (ref 6.0–8.5)

## 2017-08-24 LAB — HEPATITIS C ANTIBODY: Hep C Virus Ab: <0.1 s/co ratio (ref 0.0–0.9)

## 2017-08-24 LAB — BASIC METABOLIC PANEL
BUN/Creatinine Ratio: 20 (ref 12–28)
BUN: 16 mg/dL (ref 8–27)
CO2: 23 mmol/L (ref 20–29)
Calcium: 9.9 mg/dL (ref 8.7–10.3)
Chloride: 105 mmol/L (ref 96–106)
Creatinine, Ser: 0.81 mg/dL (ref 0.57–1.00)
GFR calc Af Amer: 89 mL/min/{1.73_m2} (ref >59)
GFR, EST NON AFRICAN AMERICAN: 77 mL/min/{1.73_m2} (ref >59)
GLUCOSE: 99 mg/dL (ref 65–99)
POTASSIUM: 4.3 mmol/L (ref 3.5–5.2)
SODIUM: 142 mmol/L (ref 134–144)

## 2017-08-24 LAB — LIPID PANEL
CHOL/HDL RATIO: 3.5 ratio (ref 0.0–4.4)
CHOLESTEROL TOTAL: 212 mg/dL — AB (ref 100–199)
HDL: 60 mg/dL (ref >39)
LDL Calculated: 121 mg/dL — ABNORMAL HIGH (ref 0–99)
TRIGLYCERIDES: 156 mg/dL — AB (ref 0–149)
VLDL Cholesterol Cal: 31 mg/dL (ref 5–40)

## 2017-08-24 LAB — HEMOGLOBIN A1C
ESTIMATED AVERAGE GLUCOSE: 117 mg/dL
Hgb A1c MFr Bld: 5.7 % — ABNORMAL HIGH (ref 4.8–5.6)

## 2017-08-24 LAB — HIV ANTIBODY (ROUTINE TESTING W REFLEX): HIV Screen 4th Generation wRfx: NONREACTIVE

## 2017-09-01 ENCOUNTER — Ambulatory Visit: Payer: BLUE CROSS/BLUE SHIELD | Admitting: Family Medicine

## 2017-09-04 ENCOUNTER — Ambulatory Visit: Payer: BLUE CROSS/BLUE SHIELD | Admitting: Family Medicine

## 2017-09-04 ENCOUNTER — Encounter: Payer: Self-pay | Admitting: Family Medicine

## 2017-09-04 VITALS — BP 130/88 | Temp 98.0°F | Ht 66.0 in | Wt 218.0 lb

## 2017-09-04 DIAGNOSIS — I10 Essential (primary) hypertension: Secondary | ICD-10-CM

## 2017-09-04 DIAGNOSIS — E785 Hyperlipidemia, unspecified: Secondary | ICD-10-CM | POA: Diagnosis not present

## 2017-09-04 DIAGNOSIS — Z1211 Encounter for screening for malignant neoplasm of colon: Secondary | ICD-10-CM | POA: Diagnosis not present

## 2017-09-04 DIAGNOSIS — R7303 Prediabetes: Secondary | ICD-10-CM

## 2017-09-04 MED ORDER — LOSARTAN POTASSIUM 50 MG PO TABS: 50.0000 mg | ORAL_TABLET | Freq: Every morning | ORAL | 11 refills | Status: DC

## 2017-09-04 NOTE — Progress Notes (Signed)
   Subjective:    Patient ID: Patricia Fuller, female    DOB: Mar 29, 1951, 67 y.o.   MRN: [id number]  HPI Patient is here today to follow up on Htn. She states she eats healthy and does not exercise as she should. She does not see any specialist.  Patient for blood pressure check up. Patient relates compliance with meds. Todays BP reviewed with the patient. Patient denies issues with medication. Patient relates reasonable diet. Patient tries to minimize salt. Patient aware of BP goals.  Patient has moderate obesity she knows she needs to lose weight she is trying to watch her diet and is staying active she is thinking about retiring later this year  She is not interested in any type of mammograms or colonoscopy  Review of Systems  Constitutional: Negative for activity change, appetite change and fatigue.  HENT: Negative for congestion.   Respiratory: Negative for cough.   Cardiovascular: Negative for chest pain.  Gastrointestinal: Negative for abdominal pain.  Endocrine: Negative for polydipsia and polyphagia.  Skin: Negative for color change.  Neurological: Negative for weakness.  Psychiatric/Behavioral: Negative for confusion.       Objective:   Physical Exam  Constitutional: She appears well-developed and well-nourished. No distress.  HENT:  Head: Normocephalic and atraumatic.  Eyes: Right eye exhibits no discharge. Left eye exhibits no discharge.  Neck: No tracheal deviation present.  Cardiovascular: Normal rate, regular rhythm and normal heart sounds.  No murmur heard. Pulmonary/Chest: Effort normal and breath sounds normal. No respiratory distress. She has no wheezes. She has no rales.  Musculoskeletal: She exhibits no edema.  Lymphadenopathy:    She has no cervical adenopathy.  Neurological: She is alert. She exhibits normal muscle tone.  Skin: Skin is warm and dry. No erythema.  Psychiatric: Her behavior is normal.  Vitals reviewed.   Patient with prediabetes she is and  encouraged to try to watch diet lose weight      Assessment & Plan:  HTN- Patient was seen today as part of a visit regarding hypertension. The importance of healthy diet and regular physical activity was discussed. The importance of compliance with medications discussed. Ideal goal is to keep blood pressure low elevated levels certainly below 481/85 when possible. The patient was counseled that keeping blood pressure under control lessen his risk of heart attack, stroke, kidney failure, and early death. The importance of regular follow-ups was discussed with the patient. Low-salt diet such as DASH recommended. Regular physical activity was recommended as well. Patient was advised to keep regular follow-ups.  Obesity patient encouraged to lose weight watch diet  Screening colonoscopy mammogram encourage patient defers  Stool blood tests for blood ordered

## 2017-09-04 NOTE — Patient Instructions (Signed)
Results for orders placed or performed in visit on 08/22/17  Lipid panel  Result Value Ref Range   Cholesterol, Total 212 (H) 100 - 199 mg/dL   Triglycerides 156 (H) 0 - 149 mg/dL   HDL 60 >39 mg/dL   VLDL Cholesterol Cal 31 5 - 40 mg/dL   LDL Calculated 121 (H) 0 - 99 mg/dL   Chol/HDL Ratio 3.5 0.0 - 4.4 ratio  Hepatic function panel  Result Value Ref Range   Total Protein 7.0 6.0 - 8.5 g/dL   Albumin 4.3 3.6 - 4.8 g/dL   Bilirubin Total 0.5 0.0 - 1.2 mg/dL   Bilirubin, Direct 0.13 0.00 - 0.40 mg/dL   Alkaline Phosphatase 98 39 - 117 IU/L   AST 18 0 - 40 IU/L   ALT 19 0 - 32 IU/L  Basic metabolic panel  Result Value Ref Range   Glucose 99 65 - 99 mg/dL   BUN 16 8 - 27 mg/dL   Creatinine, Ser 0.81 0.57 - 1.00 mg/dL   GFR calc non Af Amer 77 >59 mL/min/1.73   GFR calc Af Amer 89 >59 mL/min/1.73   BUN/Creatinine Ratio 20 12 - 28   Sodium 142 134 - 144 mmol/L   Potassium 4.3 3.5 - 5.2 mmol/L   Chloride 105 96 - 106 mmol/L   CO2 23 20 - 29 mmol/L   Calcium 9.9 8.7 - 10.3 mg/dL  Hemoglobin A1c  Result Value Ref Range   Hgb A1c MFr Bld 5.7 (H) 4.8 - 5.6 %   Est. average glucose Bld gHb Est-mCnc 117 mg/dL  HIV antibody  Result Value Ref Range   HIV Screen 4th Generation wRfx Non Reactive Non Reactive  Hepatitis C antibody  Result Value Ref Range   Hep C Virus Ab <0.1 0.0 - 0.9 s/co ratio  CBC with Differential/Platelet  Result Value Ref Range   WBC 6.4 3.4 - 10.8 x10E3/uL   RBC 5.08 3.77 - 5.28 x10E6/uL   Hemoglobin 14.9 11.1 - 15.9 g/dL   Hematocrit 44.4 34.0 - 46.6 %   MCV 87 79 - 97 fL   MCH 29.3 26.6 - 33.0 pg   MCHC 33.6 31.5 - 35.7 g/dL   RDW 14.1 12.3 - 15.4 %   Platelets 221 150 - 379 x10E3/uL   Neutrophils 52 Not Estab. %   Lymphs 35 Not Estab. %   Monocytes 9 Not Estab. %   Eos 4 Not Estab. %   Basos 0 Not Estab. %   Neutrophils Absolute 3.3 1.4 - 7.0 x10E3/uL   Lymphocytes Absolute 2.2 0.7 - 3.1 x10E3/uL   Monocytes Absolute 0.6 0.1 - 0.9 x10E3/uL   EOS  (ABSOLUTE) 0.2 0.0 - 0.4 x10E3/uL   Basophils Absolute 0.0 0.0 - 0.2 x10E3/uL   Immature Granulocytes 0 Not Estab. %   Immature Grans (Abs) 0.0 0.0 - 0.1 x10E3/uL

## 2018-09-10 ENCOUNTER — Other Ambulatory Visit: Payer: Self-pay

## 2018-09-10 ENCOUNTER — Emergency Department (HOSPITAL_COMMUNITY): Payer: PPO

## 2018-09-10 ENCOUNTER — Emergency Department (HOSPITAL_COMMUNITY)
Admission: EM | Admit: 2018-09-10 | Discharge: 2018-09-10 | Disposition: A | Discharge status: Home / Self Care | Payer: PPO | Attending: Emergency Medicine | Admitting: Emergency Medicine

## 2018-09-10 ENCOUNTER — Encounter (HOSPITAL_COMMUNITY): Payer: Self-pay | Admitting: Emergency Medicine

## 2018-09-10 DIAGNOSIS — R7303 Prediabetes: Secondary | ICD-10-CM | POA: Insufficient documentation

## 2018-09-10 DIAGNOSIS — R0789 Other chest pain: Secondary | ICD-10-CM

## 2018-09-10 DIAGNOSIS — Y9389 Activity, other specified: Secondary | ICD-10-CM | POA: Diagnosis not present

## 2018-09-10 DIAGNOSIS — Y998 Other external cause status: Secondary | ICD-10-CM | POA: Insufficient documentation

## 2018-09-10 DIAGNOSIS — M7062 Trochanteric bursitis, left hip: Secondary | ICD-10-CM | POA: Diagnosis not present

## 2018-09-10 DIAGNOSIS — R072 Precordial pain: Secondary | ICD-10-CM | POA: Diagnosis not present

## 2018-09-10 DIAGNOSIS — S0990XA Unspecified injury of head, initial encounter: Secondary | ICD-10-CM

## 2018-09-10 DIAGNOSIS — Z7982 Long term (current) use of aspirin: Secondary | ICD-10-CM | POA: Insufficient documentation

## 2018-09-10 DIAGNOSIS — Y92414 Local residential or business street as the place of occurrence of the external cause: Secondary | ICD-10-CM | POA: Insufficient documentation

## 2018-09-10 DIAGNOSIS — S0003XA Contusion of scalp, initial encounter: Secondary | ICD-10-CM | POA: Diagnosis not present

## 2018-09-10 DIAGNOSIS — I10 Essential (primary) hypertension: Secondary | ICD-10-CM | POA: Diagnosis not present

## 2018-09-10 DIAGNOSIS — Z79899 Other long term (current) drug therapy: Secondary | ICD-10-CM | POA: Insufficient documentation
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
## 2018-09-10 NOTE — Discharge Instructions (Addendum)
X-rays show no fracture.  You will be sore for several days.  Ice pack to painful areas.  Tylenol or ibuprofen for pain.

## 2018-09-10 NOTE — ED Notes (Signed)
Patient given discharge instruction, verbalized understand. Patient ambulatory out of the department.  

## 2018-09-10 NOTE — ED Notes (Signed)
Ice pack applied, large knot to forehead
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
## 2018-09-18 ENCOUNTER — Ambulatory Visit (INDEPENDENT_AMBULATORY_CARE_PROVIDER_SITE_OTHER): Payer: PPO | Admitting: Family Medicine

## 2018-09-18 ENCOUNTER — Other Ambulatory Visit: Payer: Self-pay

## 2018-09-18 ENCOUNTER — Encounter: Payer: Self-pay | Admitting: Family Medicine

## 2018-09-18 DIAGNOSIS — S0003XD Contusion of scalp, subsequent encounter: Secondary | ICD-10-CM
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
## 2018-11-09 ENCOUNTER — Ambulatory Visit (INDEPENDENT_AMBULATORY_CARE_PROVIDER_SITE_OTHER): Payer: PPO | Admitting: Family Medicine

## 2018-11-09 ENCOUNTER — Other Ambulatory Visit: Payer: Self-pay

## 2018-11-09 DIAGNOSIS — S0003XD Contusion of scalp, subsequent encounter: Secondary | ICD-10-CM

## 2018-11-09 DIAGNOSIS — R51 Headache: Secondary | ICD-10-CM | POA: Diagnosis not present
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
## 2018-11-09 NOTE — Progress Notes (Signed)
   Subjective:    Patient ID: Patricia Fuller, female    DOB: 10-Feb-1951, 68 y.o.   MRN: [id number]  HPI    Review of Systems     Objective:   Physical Exam        Assessment & Plan:

## 2018-11-28 ENCOUNTER — Ambulatory Visit (INDEPENDENT_AMBULATORY_CARE_PROVIDER_SITE_OTHER): Payer: PPO | Admitting: Nurse Practitioner

## 2018-11-28 ENCOUNTER — Other Ambulatory Visit: Payer: Self-pay

## 2018-11-28 DIAGNOSIS — I10 Essential (primary) hypertension: Secondary | ICD-10-CM | POA: Diagnosis not present

## 2018-11-28 DIAGNOSIS — E785 Hyperlipidemia, unspecified: Secondary | ICD-10-CM

## 2018-11-28 DIAGNOSIS — R7303 Prediabetes: Secondary | ICD-10-CM | POA: Diagnosis not present

## 2018-11-28 MED ORDER — LOSARTAN POTASSIUM 50 MG PO TABS: 50.0000 mg | ORAL_TABLET | Freq: Every morning | ORAL | 1 refills | Status: DC

## 2018-11-28 NOTE — Progress Notes (Signed)
   Subjective:  PHONE VISIT  Patient ID: Patricia Fuller, female    DOB: [birth year], 68 y.o.   MRN: [id number]  Hypertension This is a chronic problem. The current episode started more than 1 year ago. Risk factors for coronary artery disease include post-menopausal state (losartan). Treatments tried: losartan 50 mg. There are no compliance problems.     Virtual Visit via Video Note  I connected with Patricia Fuller on 11/28/18 at  2:00 PM EDT by a video enabled telemedicine application and verified that I am speaking with the correct person using two identifiers.  Location: Patient: home Provider: office   I discussed the limitations of evaluation and management by telemedicine and the availability of in person appointments. The patient expressed understanding and agreed to proceed.  History of Present Illness: Presents by phone for recheck on her hypertension.  Occasionally checks her BP outside the office, runs 120s/80s.  Non-smoker.  Less active than usual due to COVID.  No chest pain/ischemic type pain or shortness of breath.  No edema.  No visual changes.  Her last eye exam was normal.  No difficulty speaking or swallowing.  No numbness or weakness of the face arms or legs.  Is due for her regular lab work.   Observations/Objective: Today's visit was via telephone Physical exam was not possible for this visit NAD.  Alert, oriented.  Thoughts logical coherent and relevant.  No weight available.  Assessment and Plan: Problem List Items Addressed This Visit      Cardiovascular and Mediastinum   Essential hypertension, benign - Primary   Relevant Medications   losartan (COZAAR) 50 MG tablet   Other Relevant Orders   Comprehensive metabolic panel   Lipid panel     Other   Hyperlipidemia   Relevant Medications   losartan (COZAAR) 50 MG tablet   Other Relevant Orders   Lipid panel   Prediabetes   Relevant Orders   Comprehensive metabolic panel   Hemoglobin A1c     Meds ordered  this encounter  Medications  . losartan (COZAAR) 50 MG tablet    Sig: Take 1 tablet (50 mg total) by mouth every morning.    Dispense:  90 tablet    Refill:  1    updating refills    Order Specific Question:   Supervising Provider    Answer:   Sallee Lange A [[id number]]     Follow Up Instructions: Recommend restarting her activity.  Obtain routine labs.  Strongly recommend wellness exam this fall including preventive health maintenance. Return in about 6 months (around 05/30/2019) for Consider physical this fall.    I discussed the assessment and treatment plan with the patient. The patient was provided an opportunity to ask questions and all were answered. The patient agreed with the plan and demonstrated an understanding of the instructions.   The patient was advised to call back or seek an in-person evaluation if the symptoms worsen or if the condition fails to improve as anticipated.  I provided 15 minutes of non-face-to-face time during this encounter.     Review of Systems     Objective:   Physical Exam        Assessment & Plan:

## 2018-11-29 ENCOUNTER — Encounter: Payer: Self-pay | Admitting: Nurse Practitioner

## 2018-11-29 DIAGNOSIS — I10 Essential (primary) hypertension: Secondary | ICD-10-CM | POA: Diagnosis not present

## 2018-11-29 DIAGNOSIS — E785 Hyperlipidemia, unspecified: Secondary | ICD-10-CM | POA: Diagnosis not present

## 2018-11-29 DIAGNOSIS — R7303 Prediabetes: Secondary | ICD-10-CM | POA: Diagnosis not present

## 2018-11-30 LAB — COMPREHENSIVE METABOLIC PANEL
ALT: 22 IU/L (ref 0–32)
AST: 17 IU/L (ref 0–40)
Albumin/Globulin Ratio: 1.7 (ref 1.2–2.2)
Albumin: 4.4 g/dL (ref 3.8–4.8)
Alkaline Phosphatase: 102 IU/L (ref 39–117)
BUN/Creatinine Ratio: 14 (ref 12–28)
BUN: 13 mg/dL (ref 8–27)
Bilirubin Total: 0.6 mg/dL (ref 0.0–1.2)
CO2: 23 mmol/L (ref 20–29)
Calcium: 10.1 mg/dL (ref 8.7–10.3)
Chloride: 103 mmol/L (ref 96–106)
Creatinine, Ser: 0.9 mg/dL (ref 0.57–1.00)
GFR calc Af Amer: 78 mL/min/{1.73_m2} (ref >59)
GFR calc non Af Amer: 67 mL/min/{1.73_m2} (ref >59)
Globulin, Total: 2.6 g/dL (ref 1.5–4.5)
Glucose: 109 mg/dL — ABNORMAL HIGH (ref 65–99)
Potassium: 4.4 mmol/L (ref 3.5–5.2)
Sodium: 143 mmol/L (ref 134–144)
Total Protein: 7 g/dL (ref 6.0–8.5)

## 2018-11-30 LAB — LIPID PANEL
Chol/HDL Ratio: 4.9 ratio — ABNORMAL HIGH (ref 0.0–4.4)
Cholesterol, Total: 235 mg/dL — ABNORMAL HIGH (ref 100–199)
HDL: 48 mg/dL (ref >39)
LDL Calculated: 130 mg/dL — ABNORMAL HIGH (ref 0–99)
Triglycerides: 284 mg/dL — ABNORMAL HIGH (ref 0–149)
VLDL Cholesterol Cal: 57 mg/dL — ABNORMAL HIGH (ref 5–40)

## 2018-11-30 LAB — HEMOGLOBIN A1C
Est. average glucose Bld gHb Est-mCnc: 123 mg/dL
Hgb A1c MFr Bld: 5.9 % — ABNORMAL HIGH (ref 4.8–5.6)

## 2018-12-03 ENCOUNTER — Other Ambulatory Visit: Payer: Self-pay

## 2018-12-03 ENCOUNTER — Ambulatory Visit (INDEPENDENT_AMBULATORY_CARE_PROVIDER_SITE_OTHER): Payer: PPO | Admitting: Family Medicine

## 2018-12-03 ENCOUNTER — Other Ambulatory Visit: Payer: Self-pay | Admitting: Nurse Practitioner

## 2018-12-03 DIAGNOSIS — Z79899 Other long term (current) drug therapy: Secondary | ICD-10-CM

## 2018-12-03 DIAGNOSIS — E785 Hyperlipidemia, unspecified: Secondary | ICD-10-CM | POA: Diagnosis not present

## 2018-12-03 DIAGNOSIS — J019 Acute sinusitis, unspecified: Secondary | ICD-10-CM | POA: Diagnosis not present

## 2018-12-03 MED ORDER — AMOXICILLIN 500 MG PO TABS: 500.0000 mg | ORAL_TABLET | Freq: Three times a day (TID) | ORAL | 0 refills | Status: DC

## 2018-12-03 MED ORDER — ROSUVASTATIN CALCIUM 5 MG PO TABS: 5.0000 mg | ORAL_TABLET | Freq: Every day | ORAL | 1 refills | Status: DC

## 2018-12-03 NOTE — Addendum Note (Signed)
Addended by: Dairl Ponder on: 12/03/2018 01:09 PM   Modules accepted: Orders

## 2018-12-03 NOTE — Progress Notes (Signed)
   Subjective:    Patient ID: Patricia Fuller, female    DOB: [birth year], 68 y.o.   MRN: [id number] Telephone only video not possible Sinus Problem This is a new problem. The current episode started in the past 7 days. Associated symptoms include congestion. Pertinent negatives include no coughing, ear pain or shortness of breath.  Patient relates sinus congestion pressure been going on over the past few weeks relates mainly on the right side but some on the left side some mucoid drainage at times but denies any high fever chills sweats denies any wheezing difficulty breathing  Recently had visit with Patricia Fuller and cholesterol profile came back elevated she is willing to go on medication and is willing to have [place] go ahead and send this in   Right side of face swells until she gets the congestion out. Patient taking [employer] sinus med but not helping much  Virtual Visit via Video Note  I connected with Patricia Fuller on 12/03/18 at 11:00 AM EDT by a video enabled telemedicine application and verified that I am speaking with the correct person using two identifiers.  Location: Patient: home Provider: office   I discussed the limitations of evaluation and management by telemedicine and the availability of in person appointments. The patient expressed understanding and agreed to proceed.  History of Present Illness:    Observations/Objective:   Assessment and Plan:   Follow Up Instructions:    I discussed the assessment and treatment plan with the patient. The patient was provided an opportunity to ask questions and all were answered. The patient agreed with the plan and demonstrated an understanding of the instructions.   The patient was advised to call back or seek an in-person evaluation if the symptoms worsen or if the condition fails to improve as anticipated.  I provided 15 minutes of non-face-to-face time during this encounter.        Review of Systems  Constitutional: Negative  for activity change and fever.  HENT: Positive for congestion and rhinorrhea. Negative for ear pain.   Eyes: Negative for discharge.  Respiratory: Negative for cough, shortness of breath and wheezing.   Cardiovascular: Negative for chest pain.       Objective:   Physical Exam   Today's visit was via telephone Physical exam was not possible for this visit      Assessment & Plan:  Allergic rhinitis along with acute rhinosinusitis OTC Claritin daily may use sinus tablets as needed Also Flonase 2 sprays each nare daily for the next few weeks  Go ahead with amoxicillin 3 times daily for 10 days to cover for a sinus infection follow-up if progressive troubles  Hyperlipidemia her calculated risk for heart disease is at least 9 to 10% based upon this based upon discussion patient willing to start medicine.  Patient was told that if she starts to have muscle aches pains discomfort to stop medicine notify us We will go with Crestor 5 mg she will take Monday Wednesday Friday for the first 2 weeks then 1 daily thereafter. Lipid liver profile in approximately 3 to 4 months with follow-up virtual visit

## 2018-12-03 NOTE — Progress Notes (Signed)
Nurses, I apologize. Scott did start medication on this patient today. Saw this after I answered the phone message you sent.

## 2019-03-04 ENCOUNTER — Other Ambulatory Visit: Payer: Self-pay | Admitting: Family Medicine

## 2019-03-05 ENCOUNTER — Ambulatory Visit (INDEPENDENT_AMBULATORY_CARE_PROVIDER_SITE_OTHER): Payer: PPO | Admitting: Family Medicine

## 2019-03-05 ENCOUNTER — Encounter: Payer: Self-pay | Admitting: Family Medicine

## 2019-03-05 ENCOUNTER — Other Ambulatory Visit: Payer: Self-pay

## 2019-03-05 DIAGNOSIS — J019 Acute sinusitis, unspecified: Secondary | ICD-10-CM

## 2019-03-05 MED ORDER — AMOXICILLIN 500 MG PO CAPS: 500.0000 mg | ORAL_CAPSULE | Freq: Three times a day (TID) | ORAL | 0 refills | Status: DC

## 2019-03-05 NOTE — Progress Notes (Signed)
   Subjective:  Audio only  Patient ID: Patricia Fuller, female    DOB: 10-27-1950, 68 y.o.   MRN: [id number]  Sinus Problem This is a new problem. The current episode started in the past 7 days. Associated symptoms include congestion, headaches and sinus pressure.   Right side cong and pressure  Started last few days ago  Mowed the yard n allergies flared up     Review of Systems  HENT: Positive for congestion and sinus pressure.   Neurological: Positive for headaches.   Virtual Visit via Video Note  I connected with Lavella Hammock on 03/05/19 at 10:00 AM EDT by a video enabled telemedicine application and verified that I am speaking with the correct person using two identifiers.  Location: Patient: home Provider: office   I discussed the limitations of evaluation and management by telemedicine and the availability of in person appointments. The patient expressed understanding and agreed to proceed.  History of Present Illness:    Observations/Objective:   Assessment and Plan:   Follow Up Instructions:    I discussed the assessment and treatment plan with the patient. The patient was provided an opportunity to ask questions and all were answered. The patient agreed with the plan and demonstrated an understanding of the instructions.   The patient was advised to call back or seek an in-person evaluation if the symptoms worsen or if the condition fails to improve as anticipated.  I provided 18 minutes of non-face-to-face time during this encounter.        Objective:   Physical Exam  Virtual      Assessment & Plan:  Probable sinusitis.  Patient notes no chest symptoms no cough no sore throat no fever no shortness of breath.  States she has been around no one else sick.  States this came on exactly the way to her usual fall sinus infections,.  Warning signs discussed.  Antibiotics prescribed symptom care discussed

## 2019-04-29 ENCOUNTER — Telehealth: Payer: Self-pay | Admitting: Family Medicine

## 2019-04-29 DIAGNOSIS — E785 Hyperlipidemia, unspecified: Secondary | ICD-10-CM

## 2019-04-29 DIAGNOSIS — Z79899 Other long term (current) drug therapy: Secondary | ICD-10-CM

## 2019-04-29 DIAGNOSIS — I10 Essential (primary) hypertension: Secondary | ICD-10-CM

## 2019-04-29 NOTE — Telephone Encounter (Signed)
Patient wanting to know if she needs labs done for her six month follow up in December. Please advise

## 2019-04-29 NOTE — Telephone Encounter (Signed)
Lab orders placed and pt is aware 

## 2019-04-29 NOTE — Telephone Encounter (Signed)
Last labs completed 11/29/2018 A1C, Lipid and CMP. Please advise. Thank you

## 2019-04-29 NOTE — Telephone Encounter (Signed)
Lipid, liver, metabolic 7 

## 2019-05-13 DIAGNOSIS — I10 Essential (primary) hypertension: Secondary | ICD-10-CM | POA: Diagnosis not present

## 2019-05-13 DIAGNOSIS — E785 Hyperlipidemia, unspecified: Secondary | ICD-10-CM | POA: Diagnosis not present

## 2019-05-13 DIAGNOSIS — Z79899 Other long term (current) drug therapy: Secondary | ICD-10-CM | POA: Diagnosis not present

## 2019-05-14 LAB — BASIC METABOLIC PANEL
BUN/Creatinine Ratio: 24 (ref 12–28)
BUN: 20 mg/dL (ref 8–27)
CO2: 22 mmol/L (ref 20–29)
Calcium: 10.2 mg/dL (ref 8.7–10.3)
Chloride: 103 mmol/L (ref 96–106)
Creatinine, Ser: 0.84 mg/dL (ref 0.57–1.00)
GFR calc Af Amer: 84 mL/min/{1.73_m2} (ref >59)
GFR calc non Af Amer: 73 mL/min/{1.73_m2} (ref >59)
Glucose: 109 mg/dL — ABNORMAL HIGH (ref 65–99)
Potassium: 4.5 mmol/L (ref 3.5–5.2)
Sodium: 140 mmol/L (ref 134–144)

## 2019-05-14 LAB — HEPATIC FUNCTION PANEL
ALT: 8 IU/L (ref 0–32)
AST: 22 IU/L (ref 0–40)
Albumin: 4.3 g/dL (ref 3.8–4.8)
Alkaline Phosphatase: 113 IU/L (ref 39–117)
Bilirubin Total: 0.5 mg/dL (ref 0.0–1.2)
Bilirubin, Direct: 0.16 mg/dL (ref 0.00–0.40)
Total Protein: 7.1 g/dL (ref 6.0–8.5)

## 2019-05-14 LAB — LIPID PANEL
Chol/HDL Ratio: 2.7 ratio (ref 0.0–4.4)
Cholesterol, Total: 153 mg/dL (ref 100–199)
HDL: 56 mg/dL (ref >39)
LDL Chol Calc (NIH): 74 mg/dL (ref 0–99)
Triglycerides: 135 mg/dL (ref 0–149)
VLDL Cholesterol Cal: 23 mg/dL (ref 5–40)

## 2019-05-22 ENCOUNTER — Ambulatory Visit: Payer: PPO | Admitting: Family Medicine

## 2019-05-27 ENCOUNTER — Ambulatory Visit (INDEPENDENT_AMBULATORY_CARE_PROVIDER_SITE_OTHER): Payer: PPO | Admitting: Family Medicine

## 2019-05-27 ENCOUNTER — Other Ambulatory Visit: Payer: Self-pay

## 2019-05-27 DIAGNOSIS — I10 Essential (primary) hypertension: Secondary | ICD-10-CM

## 2019-05-27 DIAGNOSIS — E7849 Other hyperlipidemia: Secondary | ICD-10-CM

## 2019-05-27 MED ORDER — LOSARTAN POTASSIUM 50 MG PO TABS: 50.0000 mg | ORAL_TABLET | Freq: Every morning | ORAL | 1 refills | Status: DC

## 2019-05-27 MED ORDER — ROSUVASTATIN CALCIUM 5 MG PO TABS: 5.0000 mg | ORAL_TABLET | Freq: Every day | ORAL | 1 refills | Status: DC

## 2019-05-27 NOTE — Progress Notes (Signed)
Subjective:    Patient ID: Patricia Fuller, female    DOB: [birth year], 68 y.o.   MRN: [id number]  HPI Pt here for 6 month follow up. Pt states she is taking medications as prescribed. No issues or concerns. Patient does good job taking her medicine regular basis watch salt in the diet stays physically active denies any breathing problems denies any chest tightness pressure pain shortness of breath.  Energy level overall doing good.  Tries to avoid Covid as best as possible.  Tries to minimize fats in the diet takes her medicine regular basis.  PMH benign Virtual Visit via Telephone Note  I connected with Patricia Fuller on 05/27/19 at  3:00 PM EST by telephone and verified that I am speaking with the correct person using two identifiers.  Location: Patient: home Provider: office   I discussed the limitations, risks, security and privacy concerns of performing an evaluation and management service by telephone and the availability of in person appointments. I also discussed with the patient that there may be a patient responsible charge related to this service. The patient expressed understanding and agreed to proceed.   History of Present Illness:    Observations/Objective:   Assessment and Plan:   Follow Up Instructions:    I discussed the assessment and treatment plan with the patient. The patient was provided an opportunity to ask questions and all were answered. The patient agreed with the plan and demonstrated an understanding of the instructions.   The patient was advised to call back or seek an in-person evaluation if the symptoms worsen or if the condition fails to improve as anticipated.  I provided 16 minutes of non-face-to-face time during this encounter.   [name], LPN    Review of Systems  Constitutional: Negative for activity change, appetite change and fatigue.  HENT: Negative for congestion and rhinorrhea.   Respiratory: Negative for cough and shortness of  breath.   Cardiovascular: Negative for chest pain and leg swelling.  Gastrointestinal: Negative for abdominal pain and diarrhea.  Endocrine: Negative for polydipsia and polyphagia.  Skin: Negative for color change.  Neurological: Negative for dizziness and weakness.  Psychiatric/Behavioral: Negative for behavioral problems and confusion.       Objective:   Physical Exam  Today's visit was via telephone Physical exam was not possible for this visit  Results for orders placed or performed in visit on 04/29/19  Lipid Profile  Result Value Ref Range   Cholesterol, Total 153 100 - 199 mg/dL   Triglycerides 135 0 - 149 mg/dL   HDL 56 >39 mg/dL   VLDL Cholesterol Cal 23 5 - 40 mg/dL   LDL Chol Calc (NIH) 74 0 - 99 mg/dL   Chol/HDL Ratio 2.7 0.0 - 4.4 ratio  Hepatic function panel  Result Value Ref Range   Total Protein 7.1 6.0 - 8.5 g/dL   Albumin 4.3 3.8 - 4.8 g/dL   Bilirubin Total 0.5 0.0 - 1.2 mg/dL   Bilirubin, Direct 0.16 0.00 - 0.40 mg/dL   Alkaline Phosphatase 113 39 - 117 IU/L   AST 22 0 - 40 IU/L   ALT 8 0 - 32 IU/L  Basic Metabolic Panel (BMET)  Result Value Ref Range   Glucose 109 (H) 65 - 99 mg/dL   BUN 20 8 - 27 mg/dL   Creatinine, Ser 0.84 0.57 - 1.00 mg/dL   GFR calc non Af Amer 73 >59 mL/min/1.73   GFR calc Af Amer 84 >59 mL/min/1.73  BUN/Creatinine Ratio 24 12 - 28   Sodium 140 134 - 144 mmol/L   Potassium 4.5 3.5 - 5.2 mmol/L   Chloride 103 96 - 106 mmol/L   CO2 22 20 - 29 mmol/L   Calcium 10.2 8.7 - 10.3 mg/dL        Assessment & Plan:  1. Essential hypertension, benign Blood pressure reportedly under good control by patient takes her medicine watches salt in the diet  2. Other hyperlipidemia Denies hyperlipidemia takes her medicine denies any setbacks or problems.  No need to do additional lab work lab work from this visit was reviewed with patient in detail follow-up 6 months   Wellness associated issues will be addressed on her follow-up  visit in 6 months she defers on these currently

## 2019-11-19 ENCOUNTER — Telehealth: Payer: Self-pay | Admitting: Family Medicine

## 2019-11-19 DIAGNOSIS — I10 Essential (primary) hypertension: Secondary | ICD-10-CM

## 2019-11-19 DIAGNOSIS — E7849 Other hyperlipidemia: Secondary | ICD-10-CM

## 2019-11-19 DIAGNOSIS — Z79899 Other long term (current) drug therapy: Secondary | ICD-10-CM

## 2019-11-19 DIAGNOSIS — R7303 Prediabetes: Secondary | ICD-10-CM

## 2019-11-19 NOTE — Telephone Encounter (Signed)
Lab orders placed and pt is aware 

## 2019-11-19 NOTE — Telephone Encounter (Signed)
Patient is wanting to know if she needs labs for 6 month follow up 7/13 . Please advise

## 2019-11-19 NOTE — Telephone Encounter (Signed)
Last labs 05/13/19 lipid, liver, bmp

## 2019-11-19 NOTE — Telephone Encounter (Signed)
Lipid, liver, metabolic 7, urine ACR-hypertension hyperlipidemia

## 2019-11-20 ENCOUNTER — Telehealth: Payer: Self-pay | Admitting: Family Medicine

## 2019-11-20 MED ORDER — LOSARTAN POTASSIUM 50 MG PO TABS: 50.0000 mg | ORAL_TABLET | Freq: Every morning | ORAL | 0 refills | Status: DC

## 2019-11-20 MED ORDER — ROSUVASTATIN CALCIUM 5 MG PO TABS: 5.0000 mg | ORAL_TABLET | Freq: Every day | ORAL | 0 refills | Status: DC

## 2019-11-20 NOTE — Telephone Encounter (Signed)
She may have an additional 30-day refill on both and follow-up as planned

## 2019-11-20 NOTE — Telephone Encounter (Signed)
Refill sent to pharmacy.   

## 2019-11-20 NOTE — Telephone Encounter (Signed)
Pt needs about 10 pills till her appt on 12/17/2019 for blood pressure and cholesterol she was not for sure the names of the meds. She still uses [facility] in [place]

## 2019-11-20 NOTE — Telephone Encounter (Signed)
Last med check up 05/27/19. Losartan and crestor are on her med list

## 2019-12-03 DIAGNOSIS — I10 Essential (primary) hypertension: Secondary | ICD-10-CM | POA: Diagnosis not present

## 2019-12-03 DIAGNOSIS — R7303 Prediabetes: Secondary | ICD-10-CM | POA: Diagnosis not present

## 2019-12-03 DIAGNOSIS — Z79899 Other long term (current) drug therapy: Secondary | ICD-10-CM | POA: Diagnosis not present

## 2019-12-03 DIAGNOSIS — E7849 Other hyperlipidemia: Secondary | ICD-10-CM | POA: Diagnosis not present

## 2019-12-04 LAB — HEPATIC FUNCTION PANEL
ALT: 18 IU/L (ref 0–32)
AST: 21 IU/L (ref 0–40)
Albumin: 4.3 g/dL (ref 3.8–4.8)
Alkaline Phosphatase: 113 IU/L (ref 48–121)
Bilirubin Total: 0.6 mg/dL (ref 0.0–1.2)
Bilirubin, Direct: 0.16 mg/dL (ref 0.00–0.40)
Total Protein: 7.1 g/dL (ref 6.0–8.5)

## 2019-12-04 LAB — BASIC METABOLIC PANEL
BUN/Creatinine Ratio: 17 (ref 12–28)
BUN: 16 mg/dL (ref 8–27)
CO2: 24 mmol/L (ref 20–29)
Calcium: 9.8 mg/dL (ref 8.7–10.3)
Chloride: 104 mmol/L (ref 96–106)
Creatinine, Ser: 0.93 mg/dL (ref 0.57–1.00)
GFR calc Af Amer: 74 mL/min/{1.73_m2} (ref >59)
GFR calc non Af Amer: 64 mL/min/{1.73_m2} (ref >59)
Glucose: 114 mg/dL — ABNORMAL HIGH (ref 65–99)
Potassium: 4.4 mmol/L (ref 3.5–5.2)
Sodium: 140 mmol/L (ref 134–144)

## 2019-12-04 LAB — LIPID PANEL
Chol/HDL Ratio: 3.1 ratio (ref 0.0–4.4)
Cholesterol, Total: 163 mg/dL (ref 100–199)
HDL: 53 mg/dL (ref >39)
LDL Chol Calc (NIH): 83 mg/dL (ref 0–99)
Triglycerides: 154 mg/dL — ABNORMAL HIGH (ref 0–149)
VLDL Cholesterol Cal: 27 mg/dL (ref 5–40)

## 2019-12-04 LAB — MICROALBUMIN / CREATININE URINE RATIO
Creatinine, Urine: 165.5 mg/dL
Microalb/Creat Ratio: 4 mg/g creat (ref 0–29)
Microalbumin, Urine: 7.4 ug/mL

## 2019-12-17 ENCOUNTER — Encounter: Payer: Self-pay | Admitting: Family Medicine

## 2019-12-17 ENCOUNTER — Ambulatory Visit (INDEPENDENT_AMBULATORY_CARE_PROVIDER_SITE_OTHER): Payer: PPO | Admitting: Family Medicine

## 2019-12-17 ENCOUNTER — Other Ambulatory Visit: Payer: Self-pay

## 2019-12-17 VITALS — BP 124/80 | Temp 94.9°F | Wt 230.4 lb

## 2019-12-17 DIAGNOSIS — E7849 Other hyperlipidemia: Secondary | ICD-10-CM | POA: Diagnosis not present

## 2019-12-17 DIAGNOSIS — I10 Essential (primary) hypertension: Secondary | ICD-10-CM | POA: Diagnosis not present

## 2019-12-17 DIAGNOSIS — R739 Hyperglycemia, unspecified: Secondary | ICD-10-CM

## 2019-12-17 DIAGNOSIS — Z1211 Encounter for screening for malignant neoplasm of colon: Secondary | ICD-10-CM

## 2019-12-17 LAB — POCT GLYCOSYLATED HEMOGLOBIN (HGB A1C): Hemoglobin A1C: 5.8 % — AB (ref 4.0–5.6)

## 2019-12-17 MED ORDER — LOSARTAN POTASSIUM 50 MG PO TABS: 50.0000 mg | ORAL_TABLET | Freq: Every morning | ORAL | 1 refills | Status: DC

## 2019-12-17 MED ORDER — ROSUVASTATIN CALCIUM 5 MG PO TABS: 5.0000 mg | ORAL_TABLET | Freq: Every day | ORAL | 1 refills | Status: DC

## 2019-12-17 NOTE — Progress Notes (Signed)
   Subjective:    Patient ID: Patricia Fuller, female    DOB: [birth year], 69 y.o.   MRN: [id number]  Hypertension This is a chronic problem. Pertinent negatives include no chest pain or shortness of breath. There are no known risk factors for coronary artery disease. There are no compliance problems (Takes Losartan 5 mg daily and checks BP at home).    I did talk with the patient about doing Covid vaccine she will think about it   Review of Systems  Constitutional: Negative for activity change, appetite change and fatigue.  HENT: Negative for congestion and rhinorrhea.   Respiratory: Negative for cough and shortness of breath.   Cardiovascular: Negative for chest pain and leg swelling.  Gastrointestinal: Negative for abdominal pain and diarrhea.  Endocrine: Negative for polydipsia and polyphagia.  Skin: Negative for color change.  Neurological: Negative for dizziness and weakness.  Psychiatric/Behavioral: Negative for behavioral problems and confusion.       Objective:   Physical Exam Vitals reviewed.  Constitutional:      General: She is not in acute distress. HENT:     Head: Normocephalic and atraumatic.  Eyes:     General:        Right eye: No discharge.        Left eye: No discharge.  Neck:     Trachea: No tracheal deviation.  Cardiovascular:     Rate and Rhythm: Normal rate and regular rhythm.     Heart sounds: Normal heart sounds. No murmur heard.   Pulmonary:     Effort: Pulmonary effort is normal. No respiratory distress.     Breath sounds: Normal breath sounds.  Lymphadenopathy:     Cervical: No cervical adenopathy.  Skin:    General: Skin is warm and dry.  Neurological:     Mental Status: She is alert.     Coordination: Coordination normal.  Psychiatric:        Behavior: Behavior normal.     Covid vaccine recommended Mammogram recommended     Assessment & Plan:  1. Essential hypertension, benign Blood pressure good control continue current measures work  hard on diet activity  2. Other hyperlipidemia Continue medication.  No need to do lab work currently  3. Hyperglycemia A1c looks good prediabetes continue current measures - POCT HgB A1C  4. Screening for colon cancer Patient defers on colonoscopy do stool test - IFOBT POC (occult bld, rslt in office); Future
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
## 2020-04-09 ENCOUNTER — Telehealth: Payer: Self-pay

## 2020-04-09 DIAGNOSIS — R7303 Prediabetes: Secondary | ICD-10-CM

## 2020-04-09 DIAGNOSIS — Z79899 Other long term (current) drug therapy: Secondary | ICD-10-CM

## 2020-04-09 DIAGNOSIS — I10 Essential (primary) hypertension: Secondary | ICD-10-CM

## 2020-04-09 DIAGNOSIS — E7849 Other hyperlipidemia: Secondary | ICD-10-CM

## 2020-04-09 NOTE — Telephone Encounter (Signed)
Pt rescheduled for 1/14 needs blood work reordered.  Pt call back [phone]

## 2020-04-09 NOTE — Telephone Encounter (Signed)
I would recommend A1c, met 7, lipid Hyperlipidemia, prediabetes, high risk med

## 2020-04-09 NOTE — Telephone Encounter (Signed)
Last labs completed 11/2019 urine micro, bmet, hepatic and lipid. Please advise. Thank you

## 2020-04-13 NOTE — Telephone Encounter (Signed)
Blood work ordered in Epic. Patient notified. 

## 2020-06-12 DIAGNOSIS — I10 Essential (primary) hypertension: Secondary | ICD-10-CM | POA: Diagnosis not present

## 2020-06-12 DIAGNOSIS — E7849 Other hyperlipidemia: Secondary | ICD-10-CM | POA: Diagnosis not present

## 2020-06-12 DIAGNOSIS — R7303 Prediabetes: Secondary | ICD-10-CM | POA: Diagnosis not present

## 2020-06-12 DIAGNOSIS — Z79899 Other long term (current) drug therapy: Secondary | ICD-10-CM | POA: Diagnosis not present

## 2020-06-13 LAB — LIPID PANEL
Chol/HDL Ratio: 3 ratio (ref 0.0–4.4)
Cholesterol, Total: 159 mg/dL (ref 100–199)
HDL: 53 mg/dL (ref >39)
LDL Chol Calc (NIH): 77 mg/dL (ref 0–99)
Triglycerides: 174 mg/dL — ABNORMAL HIGH (ref 0–149)
VLDL Cholesterol Cal: 29 mg/dL (ref 5–40)

## 2020-06-13 LAB — HEMOGLOBIN A1C
Est. average glucose Bld gHb Est-mCnc: 140 mg/dL
Hgb A1c MFr Bld: 6.5 % — ABNORMAL HIGH (ref 4.8–5.6)

## 2020-06-13 LAB — BASIC METABOLIC PANEL
BUN/Creatinine Ratio: 20 (ref 12–28)
BUN: 21 mg/dL (ref 8–27)
CO2: 23 mmol/L (ref 20–29)
Calcium: 9.9 mg/dL (ref 8.7–10.3)
Chloride: 104 mmol/L (ref 96–106)
Creatinine, Ser: 1.06 mg/dL — ABNORMAL HIGH (ref 0.57–1.00)
GFR calc Af Amer: 63 mL/min/{1.73_m2} (ref >59)
GFR calc non Af Amer: 54 mL/min/{1.73_m2} — ABNORMAL LOW (ref >59)
Glucose: 117 mg/dL — ABNORMAL HIGH (ref 65–99)
Potassium: 4.6 mmol/L (ref 3.5–5.2)
Sodium: 140 mmol/L (ref 134–144)

## 2020-06-17 ENCOUNTER — Other Ambulatory Visit: Payer: Self-pay

## 2020-06-17 ENCOUNTER — Other Ambulatory Visit: Payer: Self-pay | Admitting: Family Medicine

## 2020-06-17 DIAGNOSIS — Z1211 Encounter for screening for malignant neoplasm of colon: Secondary | ICD-10-CM

## 2020-06-17 LAB — IFOBT (OCCULT BLOOD): IFOBT: POSITIVE

## 2020-06-18 ENCOUNTER — Ambulatory Visit: Payer: PPO | Admitting: Family Medicine

## 2020-06-18 ENCOUNTER — Other Ambulatory Visit: Payer: Self-pay | Admitting: Family Medicine

## 2020-06-18 DIAGNOSIS — K921 Melena: Secondary | ICD-10-CM

## 2020-06-19 ENCOUNTER — Telehealth: Payer: Self-pay | Admitting: *Deleted

## 2020-06-19 ENCOUNTER — Other Ambulatory Visit: Payer: Self-pay

## 2020-06-19 ENCOUNTER — Telehealth (INDEPENDENT_AMBULATORY_CARE_PROVIDER_SITE_OTHER): Payer: PPO | Admitting: Family Medicine

## 2020-06-19 DIAGNOSIS — E7849 Other hyperlipidemia: Secondary | ICD-10-CM | POA: Diagnosis not present

## 2020-06-19 DIAGNOSIS — R195 Other fecal abnormalities: Secondary | ICD-10-CM | POA: Insufficient documentation

## 2020-06-19 DIAGNOSIS — I10 Essential (primary) hypertension: Secondary | ICD-10-CM

## 2020-06-19 DIAGNOSIS — R7303 Prediabetes: Secondary | ICD-10-CM

## 2020-06-19 MED ORDER — LOSARTAN POTASSIUM 50 MG PO TABS: 50.0000 mg | ORAL_TABLET | Freq: Every morning | ORAL | 1 refills | Status: DC

## 2020-06-19 MED ORDER — ROSUVASTATIN CALCIUM 10 MG PO TABS: 10.0000 mg | ORAL_TABLET | Freq: Every day | ORAL | 1 refills | Status: DC

## 2020-06-19 NOTE — Progress Notes (Signed)
   Subjective:    Patient ID: Patricia Fuller, female    DOB: [birth year], 70 y.o.   MRN: [id number]  Hypertension This is a chronic problem. The current episode started more than 1 year ago. Risk factors for coronary artery disease include dyslipidemia and post-menopausal state. Treatments tried: cozaar. There are no compliance problems.   Essential hypertension, benign  Prediabetes  Other hyperlipidemia  Heme positive stool    Virtual Visit via Telephone Note  I connected with Patricia Fuller on 06/19/20 at  8:40 AM EST by telephone and verified that I am speaking with the correct person using two identifiers.  Location: Patient: home Provider: office   I discussed the limitations, risks, security and privacy concerns of performing an evaluation and management service by telephone and the availability of in person appointments. I also discussed with the patient that there may be a patient responsible charge related to this service. The patient expressed understanding and agreed to proceed.   History of Present Illness:    Observations/Objective:   Assessment and Plan:   Follow Up Instructions:    I discussed the assessment and treatment plan with the patient. The patient was provided an opportunity to ask questions and all were answered. The patient agreed with the plan and demonstrated an understanding of the instructions.   The patient was advised to call back or seek an in-person evaluation if the symptoms worsen or if the condition fails to improve as anticipated.  I provided 20 minutes of non-face-to-face time during this encounter.      Review of Systems Denies any chest tightness pressure pain shortness of breath denies gross blood in the stool    Objective:   Physical Exam Today's visit was via telephone Physical exam was not possible for this visit        Assessment & Plan:  diagmed 1. Essential hypertension, benign Patient relates under good control  continue medication  2. Prediabetes A1c 6.5 she would like to work really hard on diet and then recheck her labs in the several months if not doing better at that point in time then she would consent to going on diabetic medicine such as metformin  3. Other hyperlipidemia Adjust Crestor to get LDL below 70  4. Heme positive stool Referral to gastroenterology for colonoscopy prefers to [facility]

## 2020-06-19 NOTE — Addendum Note (Signed)
Addended by: [name] on: 06/19/2020 04:41 PM   Modules accepted: Orders

## 2020-06-19 NOTE — Telephone Encounter (Signed)
Patricia Fuller, Patricia Fuller are scheduled for a virtual visit with your provider today.    Just as we do with appointments in the office, we must obtain your consent to participate.  Your consent will be active for this visit and any virtual visit you may have with one of our providers in the next 365 days.    If you have a MyChart account, I can also send a copy of this consent to you electronically.  All virtual visits are billed to your insurance company just like a traditional visit in the office.  As this is a virtual visit, video technology does not allow for your provider to perform a traditional examination.  This may limit your provider's ability to fully assess your condition.  If your provider identifies any concerns that need to be evaluated in person or the need to arrange testing such as labs, EKG, etc, we will make arrangements to do so.    Although advances in technology are sophisticated, we cannot ensure that it will always work on either your end or our end.  If the connection with a video visit is poor, we may have to switch to a telephone visit.  With either a video or telephone visit, we are not always able to ensure that we have a secure connection.   I need to obtain your verbal consent now.   Are you willing to proceed with your visit today?   Patricia Fuller has provided verbal consent on 06/19/2020 for a virtual visit (video or telephone).

## 2020-06-19 NOTE — Progress Notes (Signed)
06/19/20-lab orders placed and will be mailed to patient.

## 2020-06-25 ENCOUNTER — Other Ambulatory Visit: Payer: Self-pay | Admitting: Family Medicine

## 2020-06-25 ENCOUNTER — Other Ambulatory Visit: Payer: Self-pay | Admitting: *Deleted

## 2020-06-25 MED ORDER — VALSARTAN 80 MG PO TABS: 80.0000 mg | ORAL_TABLET | Freq: Every day | ORAL | 1 refills | Status: DC

## 2020-06-25 NOTE — Progress Notes (Signed)
valsarta

## 2020-06-25 NOTE — Telephone Encounter (Signed)
Please see if valsartan 80 mg is available.  If so we will go with valsartan 80 mg, #90, 1 daily, 1 refill Explained to the patient that this medication is very similar to losartan should do well for her Also it is not unusual for pharmacies to run short of some of the medications ever since pandemic has occurred She should do fine with this medicine follow-up by summer

## 2020-06-25 NOTE — Telephone Encounter (Signed)
Patient notified of medication change and med was sent to pharmacy.

## 2020-06-29 ENCOUNTER — Telehealth: Payer: Self-pay | Admitting: *Deleted

## 2020-06-29 ENCOUNTER — Encounter: Payer: Self-pay | Admitting: Internal Medicine

## 2020-06-29 ENCOUNTER — Other Ambulatory Visit: Payer: Self-pay | Admitting: *Deleted

## 2020-06-29 NOTE — Telephone Encounter (Signed)
I agree, resume losartan

## 2020-06-29 NOTE — Telephone Encounter (Signed)
See phone note from 1/20. Pharm states losartan was on back order. Dr Nicki Reaper changed to valsartan. Pt called and states pharm gave her both meds when she went to pharm 2 days ago. She was calling to see what she is suppose to take. I told pt to not take valsartan and to continue losartan that she had been on. Explained that the change was because pharm said they was out of losartan. She verbalized understanding and med list update.

## 2020-07-01 ENCOUNTER — Telehealth: Payer: Self-pay

## 2020-07-01 NOTE — Telephone Encounter (Signed)
Dropped off Disability Parking Form placed in Dr Gerlene Burdock folder

## 2020-07-02 NOTE — Telephone Encounter (Signed)
This was signed thank you 

## 2020-07-13 ENCOUNTER — Other Ambulatory Visit: Payer: Self-pay

## 2020-07-13 ENCOUNTER — Encounter: Payer: Self-pay | Admitting: Family Medicine

## 2020-07-13 ENCOUNTER — Ambulatory Visit (INDEPENDENT_AMBULATORY_CARE_PROVIDER_SITE_OTHER): Payer: PPO | Admitting: Family Medicine

## 2020-07-13 VITALS — HR 93 | Temp 98.1°F | Resp 16

## 2020-07-13 DIAGNOSIS — B349 Viral infection, unspecified: Secondary | ICD-10-CM | POA: Diagnosis not present

## 2020-07-13 DIAGNOSIS — U071 COVID-19: Secondary | ICD-10-CM

## 2020-07-13 DIAGNOSIS — J988 Other specified respiratory disorders: Secondary | ICD-10-CM | POA: Insufficient documentation

## 2020-07-13 MED ORDER — ALBUTEROL SULFATE HFA 108 (90 BASE) MCG/ACT IN AERS: 2.0000 | INHALATION_SPRAY | Freq: Four times a day (QID) | RESPIRATORY_TRACT | 0 refills | Status: DC | PRN

## 2020-07-13 NOTE — Progress Notes (Signed)
Patient ID: Patricia Fuller, female    DOB: 10-13-1950, 70 y.o.   MRN: [id number]   Chief Complaint  Patient presents with  . Cough    Congestion, body aches and wheezing since Thursday No Covid test   Subjective:  CC: cough, congestion, body aches, wheezing  This is a new problem.  Presents today for an acute visit with a complaint of cough, congestion, body aches and wheezing.  Symptoms started on Thursday.  Does not have a history of asthma, thought she was getting better over the weekend but now not so much.  Has been using Mucinex over-the-counter which is helping some but only used it yesterday.  Reports that she is staying hydrated, taking adequate fluids.  Denies fever, chills, fatigue associated symptoms include congestion, sinus pain and pressure, cough, wheezing and diarrhea.  Diarrhea has resolved.    Medical History Umi has a past medical history of Hyperlipidemia, Hypertension, Prediabetes, and Renal calculi.   Outpatient Encounter Medications as of 07/13/2020  Medication Sig  . albuterol (VENTOLIN HFA) 108 (90 Base) MCG/ACT inhaler Inhale 2 puffs into the lungs every 6 (six) hours as needed for wheezing or shortness of breath.  Marland Kitchen acetaminophen (TYLENOL) 500 MG tablet Take 500 mg by mouth every 6 (six) hours as needed.  Marland Kitchen aspirin 81 MG tablet Take 1 tablet (81 mg total) by mouth daily.  Marland Kitchen losartan (COZAAR) 50 MG tablet Take 1 tablet (50 mg total) by mouth every morning.  . rosuvastatin (CRESTOR) 10 MG tablet Take 1 tablet (10 mg total) by mouth daily.   No facility-administered encounter medications on file as of 07/13/2020.     Review of Systems  Constitutional: Negative for chills, fatigue and fever.  HENT: Positive for congestion, sinus pressure and sinus pain. Negative for ear pain and sore throat.   Respiratory: Positive for cough and wheezing. Negative for chest tightness and shortness of breath.   Cardiovascular: Negative for chest pain.  Gastrointestinal: Positive  for diarrhea. Negative for abdominal pain, nausea and vomiting.       Resolved  Neurological: Negative for dizziness, light-headedness and headaches.     Vitals Pulse 93   Temp 98.1 F (36.7 C)   Resp 16   SpO2 96%   Objective:   Physical Exam Vitals reviewed.  Constitutional:      General: She is not in acute distress.    Appearance: Normal appearance.  HENT:     Right Ear: Tympanic membrane normal.     Left Ear: Tympanic membrane normal.     Nose: Nose normal.     Mouth/Throat:     Mouth: Mucous membranes are moist.  Cardiovascular:     Rate and Rhythm: Normal rate and regular rhythm.     Heart sounds: Normal heart sounds.  Pulmonary:     Effort: Pulmonary effort is normal.     Breath sounds: Normal breath sounds. No wheezing.  Skin:    General: Skin is warm and dry.  Neurological:     General: No focal deficit present.     Mental Status: She is alert.  Psychiatric:        Behavior: Behavior normal.      Assessment and Plan   1. Viral infection - Novel Coronavirus, NAA ([facility])  2. Wheezing-associated respiratory infection - albuterol (VENTOLIN HFA) 108 (90 Base) MCG/ACT inhaler; Inhale 2 puffs into the lungs every 6 (six) hours as needed for wheezing or shortness of breath.  Dispense: 8 g; Refill: 0 -  Novel Coronavirus, NAA ([facility])   No wheezing heard today. Will give inhaler for home use.  Recommend supportive therapy, adequate hydration, OTC medications for symptom relief.   Agrees with plan of care discussed today. Understands warning signs to seek further care: chest pain, shortness of breath, any significant change in health.  Understands to follow-up if symptoms do not improve or worsen. Will notify once Covid results are available.  She will let us know if her symptoms are progressing, symptoms appear mild today.  Covid-19 warning:  Covid-19 is a virus that causes hypoxia (low oxygen level in blood) in some people. If you develop any changes  in your usual breathing pattern: difficulty catching your breath, more short winded with activity or with resting, or anything that concerns you about your breathing, do not hesitate to go to the emergency department immediately for evaluation. Covid infection can also affect the way the brain functions if it lacks oxygen, such as, feeling dizzy, passing out, or feeling confused, if you experience any of these symptoms, please do not delay to seek treatment.  Some people experience gastrointestinal problems with Covid, such as vomiting and diarrhea, dehydration is a serious risk and should be avoided. If you are unable to keep liquids down you may need to go to the emergency department for intravenous fluids to avoid dehydration.   Please alert and involve your family and/or friends to help keep an eye on you while you recover from Covid-19. If you have any questions or concerns about your recovery, please do not hesitate to call the office for guidance.   Recommend supportive therapy while you are recovering:   1) Get lots of rest.  2) Take over the counter pain medication if needed, such as acetaminophen or ibuprofen. Read and follow instructions on the label and make sure not to combine other medications that may have same ingredients in it. It is important to not take too much of these ingredients.  3) Drink plenty of caffeine-free fluids. (If you have heart or kidney problems, follow the instructions of your specialist regarding amounts).  4) If you are hungry, eat a bland diet, such as the BRAT diet (bananas, rice, applesauce, toast).  5) Let us know if you are not feeling better in a week.   Pecolia Ades, NP 07/13/20

## 2020-07-13 NOTE — Patient Instructions (Signed)
Covid-19 warning:  Covid-19 is a virus that causes hypoxia (low oxygen level in blood) in some people. If you develop any changes in your usual breathing pattern: difficulty catching your breath, more short winded with activity or with resting, or anything that concerns you about your breathing, do not hesitate to go to the emergency department immediately for evaluation. Covid infection can also affect the way the brain functions if it lacks oxygen, such as, feeling dizzy, passing out, or feeling confused, if you experience any of these symptoms, please do not delay to seek treatment.  Some people experience gastrointestinal problems with Covid, such as vomiting and diarrhea, dehydration is a serious risk and should be avoided. If you are unable to keep liquids down you may need to go to the emergency department for intravenous fluids to avoid dehydration.   Please alert and involve your family and/or friends to help keep an eye on you while you recover from Covid-19. If you have any questions or concerns about your recovery, please do not hesitate to call the office for guidance.   Covid-19 Quarantine Instructions:   You have tested positive for Covid-19 infection. The current CDC guidelines for quarantine regardless of vaccination status are:   Please quarantine and isolate at home for a minimum of  5 days.   - If you have no symptoms or your symptoms are resolving after 5 days you   can leave the home (resolving means no shortness of breath, no fever, without taking fever reducing medication, no headache, etc). -Continue to wear a mask around others for an additional 5 days.  -If you were severely ill with Covid-19 you should isolate for at least 10 days.    Use over-the-counter medications for symptoms.If you develop respiratory issues/distress (see Covid warning), seek medical care in the Emergency Department.  If you must leave home or if you have to be around others please wear a mask.  Please limit contact with immediate family members in the home, practice social distancing, frequent handwashing and clean hard surfaces touched frequently with household cleaning products. Members of your household will also need to quarantine for 5 days and test on day five if possible.  Covid-19 warning:  Covid-19 is a virus that causes hypoxia (low oxygen level in blood) in some people. If you develop any changes in your usual breathing pattern: difficulty catching your breath, more short winded with activity or with resting, or anything that concerns you about your breathing, do not hesitate to go to the emergency department immediately for evaluation. Covid infection can also affect the way the brain functions if it lacks oxygen, such as, feeling dizzy, passing out, or feeling confused, if you experience any of these symptoms, please do not delay to seek treatment.  Some people experience gastrointestinal problems with Covid, such as vomiting and diarrhea, dehydration is a serious risk and should be avoided. If you are unable to keep liquids down you may need to go to the emergency department for intravenous fluids to avoid dehydration.   Please alert and involve your family and/or friends to help keep an eye on you while you recover from Covid-19. If you have any questions or concerns about your recovery, please do not hesitate to call the office for guidance.    Recommend supportive therapy while you are recovering:   1) Get lots of rest.  2) Take over the counter pain medication if needed, such as acetaminophen or ibuprofen. Read and follow instructions on the label   and make sure not to combine other medications that may have same ingredients in it. It is important to not take too much of these ingredients.  3) Drink plenty of caffeine-free fluids. (If you have heart or kidney problems, follow the instructions of your specialist regarding amounts).  4) If you are hungry, eat a bland diet,  such as the BRAT diet (bananas, rice, applesauce, toast).  5) Let us know if you are not feeling better in a week.  

## 2020-07-14 LAB — SPECIMEN STATUS REPORT

## 2020-07-14 LAB — SARS-COV-2, NAA 2 DAY TAT

## 2020-07-14 LAB — NOVEL CORONAVIRUS, NAA: SARS-CoV-2, NAA: DETECTED — AB

## 2020-07-15 ENCOUNTER — Telehealth: Payer: Self-pay | Admitting: Infectious Diseases

## 2020-07-15 NOTE — Addendum Note (Signed)
Addended by: Dairl Ponder on: 07/15/2020 08:50 AM   Modules accepted: Orders

## 2020-07-15 NOTE — Telephone Encounter (Signed)
Called to discuss with patient about COVID-19 symptoms and the use of one of the available treatments for those with mild to moderate Covid symptoms and at a high risk of hospitalization.  Pt appears to qualify for outpatient treatment due to co-morbid conditions and/or a member of an at-risk group in accordance with the FDA Emergency Use Authorization.    Symptom onset: 2/3 Vaccinated: no Booster? no Immunocompromised? no Qualifiers: Age 67y   Has had some diarrhea and cough but that has all pretty much resolved. Just "up and down now". Does sometimes have trouble with breathing but it is not bad, just intermittent.   We spoke a little about monoclonal therapy but decided it was not worth the side effects for her. We talked about symptomatic care for now, precautions as to what to look for regarding concerning features for pneumonia (which she has none) and to keep in touch with her PCP.   Janene Madeira, NP

## 2020-07-19 ENCOUNTER — Emergency Department (HOSPITAL_COMMUNITY)
Admission: EM | Admit: 2020-07-19 | Discharge: 2020-07-19 | Disposition: A | Discharge status: Home / Self Care | Payer: PPO | Attending: Emergency Medicine | Admitting: Emergency Medicine

## 2020-07-19 ENCOUNTER — Other Ambulatory Visit: Payer: Self-pay

## 2020-07-19 ENCOUNTER — Encounter (HOSPITAL_COMMUNITY): Payer: Self-pay | Admitting: Emergency Medicine

## 2020-07-19 DIAGNOSIS — R109 Unspecified abdominal pain: Secondary | ICD-10-CM | POA: Diagnosis not present

## 2020-07-19 DIAGNOSIS — Z7982 Long term (current) use of aspirin: Secondary | ICD-10-CM | POA: Insufficient documentation

## 2020-07-19 DIAGNOSIS — Z79899 Other long term (current) drug therapy: Secondary | ICD-10-CM | POA: Diagnosis not present

## 2020-07-19 DIAGNOSIS — U071 COVID-19: Secondary | ICD-10-CM

## 2020-07-19 DIAGNOSIS — E860 Dehydration: Secondary | ICD-10-CM | POA: Diagnosis not present

## 2020-07-19 DIAGNOSIS — I10 Essential (primary) hypertension: Secondary | ICD-10-CM | POA: Diagnosis not present

## 2020-07-19 DIAGNOSIS — R197 Diarrhea, unspecified: Secondary | ICD-10-CM | POA: Diagnosis not present

## 2020-07-19 DIAGNOSIS — R112 Nausea with vomiting, unspecified: Secondary | ICD-10-CM | POA: Diagnosis not present

## 2020-07-19 LAB — CBC WITH DIFFERENTIAL/PLATELET
Abs Immature Granulocytes: 0.03 10*3/uL (ref 0.00–0.07)
Basophils Absolute: 0 10*3/uL (ref 0.0–0.1)
Basophils Relative: 0 %
Eosinophils Absolute: 0 10*3/uL (ref 0.0–0.5)
Eosinophils Relative: 0 %
HCT: 47.9 % — ABNORMAL HIGH (ref 36.0–46.0)
Hemoglobin: 15.7 g/dL — ABNORMAL HIGH (ref 12.0–15.0)
Immature Granulocytes: 0 %
Lymphocytes Relative: 33 %
Lymphs Abs: 2.2 10*3/uL (ref 0.7–4.0)
MCH: 29.1 pg (ref 26.0–34.0)
MCHC: 32.8 g/dL (ref 30.0–36.0)
MCV: 88.9 fL (ref 80.0–100.0)
Monocytes Absolute: 0.6 10*3/uL (ref 0.1–1.0)
Monocytes Relative: 9 %
Neutro Abs: 3.9 10*3/uL (ref 1.7–7.7)
Neutrophils Relative %: 58 %
Platelets: 161 10*3/uL (ref 150–400)
RBC: 5.39 MIL/uL — ABNORMAL HIGH (ref 3.87–5.11)
RDW: 13.7 % (ref 11.5–15.5)
WBC: 6.8 10*3/uL (ref 4.0–10.5)
nRBC: 0 % (ref 0.0–0.2)

## 2020-07-19 LAB — COMPREHENSIVE METABOLIC PANEL
ALT: 23 U/L (ref 0–44)
AST: 27 U/L (ref 15–41)
Albumin: 3.9 g/dL (ref 3.5–5.0)
Alkaline Phosphatase: 61 U/L (ref 38–126)
Anion gap: 10 (ref 5–15)
BUN: 17 mg/dL (ref 8–23)
CO2: 20 mmol/L — ABNORMAL LOW (ref 22–32)
Calcium: 8.9 mg/dL (ref 8.9–10.3)
Chloride: 103 mmol/L (ref 98–111)
Creatinine, Ser: 0.89 mg/dL (ref 0.44–1.00)
GFR, Estimated: >60 mL/min (ref >60)
Glucose, Bld: 117 mg/dL — ABNORMAL HIGH (ref 70–99)
Potassium: 3.4 mmol/L — ABNORMAL LOW (ref 3.5–5.1)
Sodium: 133 mmol/L — ABNORMAL LOW (ref 135–145)
Total Bilirubin: 1.1 mg/dL (ref 0.3–1.2)
Total Protein: 7.5 g/dL (ref 6.5–8.1)

## 2020-07-19 LAB — LIPASE, BLOOD: Lipase: 38 U/L (ref 11–51)

## 2020-07-19 MED ORDER — ONDANSETRON HCL 4 MG/2ML IJ SOLN
4.0000 mg | Freq: Once | INTRAMUSCULAR | Status: AC
  Administered 2020-07-19: 4 mg via INTRAVENOUS
  Filled 2020-07-19: qty 2

## 2020-07-19 MED ORDER — ONDANSETRON 4 MG PO TBDP: 4.0000 mg | ORAL_TABLET | Freq: Three times a day (TID) | ORAL | 0 refills | Status: DC | PRN

## 2020-07-19 MED ORDER — SODIUM CHLORIDE 0.9 % IV BOLUS
1000.0000 mL | Freq: Once | INTRAVENOUS | Status: AC
  Administered 2020-07-19: 1000 mL via INTRAVENOUS

## 2020-07-19 NOTE — ED Triage Notes (Signed)
Pt c/o Covid positive test on Wednesday last week and states she is unable to keep food down.  She called the nurse hotline and was informed to come to the ED.

## 2020-07-19 NOTE — ED Provider Notes (Signed)
[facility] Provider Note   CSN: [id number] Arrival date & time: 07/19/20  1758     History Chief Complaint  Patient presents with  . Covid Positive    Patricia Fuller is a 70 y.o. female.  HPI Patient presents with nausea vomiting some diarrhea.  Had positive Covid test on the third.  States her last few days has had vomiting some diarrhea and unable to really keep anything down.  States she ate 2 bites of potato last night and had a little chicken soup today both which came back up.  Mild abdominal pain.  States she feels weak.  States really not having any respiratory issues however.  She is unvaccinated.  No blood in the emesis or diarrhea.    Past Medical History:  Diagnosis Date  . Hyperlipidemia   . Hypertension   . Prediabetes   . Renal calculi     Patient Active Problem List   Diagnosis Date Noted  . Viral infection 07/13/2020  . Wheezing-associated respiratory infection 07/13/2020  . Heme positive stool 06/19/2020  . Primary osteoarthritis of both knees 08/26/2015  . Hyperlipidemia 08/26/2015  . Hyperglycemia 08/26/2015  . Peripheral neuropathy 09/16/2014  . Pelvic lymphadenopathy 12/26/2013  . Essential hypertension, benign 09/26/2012  . Other and unspecified hyperlipidemia 09/26/2012  . Prediabetes 09/26/2012    Past Surgical History:  Procedure Laterality Date  . COLONOSCOPY  02/2005   small polyp  . ESOPHAGOGASTRODUODENOSCOPY  02/2005     OB History   No obstetric history on file.     Family History  Problem Relation Age of Onset  . Hypertension Mother   . Diabetes Mother   . Heart disease Mother     Social History   Tobacco Use  . Smoking status: Never Smoker  . Smokeless tobacco: Never Used  Vaping Use  . Vaping Use: Never used  Substance Use Topics  . Alcohol use: No  . Drug use: No    Home Medications Prior to Admission medications   Medication Sig Start Date End Date Taking? Authorizing Provider   ondansetron (ZOFRAN-ODT) 4 MG disintegrating tablet Take 1 tablet (4 mg total) by mouth every 8 (eight) hours as needed for nausea or vomiting. 07/19/20  Yes Davonna Belling, MD  acetaminophen (TYLENOL) 500 MG tablet Take 500 mg by mouth every 6 (six) hours as needed.    [provider]  albuterol (VENTOLIN HFA) 108 (90 Base) MCG/ACT inhaler Inhale 2 puffs into the lungs every 6 (six) hours as needed for wheezing or shortness of breath. 07/13/20   Chalmers Guest, NP  aspirin 81 MG tablet Take 1 tablet (81 mg total) by mouth daily. 04/10/14   Kathyrn Drown, MD  losartan (COZAAR) 50 MG tablet Take 1 tablet (50 mg total) by mouth every morning. 06/19/20   Kathyrn Drown, MD  rosuvastatin (CRESTOR) 10 MG tablet Take 1 tablet (10 mg total) by mouth daily. 06/19/20   Kathyrn Drown, MD    Allergies    Patient has no known allergies.  Review of Systems   Review of Systems  Constitutional: Positive for appetite change.  HENT: Negative for congestion.   Respiratory: Negative for shortness of breath.   Gastrointestinal: Positive for diarrhea, nausea and vomiting.  Genitourinary: Negative for flank pain.  Musculoskeletal: Negative for back pain.  Skin: Negative for rash.  Neurological: Negative for weakness.  Psychiatric/Behavioral: Negative for confusion.    Physical Exam Updated Vital Signs BP 104/72   Pulse  67   Temp 99 F (37.2 C) (Oral)   Resp 18   Ht 5\' 6"  (1.676 m)   Wt 104.3 kg   SpO2 96%   BMI 37.12 kg/m   Physical Exam Vitals and nursing note reviewed.  HENT:     Head: Atraumatic.     Mouth/Throat:     Mouth: Mucous membranes are moist.  Eyes:     Pupils: Pupils are equal, round, and reactive to light.  Cardiovascular:     Rate and Rhythm: Regular rhythm.  Pulmonary:     Breath sounds: No wheezing or rhonchi.  Abdominal:     Tenderness: There is no abdominal tenderness.  Musculoskeletal:     Cervical back: Neck supple.  Skin:    General: Skin is warm.      Capillary Refill: Capillary refill takes less than 2 seconds.  Neurological:     Mental Status: She is alert and oriented to person, place, and time.  Psychiatric:        Mood and Affect: Mood normal.     ED Results / Procedures / Treatments   Labs (all labs ordered are listed, but only abnormal results are displayed) Labs Reviewed  COMPREHENSIVE METABOLIC PANEL - Abnormal; Notable for the following components:      Result Value   Sodium 133 (*)    Potassium 3.4 (*)    CO2 20 (*)    Glucose, Bld 117 (*)    All other components within normal limits  CBC WITH DIFFERENTIAL/PLATELET - Abnormal; Notable for the following components:   RBC 5.39 (*)    Hemoglobin 15.7 (*)    HCT 47.9 (*)    All other components within normal limits  LIPASE, BLOOD    EKG None  Radiology No results found.  Procedures Procedures   Medications Ordered in ED Medications  sodium chloride 0.9 % bolus 1,000 mL (0 mLs Intravenous Stopped 07/19/20 1958)  ondansetron (ZOFRAN) injection 4 mg (4 mg Intravenous Given 07/19/20 1846)    ED Course  I have reviewed the triage vital signs and the nursing notes.  Pertinent labs & imaging results that were available during my care of the patient were reviewed by me and considered in my medical decision making (see chart for details).    MDM Rules/Calculators/A&P                          Patient presents with known Covid infection.  Has had around 2 weeks of symptoms.  Now has nausea vomiting diarrhea.  Mild dehydration on lab work.  Fluid bolus given.  Zofran given patient feels somewhat better and is tolerated orals.  Lungs clear.  Not hypoxic.  I feels that the patient stable for discharge.  Outpatient follow-up as needed Final Clinical Impression(s) / ED Diagnoses Final diagnoses:  COVID-19  Nausea vomiting and diarrhea    Rx / DC Orders ED Discharge Orders         Ordered    ondansetron (ZOFRAN-ODT) 4 MG disintegrating tablet  Every 8 hours  PRN        07/19/20 2100           Davonna Belling, MD 07/19/20 2239

## 2020-07-19 NOTE — ED Notes (Signed)
Pt tolerated fluids well.  

## 2020-08-03 ENCOUNTER — Telehealth: Payer: PPO | Admitting: Family Medicine

## 2020-08-05 ENCOUNTER — Other Ambulatory Visit: Payer: Self-pay

## 2020-08-05 ENCOUNTER — Ambulatory Visit: Payer: PPO | Admitting: Nurse Practitioner

## 2020-08-05 ENCOUNTER — Telehealth (INDEPENDENT_AMBULATORY_CARE_PROVIDER_SITE_OTHER): Payer: PPO | Admitting: Family Medicine

## 2020-08-05 ENCOUNTER — Telehealth: Payer: Self-pay | Admitting: Family Medicine

## 2020-08-05 DIAGNOSIS — R5383 Other fatigue: Secondary | ICD-10-CM | POA: Diagnosis not present

## 2020-08-05 DIAGNOSIS — Z8616 Personal history of COVID-19: Secondary | ICD-10-CM

## 2020-08-05 NOTE — Progress Notes (Signed)
   Subjective:    Patient ID: Patricia Fuller, female    DOB: 1951/01/09, 70 y.o.   MRN: [id number]  HPI Pt went to [facility] ED on 07/19/20 for positive COVID. Pt states she is feeling alright today; trying to get her strength back.   Virtual Visit via Telephone Note  I connected with Patricia Fuller on 08/05/20 at 11:00 AM EST by telephone and verified that I am speaking with the correct person using two identifiers.  Location: Patient: home Provider: office   I discussed the limitations, risks, security and privacy concerns of performing an evaluation and management service by telephone and the availability of in person appointments. I also discussed with the patient that there may be a patient responsible charge related to this service. The patient expressed understanding and agreed to proceed.   History of Present Illness:    Observations/Objective:   Assessment and Plan:   Follow Up Instructions:    I discussed the assessment and treatment plan with the patient. The patient was provided an opportunity to ask questions and all were answered. The patient agreed with the plan and demonstrated an understanding of the instructions.   The patient was advised to call back or seek an in-person evaluation if the symptoms worsen or if the condition fails to improve as anticipated.  I provided 20 minutes of non-face-to-face time during this encounter.     Patient feels her energy is doing well breathing doing well eating drinking okay  Review of Systems     Objective:   Physical Exam  Today's visit was via telephone Physical exam was not possible for this visit       Assessment & Plan:  Patient is gradually getting better She is willing to take the Covid vaccine in approximately 90 days She is resuming her normal medicines She will plan to follow-up with [place] for in person visit later this spring

## 2020-08-05 NOTE — Telephone Encounter (Signed)
Patricia Fuller, Patricia Fuller are scheduled for a virtual visit with your provider today.    Just as we do with appointments in the office, we must obtain your consent to participate.  Your consent will be active for this visit and any virtual visit you may have with one of our providers in the next 365 days.    If you have a MyChart account, I can also send a copy of this consent to you electronically.  All virtual visits are billed to your insurance company just like a traditional visit in the office.  As this is a virtual visit, video technology does not allow for your provider to perform a traditional examination.  This may limit your provider's ability to fully assess your condition.  If your provider identifies any concerns that need to be evaluated in person or the need to arrange testing such as labs, EKG, etc, we will make arrangements to do so.    Although advances in technology are sophisticated, we cannot ensure that it will always work on either your end or our end.  If the connection with a video visit is poor, we may have to switch to a telephone visit.  With either a video or telephone visit, we are not always able to ensure that we have a secure connection.   I need to obtain your verbal consent now.   Are you willing to proceed with your visit today?   Patricia Fuller has provided verbal consent on 08/05/2020 for a virtual visit (video or telephone).   [name], LPN 08/10/6281  1:51 AM

## 2020-09-09 ENCOUNTER — Encounter: Payer: Self-pay | Admitting: Nurse Practitioner

## 2020-09-09 ENCOUNTER — Ambulatory Visit: Payer: PPO | Admitting: Nurse Practitioner

## 2020-09-09 ENCOUNTER — Telehealth: Payer: Self-pay | Admitting: *Deleted

## 2020-09-09 ENCOUNTER — Other Ambulatory Visit: Payer: Self-pay

## 2020-09-09 VITALS — BP 143/80 | HR 76 | Temp 96.9°F | Ht 66.0 in | Wt 229.2 lb

## 2020-09-09 DIAGNOSIS — R195 Other fecal abnormalities: Secondary | ICD-10-CM

## 2020-09-09 MED ORDER — CLENPIQ 10-3.5-12 MG-GM -GM/160ML PO SOLN: 1.0000 | Freq: Once | ORAL | 0 refills | Status: AC

## 2020-09-09 NOTE — Progress Notes (Signed)
Cc'ed to pcp °

## 2020-09-09 NOTE — Telephone Encounter (Signed)
Called pt. She has been scheduled for TCS with propofol, Dr. Abbey Chatters, ASA 2 on 5/16 am appt, She wants low volume prep. Advised would send into pharmacy and if too expensive she is too call back. Advised will mail prep instructions with covid test. Offered afternoon appts but pt wanted mornings. Pt covid + 07/13/20 but procedure scheduled after 90 day window.

## 2020-09-09 NOTE — Patient Instructions (Signed)
Your health issues we discussed today were:   Blood in your stool but no previous colonoscopy: 1. As we discussed, I strongly recommend he have a colonoscopy completed 2. Further recommendations will follow your colonoscopy 3. Call us if you have any worsening or severe bleeding  Overall I recommend:  1. Continue other current medications 2. Return for follow-up based on recommendations made after your procedure, or as needed for GI problems 3. Call us for any questions or concerns   ---------------------------------------------------------------  I am glad you have gotten your COVID-19 vaccination!  Even though you are fully vaccinated you should continue to follow CDC and state/local guidelines.  ---------------------------------------------------------------   At [facility] we value your feedback. You may receive a survey about your visit today. Please share your experience as we strive to create trusting relationships with our patients to provide genuine, compassionate, quality care.  We appreciate your understanding and patience as we review any laboratory studies, imaging, and other diagnostic tests that are ordered as we care for you. Our office policy is 5 business days for review of these results, and any emergent or urgent results are addressed in a timely manner for your best interest. If you do not hear from our office in 1 week, please contact us.   We also encourage the use of MyChart, which contains your medical information for your review as well. If you are not enrolled in this feature, an access code is on this after visit summary for your convenience. Thank you for allowing [place] to be involved in your care.  It was great to see you today!  I hope you have a great spring!!

## 2020-09-09 NOTE — Progress Notes (Signed)
Primary Care Physician:  Kathyrn Drown, MD Primary Gastroenterologist:  Dr. Abbey Chatters  Chief Complaint  Patient presents with  . positive blood in stool    Never had tcs    HPI:   Patricia Fuller is a 70 y.o. female who presents on referral from primary care for blood in the stool.  Reviewed information associated with referral including primary care telehealth visit on 06/19/2020.  At that time the patient noted blood in her stool.  Recommended referral to GI for colonoscopy consideration.  No history of colonoscopy found in our system.  Today she states she doing okay overall. She states she hasn't seen any blood in her stool. She took two dulcolax to try and lose weight. After the bowel movement, 2-3 days later she did a stool card and it was positive. Denies abdominal pain, N/V, melena, fever, chills, unintentional weight loss. Denies URI or flu-like symptoms. Denies loss of sense of taste or smell. The patient has not received COVID-19 vaccination(s). They are planning to get vaccinated 90 days after he rCOVID infection (a month ago). Denies chest pain, dyspnea, dizziness, lightheadedness, syncope, near syncope. Denies any other upper or lower GI symptoms.  Past Medical History:  Diagnosis Date  . Hyperlipidemia   . Hypertension   . Prediabetes   . Renal calculi     Past Surgical History:  Procedure Laterality Date  . LITHOTRIPSY    . TUBAL LIGATION      Current Outpatient Medications  Medication Sig Dispense Refill  . acetaminophen (TYLENOL) 500 MG tablet Take 500 mg by mouth every 6 (six) hours as needed.    Marland Kitchen losartan (COZAAR) 50 MG tablet Take 1 tablet (50 mg total) by mouth every morning. 90 tablet 1  . rosuvastatin (CRESTOR) 10 MG tablet Take 1 tablet (10 mg total) by mouth daily. 90 tablet 1   No current facility-administered medications for this visit.    Allergies as of 09/09/2020  . (No Known Allergies)    Family History  Problem Relation Age of Onset  .  Hypertension Mother   . Diabetes Mother   . Heart disease Mother   . Colon cancer Neg Hx     Social History   Socioeconomic History  . Marital status: Single    Spouse name: Not on file  . Number of children: Not on file  . Years of education: Not on file  . Highest education level: Not on file  Occupational History  . Not on file  Tobacco Use  . Smoking status: Never Smoker  . Smokeless tobacco: Never Used  Vaping Use  . Vaping Use: Never used  Substance and Sexual Activity  . Alcohol use: No  . Drug use: No  . Sexual activity: Never  Other Topics Concern  . Not on file  Social History Narrative  . Not on file   Social Determinants of Health   Financial Resource Strain: Not on file  Food Insecurity: Not on file  Transportation Needs: Not on file  Physical Activity: Not on file  Stress: Not on file  Social Connections: Not on file  Intimate Partner Violence: Not on file    Subjective: Review of Systems  Constitutional: Negative for chills, fever, malaise/fatigue and weight loss.  HENT: Negative for congestion and sore throat.   Respiratory: Negative for cough and shortness of breath.   Cardiovascular: Negative for chest pain and palpitations.  Gastrointestinal: Negative for abdominal pain, blood in stool, constipation, diarrhea, heartburn, melena, nausea  and vomiting.  Musculoskeletal: Negative for joint pain and myalgias.  Skin: Negative for rash.  Neurological: Negative for dizziness and weakness.  Endo/Heme/Allergies: Does not bruise/bleed easily.  Psychiatric/Behavioral: Negative for depression. The patient is not nervous/anxious.   All other systems reviewed and are negative.      Objective: BP (!) 143/80   Pulse 76   Temp (!) 96.9 F (36.1 C) (Temporal)   Ht _0  (1.676 m)   Wt 229 lb 3.2 oz (104 kg)   BMI 36.99 kg/m  Physical Exam Vitals and nursing note reviewed.  Constitutional:      General: She is not in acute distress.    Appearance:  Normal appearance. She is well-developed. She is obese. She is not ill-appearing, toxic-appearing or diaphoretic.  HENT:     Head: Normocephalic and atraumatic.     Nose: No congestion or rhinorrhea.  Eyes:     General: No scleral icterus. Cardiovascular:     Rate and Rhythm: Normal rate and regular rhythm.     Heart sounds: Normal heart sounds.  Pulmonary:     Effort: Pulmonary effort is normal. No respiratory distress.     Breath sounds: Normal breath sounds.  Abdominal:     General: Bowel sounds are normal.     Palpations: Abdomen is soft. There is no hepatomegaly, splenomegaly or mass.     Tenderness: There is no abdominal tenderness. There is no guarding or rebound.     Hernia: No hernia is present.  Skin:    General: Skin is warm and dry.     Coloration: Skin is not jaundiced.     Findings: No rash.  Neurological:     General: No focal deficit present.     Mental Status: She is alert and oriented to person, place, and time.  Psychiatric:        Attention and Perception: Attention normal.        Mood and Affect: Mood normal.        Speech: Speech normal.        Behavior: Behavior normal.        Thought Content: Thought content normal.        Cognition and Memory: Cognition and memory normal.      Assessment:  Very pleasant six 65-year-old female presents for heme positive stool from primary care.  She states she has never had a colonoscopy before.  Prior to providing her sample that was heme positive she did take couple Dulcolax in an effort to "lose weight".  Although she could have a benign anorectal source, which is statistically more likely, I am concerned given her's age and no previous colonoscopy.  Cannot rule out more insidious pathology such as bleeding polyp, malignant process.  I strongly encouraged her to proceed with a colonoscopy.  She seems somewhat agreeable at this point.  She is requesting a low volume prep, which I feel is reasonable.  Further  recommendations will follow   Proceed with TCS on propofol/MAC with Dr. Abbey Chatters on propofol/MAC in near future: the risks, benefits, and alternatives have been discussed with the patient in detail. The patient states understanding and desires to proceed.  ASA II  The patient is not on any anticoagulants, anxiolytics, chronic pain medications, antidepressants, antidiabetics, or iron supplements.   Plan: 1. Colonoscopy as described above 2. Follow-up based on post procedure recommendations or as needed for GI complaints    Thank you for allowing [place] to participate in the care of [facility]  Sullivan Lone, DNP, AGNP-C Adult & Gerontological Nurse Practitioner [facility]   09/09/2020 3:23 PM   Disclaimer: This note was dictated with voice recognition software. Similar sounding words can inadvertently be transcribed and may not be corrected upon review.

## 2020-09-10 ENCOUNTER — Telehealth: Payer: Self-pay | Admitting: Internal Medicine

## 2020-09-10 NOTE — Telephone Encounter (Signed)
Call patient about prep

## 2020-09-10 NOTE — Telephone Encounter (Signed)
Called pt and answered questions. She is going to get clenpiq for prep

## 2020-10-16 ENCOUNTER — Other Ambulatory Visit: Payer: Self-pay

## 2020-10-16 ENCOUNTER — Other Ambulatory Visit (HOSPITAL_COMMUNITY)
Admission: RE | Admit: 2020-10-16 | Discharge: 2020-10-16 | Disposition: A | Discharge status: Home / Self Care | Payer: PPO | Source: Ambulatory Visit | Attending: Internal Medicine | Admitting: Internal Medicine

## 2020-10-16 DIAGNOSIS — Z20822 Contact with and (suspected) exposure to covid-19: Secondary | ICD-10-CM | POA: Diagnosis not present

## 2020-10-16 DIAGNOSIS — Z01812 Encounter for preprocedural laboratory examination: Secondary | ICD-10-CM | POA: Insufficient documentation

## 2020-10-17 LAB — SARS CORONAVIRUS 2 (TAT 6-24 HRS): SARS Coronavirus 2: NEGATIVE

## 2020-10-19 ENCOUNTER — Ambulatory Visit (HOSPITAL_COMMUNITY): Payer: PPO | Admitting: Anesthesiology

## 2020-10-19 ENCOUNTER — Encounter (HOSPITAL_COMMUNITY)
Admission: RE | Disposition: A | Discharge status: Home / Self Care | Payer: Self-pay | Source: Ambulatory Visit | Attending: Internal Medicine

## 2020-10-19 ENCOUNTER — Ambulatory Visit (HOSPITAL_COMMUNITY)
Admission: RE | Admit: 2020-10-19 | Discharge: 2020-10-19 | Disposition: A | Discharge status: Home / Self Care | Payer: PPO | Source: Ambulatory Visit | Attending: Internal Medicine | Admitting: Internal Medicine

## 2020-10-19 ENCOUNTER — Encounter (HOSPITAL_COMMUNITY): Payer: Self-pay

## 2020-10-19 ENCOUNTER — Other Ambulatory Visit: Payer: Self-pay

## 2020-10-19 DIAGNOSIS — D124 Benign neoplasm of descending colon: Secondary | ICD-10-CM | POA: Diagnosis not present

## 2020-10-19 DIAGNOSIS — K635 Polyp of colon: Secondary | ICD-10-CM | POA: Diagnosis not present

## 2020-10-19 DIAGNOSIS — K648 Other hemorrhoids: Secondary | ICD-10-CM | POA: Insufficient documentation

## 2020-10-19 DIAGNOSIS — Z8249 Family history of ischemic heart disease and other diseases of the circulatory system: Secondary | ICD-10-CM | POA: Diagnosis not present

## 2020-10-19 DIAGNOSIS — K921 Melena: Secondary | ICD-10-CM | POA: Insufficient documentation

## 2020-10-19 DIAGNOSIS — Z79899 Other long term (current) drug therapy: Secondary | ICD-10-CM | POA: Diagnosis not present

## 2020-10-19 DIAGNOSIS — Z833 Family history of diabetes mellitus: Secondary | ICD-10-CM | POA: Diagnosis not present

## 2020-10-19 DIAGNOSIS — I10 Essential (primary) hypertension: Secondary | ICD-10-CM | POA: Insufficient documentation

## 2020-10-19 DIAGNOSIS — D125 Benign neoplasm of sigmoid colon: Secondary | ICD-10-CM | POA: Diagnosis not present

## 2020-10-19 DIAGNOSIS — R7303 Prediabetes: Secondary | ICD-10-CM | POA: Insufficient documentation

## 2020-10-19 DIAGNOSIS — D123 Benign neoplasm of transverse colon: Secondary | ICD-10-CM | POA: Insufficient documentation

## 2020-10-19 DIAGNOSIS — K5730 Diverticulosis of large intestine without perforation or abscess without bleeding: Secondary | ICD-10-CM | POA: Insufficient documentation

## 2020-10-19 HISTORY — PX: POLYPECTOMY: SHX5525

## 2020-10-19 HISTORY — PX: BIOPSY: SHX5522

## 2020-10-19 HISTORY — PX: COLONOSCOPY WITH PROPOFOL: SHX5780

## 2020-10-19 SURGERY — COLONOSCOPY WITH PROPOFOL
Anesthesia: General

## 2020-10-19 MED ORDER — PROPOFOL 500 MG/50ML IV EMUL
INTRAVENOUS | Status: DC | PRN
  Administered 2020-10-19: 150 ug/kg/min via INTRAVENOUS

## 2020-10-19 MED ORDER — PROPOFOL 10 MG/ML IV BOLUS
INTRAVENOUS | Status: DC | PRN
  Administered 2020-10-19: 30 mg via INTRAVENOUS
  Administered 2020-10-19: 40 mg via INTRAVENOUS
  Administered 2020-10-19: 100 mg via INTRAVENOUS
  Administered 2020-10-19: 50 mg via INTRAVENOUS
  Administered 2020-10-19: 30 mg via INTRAVENOUS

## 2020-10-19 MED ORDER — LACTATED RINGERS IV SOLN: INTRAVENOUS | Status: DC

## 2020-10-19 NOTE — H&P (Signed)
Primary Care Physician:  Kathyrn Drown, MD Primary Gastroenterologist:  Dr. Abbey Chatters  Pre-Procedure History & Physical: HPI:  Patricia Fuller is a 70 y.o. female is here for a colonoscopy to be performed for heme positive stool. No prior colonoscopy. Patient denies any family history of colorectal cancer.  No melena or hematochezia.  No abdominal pain or unintentional weight loss.  No change in bowel habits.  Overall feels well from a GI standpoint.  Past Medical History:  Diagnosis Date  . Hyperlipidemia   . Hypertension   . Prediabetes   . Renal calculi     Past Surgical History:  Procedure Laterality Date  . LITHOTRIPSY    . TUBAL LIGATION      Prior to Admission medications   Medication Sig Start Date End Date Taking? Authorizing Provider  acetaminophen (TYLENOL) 500 MG tablet Take 500 mg by mouth every 6 (six) hours as needed for mild pain or moderate pain.   Yes [provider]  cetirizine (ZYRTEC) 10 MG tablet Take 10 mg by mouth daily as needed for allergies.   Yes [provider]  losartan (COZAAR) 50 MG tablet Take 1 tablet (50 mg total) by mouth every morning. 06/19/20  Yes Kathyrn Drown, MD  rosuvastatin (CRESTOR) 10 MG tablet Take 1 tablet (10 mg total) by mouth daily. 06/19/20  Yes Luking, Elayne Snare, MD  vitamin B-12 (CYANOCOBALAMIN) 1000 MCG tablet Take 1,000 mcg by mouth 3 (three) times a week.   Yes [provider]  vitamin C (ASCORBIC ACID) 500 MG tablet Take 500 mg by mouth 3 (three) times a week.   Yes [provider]  zinc gluconate 50 MG tablet Take 50 mg by mouth 3 (three) times a week.   Yes [provider]    Allergies as of 09/09/2020  . (No Known Allergies)    Family History  Problem Relation Age of Onset  . Hypertension Mother   . Diabetes Mother   . Heart disease Mother   . Colon cancer Neg Hx     Social History   Socioeconomic History  . Marital status: Single    Spouse name: Not on file  . Number of  children: Not on file  . Years of education: Not on file  . Highest education level: Not on file  Occupational History  . Not on file  Tobacco Use  . Smoking status: Never Smoker  . Smokeless tobacco: Never Used  Vaping Use  . Vaping Use: Never used  Substance and Sexual Activity  . Alcohol use: No  . Drug use: No  . Sexual activity: Never  Other Topics Concern  . Not on file  Social History Narrative  . Not on file   Social Determinants of Health   Financial Resource Strain: Not on file  Food Insecurity: Not on file  Transportation Needs: Not on file  Physical Activity: Not on file  Stress: Not on file  Social Connections: Not on file  Intimate Partner Violence: Not on file    Review of Systems: See HPI, otherwise negative ROS  Physical Exam: Vital signs in last 24 hours: Temp:  [98 F (36.7 C)] 98 F (36.7 C) (05/16 0740) Resp:  [15] 15 (05/16 0740) BP: (163)/(84) 163/84 (05/16 0740) SpO2:  [95 %] 95 % (05/16 0740) Weight:  [104.3 kg] 104.3 kg (05/16 0740)   General:   Alert,  Well-developed, well-nourished, pleasant and cooperative in NAD Head:  Normocephalic and atraumatic. Eyes:  Sclera  clear, no icterus.   Conjunctiva pink. Ears:  Normal auditory acuity. Nose:  No deformity, discharge,  or lesions. Mouth:  No deformity or lesions, dentition normal. Neck:  Supple; no masses or thyromegaly. Lungs:  Clear throughout to auscultation.   No wheezes, crackles, or rhonchi. No acute distress. Heart:  Regular rate and rhythm; no murmurs, clicks, rubs,  or gallops. Abdomen:  Soft, nontender and nondistended. No masses, hepatosplenomegaly or hernias noted. Normal bowel sounds, without guarding, and without rebound.   Msk:  Symmetrical without gross deformities. Normal posture. Extremities:  Without clubbing or edema. Neurologic:  Alert and  oriented x4;  grossly normal neurologically. Skin:  Intact without significant lesions or rashes. Cervical Nodes:  No  significant cervical adenopathy. Psych:  Alert and cooperative. Normal mood and affect.  Impression/Plan: [name] is here for a colonoscopy to be performed for heme positive stool.  The risks of the procedure including infection, bleed, or perforation as well as benefits, limitations, alternatives and imponderables have been reviewed with the patient. Questions have been answered. All parties agreeable.

## 2020-10-19 NOTE — Op Note (Signed)
[facility] Patient Name: Patricia Fuller Procedure Date: 10/19/2020 8:55 AM MRN: [id number] Date of Birth: 1950-10-15 Attending MD: Elon Alas. Abbey Chatters DO CSN: [id number] Age: 70 Admit Type: Outpatient Procedure:                Colonoscopy Indications:              Heme positive stool Providers:                Elon Alas. Abbey Chatters, DO, Janeece Riggers, RN, Kristine L.                            Risa Grill, Technician Referring MD:              Medicines:                 Complications:            No immediate complications. Estimated Blood Loss:     Estimated blood loss was minimal. Procedure:                Pre-Anesthesia Assessment:                           - The anesthesia plan was to use monitored                            anesthesia care (MAC).                           After obtaining informed consent, the colonoscope                            was passed under direct vision. Throughout the                            procedure, the patient's blood pressure, pulse, and                            oxygen saturations were monitored continuously. The                            [id number]-H190DL ([id number]) scope was introduced through                            the anus and advanced to the the cecum, identified                            by appendiceal orifice and ileocecal valve. The                            colonoscopy was performed without difficulty. The                            patient tolerated the procedure well. The quality                            of the bowel preparation was evaluated using the  BBPS [facility] Bowel Preparation Scale) with scores                            of: Right Colon = 2 (minor amount of residual                            staining, small fragments of stool and/or opaque                            liquid, but mucosa seen well), Transverse Colon = 2                            (minor amount of residual staining, small fragments                             of stool and/or opaque liquid, but mucosa seen                            well) and Left Colon = 2 (minor amount of residual                            staining, small fragments of stool and/or opaque                            liquid, but mucosa seen well). The total BBPS score                            equals 6. The quality of the bowel preparation was                            fair. Scope In: 9:08:02 AM Scope Out: 9:39:56 AM Scope Withdrawal Time: 0 hours 25 minutes 26 seconds  Total Procedure Duration: 0 hours 31 minutes 54 seconds  Findings:      The perianal and digital rectal examinations were normal.      Non-bleeding internal hemorrhoids were found during endoscopy.      Multiple small and large-mouthed diverticula were found in the sigmoid       colon.      Three sessile polyps were found in the transverse colon. The polyps were       4 to 6 mm in size. These polyps were removed with a cold snare.       Resection and retrieval were complete.      A 2 mm polyp was found in the transverse colon. The polyp was sessile.       The polyp was removed with a cold biopsy forceps. Resection and       retrieval were complete.      A 18 mm polyp was found in the descending colon. The polyp was       pedunculated. The polyp was removed with a hot snare. Resection and       retrieval were complete.      A 10 mm polyp was found in the sigmoid colon. The polyp was       semi-pedunculated. The polyp was removed with a hot snare.  Resection and       retrieval were complete. Impression:               - Preparation of the colon was fair.                           - Non-bleeding internal hemorrhoids.                           - Diverticulosis in the sigmoid colon.                           - Three 4 to 6 mm polyps in the transverse colon,                            removed with a cold snare. Resected and retrieved.                           - One 2 mm polyp in the transverse  colon, removed                            with a cold biopsy forceps. Resected and retrieved.                           - One 18 mm polyp in the descending colon, removed                            with a hot snare. Resected and retrieved.                           - One 10 mm polyp in the sigmoid colon, removed                            with a hot snare. Resected and retrieved. Moderate Sedation:      Per Anesthesia Care Recommendation:           - Patient has a contact number available for                            emergencies. The signs and symptoms of potential                            delayed complications were discussed with the                            patient. Return to normal activities tomorrow.                            Written discharge instructions were provided to the                            patient.                           - Resume previous diet.                           -  Continue present medications.                           - Await pathology results.                           - Repeat colonoscopy 1-3 years for surveillance.                           - Return to GI clinic PRN. Procedure Code(s):        --- Professional ---                           [phone], Colonoscopy, flexible; with removal of                            tumor(s), polyp(s), or other lesion(s) by snare                            technique                           [id number], 81, Colonoscopy, flexible; with biopsy,                            single or multiple Diagnosis Code(s):        --- Professional ---                           K64.8, Other hemorrhoids                           K63.5, Polyp of colon                           R19.5, Other fecal abnormalities                           K57.30, Diverticulosis of large intestine without                            perforation or abscess without bleeding CPT copyright 2019 American [employer] Association. All rights reserved. The codes documented in  this report are preliminary and upon coder review may  be revised to meet current compliance requirements. Elon Alas. Abbey Chatters, DO [place] Abbey Chatters, DO 10/19/2020 9:43:47 AM This report has been signed electronically. Number of Addenda: 0

## 2020-10-19 NOTE — Anesthesia Preprocedure Evaluation (Signed)
Anesthesia Evaluation  Patient identified by MRN, date of birth, ID band Patient awake    Reviewed: Allergy & Precautions, H&P , NPO status , Patient's Chart, lab work & pertinent test results, reviewed documented beta blocker date and time   Airway Mallampati: II  TM Distance: >3 FB Neck ROM: full    Dental no notable dental hx.    Pulmonary neg pulmonary ROS,    Pulmonary exam normal breath sounds clear to auscultation       Cardiovascular Exercise Tolerance: Good hypertension, negative cardio ROS   Rhythm:regular Rate:Normal     Neuro/Psych  Neuromuscular disease negative psych ROS   GI/Hepatic negative GI ROS, Neg liver ROS,   Endo/Other  Morbid obesity  Renal/GU Renal disease  negative genitourinary   Musculoskeletal   Abdominal   Peds  Hematology negative hematology ROS (+)   Anesthesia Other Findings   Reproductive/Obstetrics negative OB ROS                             Anesthesia Physical Anesthesia Plan  ASA: II  Anesthesia Plan: General   Post-op Pain Management:    Induction:   PONV Risk Score and Plan: Propofol infusion  Airway Management Planned:   Additional Equipment:   Intra-op Plan:   Post-operative Plan:   Informed Consent: I have reviewed the patients History and Physical, chart, labs and discussed the procedure including the risks, benefits and alternatives for the proposed anesthesia with the patient or authorized representative who has indicated his/her understanding and acceptance.     Dental Advisory Given  Plan Discussed with: CRNA  Anesthesia Plan Comments:         Anesthesia Quick Evaluation

## 2020-10-19 NOTE — Anesthesia Postprocedure Evaluation (Signed)
Anesthesia Post Note  Patient: Patricia Fuller  Procedure(s) Performed: COLONOSCOPY WITH PROPOFOL (N/A ) BIOPSY POLYPECTOMY  Patient location during evaluation: Phase II Anesthesia Type: General Level of consciousness: awake Pain management: pain level controlled Vital Signs Assessment: post-procedure vital signs reviewed and stable Respiratory status: spontaneous breathing and respiratory function stable Cardiovascular status: blood pressure returned to baseline and stable Postop Assessment: no headache and no apparent nausea or vomiting Anesthetic complications: no Comments: Late entry   No complications documented.   Last Vitals:  Vitals:   10/19/20 0740 10/19/20 0945  BP: (!) 163/84 (!) 115/57  Resp: 15 19  Temp: 36.7 C 36.5 C  SpO2: 95% 95%    Last Pain:  Vitals:   10/19/20 0945  TempSrc: Oral  PainSc: 0-No pain                 Louann Sjogren

## 2020-10-19 NOTE — Discharge Instructions (Addendum)
Colonoscopy Discharge Instructions  Read the instructions outlined below and refer to this sheet in the next few weeks. These discharge instructions provide you with general information on caring for yourself after you leave the hospital. Your doctor may also give you specific instructions. While your treatment has been planned according to the most current medical practices available, unavoidable complications occasionally occur.   ACTIVITY  You may resume your regular activity, but move at a slower pace for the next 24 hours.   Take frequent rest periods for the next 24 hours.   Walking will help get rid of the air and reduce the bloated feeling in your belly (abdomen).   No driving for 24 hours (because of the medicine (anesthesia) used during the test).    Do not sign any important legal documents or operate any machinery for 24 hours (because of the anesthesia used during the test).  NUTRITION  Drink plenty of fluids.   You may resume your normal diet as instructed by your doctor.   Begin with a light meal and progress to your normal diet. Heavy or fried foods are harder to digest and may make you feel sick to your stomach (nauseated).   Avoid alcoholic beverages for 24 hours or as instructed.  MEDICATIONS  You may resume your normal medications unless your doctor tells you otherwise.  WHAT YOU CAN EXPECT TODAY  Some feelings of bloating in the abdomen.   Passage of more gas than usual.   Spotting of blood in your stool or on the toilet paper.  IF YOU HAD POLYPS REMOVED DURING THE COLONOSCOPY:  No aspirin products for 7 days or as instructed.   No alcohol for 7 days or as instructed.   Eat a soft diet for the next 24 hours.  FINDING OUT THE RESULTS OF YOUR TEST Not all test results are available during your visit. If your test results are not back during the visit, make an appointment with your caregiver to find out the results. Do not assume everything is normal if  you have not heard from your caregiver or the medical facility. It is important for you to follow up on all of your test results.  SEEK IMMEDIATE MEDICAL ATTENTION IF:  You have more than a spotting of blood in your stool.   Your belly is swollen (abdominal distention).   You are nauseated or vomiting.   You have a temperature over 101.   You have abdominal pain or discomfort that is severe or gets worse throughout the day.   Your colonoscopy revealed 6 polyps, 1 of which was quite large.  I did successfully remove all of these.  Await pathology results, my office will contact you.  Depending on pathology results I would recommend we repeat in 1 to 3 years. You also have diverticulosis and internal hemorrhoids. I would recommend increasing fiber in your diet or adding OTC Benefiber/Metamucil. Be sure to drink at least 4 to 6 glasses of water daily. Follow-up with GI as needed.   I hope you have a great rest of your week!  Elon Alas. Abbey Chatters, D.O. Gastroenterology and Hepatology [facility]  Diverticulosis  Diverticulosis is a condition that develops when small pouches (diverticula) form in the wall of the large intestine (colon). The colon is where water is absorbed and stool (feces) is formed. The pouches form when the inside layer of the colon pushes through weak spots in the outer layers of the colon. You may have a few  pouches or many of them. The pouches usually do not cause problems unless they become inflamed or infected. When this happens, the condition is called diverticulitis. What are the causes? The cause of this condition is not known. What increases the risk? The following factors may make you more likely to develop this condition:  Being older than age 50. Your risk for this condition increases with age. Diverticulosis is rare among people younger than age 31. By age 51, many people have it.  Eating a low-fiber diet.  Having frequent  constipation.  Being overweight.  Not getting enough exercise.  Smoking.  Taking over-the-counter pain medicines, like aspirin and ibuprofen.  Having a family history of diverticulosis. What are the signs or symptoms? In most people, there are no symptoms of this condition. If you do have symptoms, they may include:  Bloating.  Cramps in the abdomen.  Constipation or diarrhea.  Pain in the lower left side of the abdomen. How is this diagnosed? Because diverticulosis usually has no symptoms, it is most often diagnosed during an exam for other colon problems. The condition may be diagnosed by:  Using a flexible scope to examine the colon (colonoscopy).  Taking an X-ray of the colon after dye has been put into the colon (barium enema).  Having a CT scan. How is this treated? You may not need treatment for this condition. Your health care provider may recommend treatment to prevent problems. You may need treatment if you have symptoms or if you previously had diverticulitis. Treatment may include:  Eating a high-fiber diet.  Taking a fiber supplement.  Taking a live bacteria supplement (probiotic).  Taking medicine to relax your colon.   Follow these instructions at home: Medicines  Take over-the-counter and prescription medicines only as told by your health care provider.  If told by your health care provider, take a fiber supplement or probiotic. Constipation prevention Your condition may cause constipation. To prevent or treat constipation, you may need to:  Drink enough fluid to keep your urine pale yellow.  Take over-the-counter or prescription medicines.  Eat foods that are high in fiber, such as beans, whole grains, and fresh fruits and vegetables.  Limit foods that are high in fat and processed sugars, such as fried or sweet foods.   General instructions  Try not to strain when you have a bowel movement.  Keep all follow-up visits as told by your health  care provider. This is important. Contact a health care provider if you:  Have pain in your abdomen.  Have bloating.  Have cramps.  Have not had a bowel movement in 3 days. Get help right away if:  Your pain gets worse.  Your bloating becomes very bad.  You have a fever or chills, and your symptoms suddenly get worse.  You vomit.  You have bowel movements that are bloody or black.  You have bleeding from your rectum. Summary  Diverticulosis is a condition that develops when small pouches (diverticula) form in the wall of the large intestine (colon).  You may have a few pouches or many of them.  This condition is most often diagnosed during an exam for other colon problems.  Treatment may include increasing the fiber in your diet, taking supplements, or taking medicines. This information is not intended to replace advice given to you by your health care provider. Make sure you discuss any questions you have with your health care provider. Document Revised: 12/20/2018 Document Reviewed: 12/20/2018 Elsevier Patient Education  [place].    Hemorrhoids Hemorrhoids are swollen veins in and around the rectum or anus. There are two types of hemorrhoids:  Internal hemorrhoids. These occur in the veins that are just inside the rectum. They may poke through to the outside and become irritated and painful.  External hemorrhoids. These occur in the veins that are outside the anus and can be felt as a painful swelling or hard lump near the anus. Most hemorrhoids do not cause serious problems, and they can be managed with home treatments such as diet and lifestyle changes. If home treatments do not help the symptoms, procedures can be done to shrink or remove the hemorrhoids. What are the causes? This condition is caused by increased pressure in the anal area. This pressure may result from various things, including:  Constipation.  Straining to have a bowel  movement.  Diarrhea.  Pregnancy.  Obesity.  Sitting for long periods of time.  Heavy lifting or other activity that causes you to strain.  Anal sex.  Riding a bike for a long period of time. What are the signs or symptoms? Symptoms of this condition include:  Pain.  Anal itching or irritation.  Rectal bleeding.  Leakage of stool (feces).  Anal swelling.  One or more lumps around the anus. How is this diagnosed? This condition can often be diagnosed through a visual exam. Other exams or tests may also be done, such as:  An exam that involves feeling the rectal area with a gloved hand (digital rectal exam).  An exam of the anal canal that is done using a small tube (anoscope).  A blood test, if you have lost a significant amount of blood.  A test to look inside the colon using a flexible tube with a camera on the end (sigmoidoscopy or colonoscopy). How is this treated? This condition can usually be treated at home. However, various procedures may be done if dietary changes, lifestyle changes, and other home treatments do not help your symptoms. These procedures can help make the hemorrhoids smaller or remove them completely. Some of these procedures involve surgery, and others do not. Common procedures include:  Rubber band ligation. Rubber bands are placed at the base of the hemorrhoids to cut off their blood supply.  Sclerotherapy. Medicine is injected into the hemorrhoids to shrink them.  Infrared coagulation. A type of light energy is used to get rid of the hemorrhoids.  Hemorrhoidectomy surgery. The hemorrhoids are surgically removed, and the veins that supply them are tied off.  Stapled hemorrhoidopexy surgery. The surgeon staples the base of the hemorrhoid to the rectal wall. Follow these instructions at home: Eating and drinking  Eat foods that have a lot of fiber in them, such as whole grains, beans, nuts, fruits, and vegetables.  Ask your health care  provider about taking products that have added fiber (fiber supplements).  Reduce the amount of fat in your diet. You can do this by eating low-fat dairy products, eating less red meat, and avoiding processed foods.  Drink enough fluid to keep your urine pale yellow.   Managing pain and swelling  Take warm sitz baths for 20 minutes, 3-4 times a day to ease pain and discomfort. You may do this in a bathtub or using a portable sitz bath that fits over the toilet.  If directed, apply ice to the affected area. Using ice packs between sitz baths may be helpful. ? Put ice in a plastic bag. ? Place a towel between  your skin and the bag. ? Leave the ice on for 20 minutes, 2-3 times a day.   General instructions  Take over-the-counter and prescription medicines only as told by your health care provider.  Use medicated creams or suppositories as told.  Get regular exercise. Ask your health care provider how much and what kind of exercise is best for you. In general, you should do moderate exercise for at least 30 minutes on most days of the week (150 minutes each week). This can include activities such as walking, biking, or yoga.  Go to the bathroom when you have the urge to have a bowel movement. Do not wait.  Avoid straining to have bowel movements.  Keep the anal area dry and clean. Use wet toilet paper or moist towelettes after a bowel movement.  Do not sit on the toilet for long periods of time. This increases blood pooling and pain.  Keep all follow-up visits as told by your health care provider. This is important. Contact a health care provider if you have:  Increasing pain and swelling that are not controlled by treatment or medicine.  Difficulty having a bowel movement, or you are unable to have a bowel movement.  Pain or inflammation outside the area of the hemorrhoids. Get help right away if you have:  Uncontrolled bleeding from your rectum. Summary  Hemorrhoids are  swollen veins in and around the rectum or anus.  Most hemorrhoids can be managed with home treatments such as diet and lifestyle changes.  Taking warm sitz baths can help ease pain and discomfort.  In severe cases, procedures or surgery can be done to shrink or remove the hemorrhoids. This information is not intended to replace advice given to you by your health care provider. Make sure you discuss any questions you have with your health care provider. Document Revised: 10/19/2018 Document Reviewed: 10/12/2017 Elsevier Patient Education  [place].  Colon Polyps  Colon polyps are tissue growths inside the colon, which is part of the large intestine. They are one of the types of polyps that can grow in the body. A polyp may be a round bump or a mushroom-shaped growth. You could have one polyp or more than one. Most colon polyps are noncancerous (benign). However, some colon polyps can become cancerous over time. Finding and removing the polyps early can help prevent this. What are the causes? The exact cause of colon polyps is not known. What increases the risk? The following factors may make you more likely to develop this condition:  Having a family history of colorectal cancer or colon polyps.  Being older than 68 years of age.  Being younger than 68 years of age and having a significant family history of colorectal cancer or colon polyps or a genetic condition that puts you at higher risk of getting colon polyps.  Having inflammatory bowel disease, such as ulcerative colitis or Crohn's disease.  Having certain conditions passed from parent to child (hereditary conditions), such as: ? Familial adenomatous polyposis (FAP). ? Lynch syndrome. ? Turcot syndrome. ? Peutz-Jeghers syndrome. ? MUTYH-associated polyposis (MAP).  Being overweight.  Certain lifestyle factors. These include smoking cigarettes, drinking too much alcohol, not getting enough exercise, and eating a  diet that is high in fat and red meat and low in fiber.  Having had childhood cancer that was treated with radiation of the abdomen. What are the signs or symptoms? Many times, there are no symptoms. If you have symptoms, they may include:  Blood coming from the rectum during a bowel movement.  Blood in the stool (feces). The blood may be bright red or very dark in color.  Pain in the abdomen.  A change in bowel habits, such as constipation or diarrhea. How is this diagnosed? This condition is diagnosed with a colonoscopy. This is a procedure in which a lighted, flexible scope is inserted into the opening between the buttocks (anus) and then passed into the colon to examine the area. Polyps are sometimes found when a colonoscopy is done as part of routine cancer screening tests. How is this treated? This condition is treated by removing any polyps that are found. Most polyps can be removed during a colonoscopy. Those polyps will then be tested for cancer. Additional treatment may be needed depending on the results of testing. Follow these instructions at home: Eating and drinking  Eat foods that are high in fiber, such as fruits, vegetables, and whole grains.  Eat foods that are high in calcium and vitamin D, such as milk, cheese, yogurt, eggs, liver, fish, and broccoli.  Limit foods that are high in fat, such as fried foods and desserts.  Limit the amount of red meat, precooked or cured meat, or other processed meat that you eat, such as hot dogs, sausages, bacon, or meat loaves.  Limit sugary drinks.   Lifestyle  Maintain a healthy weight, or lose weight if recommended by your health care provider.  Exercise every day or as told by your health care provider.  Do not use any products that contain nicotine or tobacco, such as cigarettes, e-cigarettes, and chewing tobacco. If you need help quitting, ask your health care provider.  Do not drink alcohol if: ? Your health care  provider tells you not to drink. ? You are pregnant, may be pregnant, or are planning to become pregnant.  If you drink alcohol: ? Limit how much you use to:  0-1 drink a day for women.  0-2 drinks a day for men. ? Know how much alcohol is in your drink. In the [place] one drink equals one 12 oz bottle of beer (355 mL), one 5 oz glass of wine (148 mL), or one 1 oz glass of hard liquor (44 mL). General instructions  Take over-the-counter and prescription medicines only as told by your health care provider.  Keep all follow-up visits. This is important. This includes having regularly scheduled colonoscopies. Talk to your health care provider about when you need a colonoscopy. Contact a health care provider if:  You have new or worsening bleeding during a bowel movement.  You have new or increased blood in your stool.  You have a change in bowel habits.  You lose weight for no known reason. Summary  Colon polyps are tissue growths inside the colon, which is part of the large intestine. They are one type of polyp that can grow in the body.  Most colon polyps are noncancerous (benign), but some can become cancerous over time.  This condition is diagnosed with a colonoscopy.  This condition is treated by removing any polyps that are found. Most polyps can be removed during a colonoscopy. This information is not intended to replace advice given to you by your health care provider. Make sure you discuss any questions you have with your health care provider. Document Revised: 09/11/2019 Document Reviewed: 09/11/2019 Elsevier Patient Education  2021 [employer].

## 2020-10-19 NOTE — Transfer of Care (Signed)
Immediate Anesthesia Transfer of Care Note  Patient: Patricia Fuller  Procedure(s) Performed: COLONOSCOPY WITH PROPOFOL (N/A ) BIOPSY POLYPECTOMY  Patient Location: PACU  Anesthesia Type:General  Level of Consciousness: awake and alert   Airway & Oxygen Therapy: Patient Spontanous Breathing  Post-op Assessment: Report given to RN and Post -op Vital signs reviewed and stable  Post vital signs: Reviewed and stable  Last Vitals:  Vitals Value Taken Time  BP    Temp    Pulse    Resp    SpO2      Last Pain:  Vitals:   10/19/20 0740  TempSrc: Oral  PainSc: 0-No pain      Patients Stated Pain Goal: 8 (24/40/10 2725)  Complications: No complications documented.

## 2020-10-21 LAB — SURGICAL PATHOLOGY

## 2020-10-23 ENCOUNTER — Encounter (HOSPITAL_COMMUNITY): Payer: Self-pay | Admitting: Internal Medicine

## 2020-11-13 NOTE — Progress Notes (Signed)
Recall placed

## 2020-12-11 ENCOUNTER — Telehealth: Payer: Self-pay | Admitting: Family Medicine

## 2020-12-11 NOTE — Telephone Encounter (Signed)
Pt is having lower back pain on the right side. Pt would like an earlier app. If possible just needing to know where to put on the schedule   Pt would like a call back

## 2020-12-11 NOTE — Telephone Encounter (Signed)
Patient has been advised to seek medical attention via [facility] visits and urgent care visits.

## 2020-12-13 ENCOUNTER — Other Ambulatory Visit: Payer: Self-pay

## 2020-12-13 ENCOUNTER — Encounter: Payer: Self-pay | Admitting: Emergency Medicine

## 2020-12-13 ENCOUNTER — Ambulatory Visit
Admission: EM | Admit: 2020-12-13 | Discharge: 2020-12-13 | Disposition: A | Discharge status: Home / Self Care | Payer: PPO | Attending: Urgent Care | Admitting: Urgent Care

## 2020-12-13 ENCOUNTER — Ambulatory Visit (INDEPENDENT_AMBULATORY_CARE_PROVIDER_SITE_OTHER): Payer: PPO

## 2020-12-13 DIAGNOSIS — R109 Unspecified abdominal pain: Secondary | ICD-10-CM

## 2020-12-13 DIAGNOSIS — Z87442 Personal history of urinary calculi: Secondary | ICD-10-CM

## 2020-12-13 DIAGNOSIS — M25551 Pain in right hip: Secondary | ICD-10-CM

## 2020-12-13 DIAGNOSIS — R10A1 Flank pain, right side: Secondary | ICD-10-CM

## 2020-12-13 DIAGNOSIS — M1611 Unilateral primary osteoarthritis, right hip: Secondary | ICD-10-CM

## 2020-12-13 DIAGNOSIS — E119 Type 2 diabetes mellitus without complications: Secondary | ICD-10-CM

## 2020-12-13 LAB — POCT URINALYSIS DIP (MANUAL ENTRY)
Bilirubin, UA: NEGATIVE
Glucose, UA: NEGATIVE mg/dL
Ketones, POC UA: NEGATIVE mg/dL
Leukocytes, UA: NEGATIVE
Nitrite, UA: NEGATIVE
Protein Ur, POC: NEGATIVE mg/dL
Spec Grav, UA: >=1.03 — AB (ref 1.010–1.025)
Urobilinogen, UA: 0.2 E.U./dL
pH, UA: 5.5 (ref 5.0–8.0)

## 2020-12-13 MED ORDER — PREDNISONE 20 MG PO TABS: ORAL_TABLET | ORAL | 0 refills | Status: DC

## 2020-12-13 NOTE — ED Triage Notes (Signed)
Right hip pain x 2 weeks.  Pain also in mid back area on right side. No known injury.

## 2020-12-13 NOTE — ED Provider Notes (Signed)
[place]   MRN: [id number] DOB: Jan 17, 1951  Subjective:   Patricia Fuller is a 70 y.o. female presenting for 3-week history of persistent right hip pain over the lateral aspect of her hip extending posteriorly.  She is also had intermittent right-sided flank pain. Has been using APAP, ibuprofen.  Denies fever, nausea, vomiting, trauma, falls, abdominal pain, dysuria, hematuria, weakness, numbness or tingling.  Patient has a history of renal calculi.  She also has a history of bilateral knee arthritis.  No current facility-administered medications for this encounter.  Current Outpatient Medications:    acetaminophen (TYLENOL) 500 MG tablet, Take 500 mg by mouth every 6 (six) hours as needed for mild pain or moderate pain., Disp: , Rfl:    cetirizine (ZYRTEC) 10 MG tablet, Take 10 mg by mouth daily as needed for allergies., Disp: , Rfl:    losartan (COZAAR) 50 MG tablet, Take 1 tablet (50 mg total) by mouth every morning., Disp: 90 tablet, Rfl: 1   rosuvastatin (CRESTOR) 10 MG tablet, Take 1 tablet (10 mg total) by mouth daily., Disp: 90 tablet, Rfl: 1   vitamin B-12 (CYANOCOBALAMIN) 1000 MCG tablet, Take 1,000 mcg by mouth 3 (three) times a week., Disp: , Rfl:    vitamin C (ASCORBIC ACID) 500 MG tablet, Take 500 mg by mouth 3 (three) times a week., Disp: , Rfl:    zinc gluconate 50 MG tablet, Take 50 mg by mouth 3 (three) times a week., Disp: , Rfl:    No Known Allergies  Past Medical History:  Diagnosis Date   Hyperlipidemia    Hypertension    Prediabetes    Renal calculi      Past Surgical History:  Procedure Laterality Date   BIOPSY  10/19/2020   Procedure: BIOPSY;  Surgeon: Eloise Harman, DO;  [facility];  Service: Endoscopy;;  transverse colon polyp   COLONOSCOPY WITH PROPOFOL N/A 10/19/2020   Procedure: COLONOSCOPY WITH PROPOFOL;  Surgeon: Eloise Harman, DO;  Location: [facility];  Service: Endoscopy;  Laterality: N/A;  am appt    LITHOTRIPSY     POLYPECTOMY  10/19/2020   Procedure: POLYPECTOMY;  Surgeon: Eloise Harman, DO;  Location: [facility];  Service: Endoscopy;;  transverse colon polyp, descending colon polyp, sigmoid colon polyp    TUBAL LIGATION      Family History  Problem Relation Age of Onset   Hypertension Mother    Diabetes Mother    Heart disease Mother    Colon cancer Neg Hx     Social History   Tobacco Use   Smoking status: Never   Smokeless tobacco: Never  Vaping Use   Vaping Use: Never used  Substance Use Topics   Alcohol use: No   Drug use: No    ROS   Objective:   Vitals: BP (!) 171/81 (BP Location: Right Arm)   Pulse 71   Temp 98.3 F (36.8 C) (Oral)   Resp 16   SpO2 95%   Physical Exam Constitutional:      General: She is not in acute distress.    Appearance: Normal appearance. She is well-developed. She is obese. She is not ill-appearing, toxic-appearing or diaphoretic.  HENT:     Head: Normocephalic and atraumatic.     Nose: Nose normal.     Mouth/Throat:     Mouth: Mucous membranes are moist.     Pharynx: Oropharynx is clear.  Eyes:  General: No scleral icterus.    Extraocular Movements: Extraocular movements intact.     Pupils: Pupils are equal, round, and reactive to light.  Cardiovascular:     Rate and Rhythm: Normal rate.  Pulmonary:     Effort: Pulmonary effort is normal.  Musculoskeletal:     Lumbar back: No swelling, edema, deformity, signs of trauma, lacerations, spasms, tenderness or bony tenderness. Normal range of motion. Negative right straight leg raise test and negative left straight leg raise test. No scoliosis.       Back:     Right hip: Tenderness (along area outlined) present. No deformity, lacerations, bony tenderness or crepitus. Normal range of motion. Normal strength.       Legs:  Skin:    General: Skin is warm and dry.  Neurological:     General: No focal deficit present.     Mental Status: She is alert and oriented to  person, place, and time.     Motor: No weakness.     Coordination: Coordination normal.     Gait: Gait normal.     Deep Tendon Reflexes: Reflexes normal.  Psychiatric:        Mood and Affect: Mood normal.        Behavior: Behavior normal.        Thought Content: Thought content normal.        Judgment: Judgment normal.    Results for orders placed or performed during the hospital encounter of 12/13/20 (from the past 24 hour(s))  POCT urinalysis dipstick     Status: Abnormal   Collection Time: 12/13/20 11:05 AM  Result Value Ref Range   Color, UA yellow yellow   Clarity, UA clear clear   Glucose, UA negative negative mg/dL   Bilirubin, UA negative negative   Ketones, POC UA negative negative mg/dL   Spec Grav, UA >=1.030 (A) 1.010 - 1.025   Blood, UA trace-intact (A) negative   pH, UA 5.5 5.0 - 8.0   Protein Ur, POC negative negative mg/dL   Urobilinogen, UA 0.2 0.2 or 1.0 E.U./dL   Nitrite, UA Negative Negative   Leukocytes, UA Negative Negative   DG Hip Unilat W or Wo Pelvis 2-3 Views Right  Result Date: 12/13/2020 CLINICAL DATA:  Right hip pain 2 weeks.  No injury. EXAM: DG HIP (WITH OR WITHOUT PELVIS) 2-3V RIGHT COMPARISON:  None. FINDINGS: No evidence of acute fracture or dislocation. Mild degenerative changes of the hips right worse than left. Mild focal cortical projection over the lateral femoral head neck junction which can be seen with impingement syndrome. Minimal degenerate change of the spine. IMPRESSION: 1. No acute findings. 2. Mild degenerative changes of the hips right worse than left. Focal cortical projection over the lateral femoral head neck junction which can be associated with impingement syndrome. Electronically Signed   By: Marin Olp M.D.   On: 12/13/2020 11:30    Assessment and Plan :   PDMP not reviewed this encounter.  1. Right hip pain   2. Right flank pain   3. History of renal calculi   4. Osteoarthritis of right hip, unspecified  osteoarthritis type   5. Type 2 diabetes mellitus treated without insulin ([place])     This patient has failed treatment with Tylenol and ibuprofen will use an oral prednisone course.  Recommended follow-up with an orthopedist.  Unfortunately I could not find any [place] and therefore gave her information to practices in [place]. Counseled patient on potential for adverse effects  with medications prescribed/recommended today, ER and return-to-clinic precautions discussed, patient verbalized understanding.    Jaynee Eagles, PA-C 12/13/20 1144

## 2020-12-15 ENCOUNTER — Other Ambulatory Visit: Payer: Self-pay | Admitting: Orthopedic Surgery

## 2020-12-15 DIAGNOSIS — M545 Low back pain, unspecified: Secondary | ICD-10-CM | POA: Diagnosis not present

## 2020-12-24 ENCOUNTER — Other Ambulatory Visit: Payer: Self-pay | Admitting: Family Medicine

## 2020-12-25 ENCOUNTER — Other Ambulatory Visit: Payer: PPO

## 2020-12-25 ENCOUNTER — Other Ambulatory Visit: Payer: Self-pay | Admitting: Family Medicine

## 2020-12-25 MED ORDER — LOSARTAN POTASSIUM 50 MG PO TABS: 50.0000 mg | ORAL_TABLET | Freq: Every morning | ORAL | 0 refills | Status: DC

## 2020-12-25 MED ORDER — ROSUVASTATIN CALCIUM 10 MG PO TABS: 10.0000 mg | ORAL_TABLET | Freq: Every day | ORAL | 0 refills | Status: DC

## 2020-12-25 NOTE — Telephone Encounter (Signed)
Patient is requesting refill on losatan 50 mg and rosuvastatin 10 mg  called into [facility].

## 2020-12-25 NOTE — Telephone Encounter (Signed)
Patient notified

## 2021-01-14 DIAGNOSIS — I10 Essential (primary) hypertension: Secondary | ICD-10-CM | POA: Diagnosis not present

## 2021-01-14 DIAGNOSIS — E7849 Other hyperlipidemia: Secondary | ICD-10-CM | POA: Diagnosis not present

## 2021-01-14 DIAGNOSIS — R7303 Prediabetes: Secondary | ICD-10-CM | POA: Diagnosis not present

## 2021-01-15 LAB — BASIC METABOLIC PANEL
BUN/Creatinine Ratio: 23 (ref 12–28)
BUN: 19 mg/dL (ref 8–27)
CO2: 23 mmol/L (ref 20–29)
Calcium: 10.2 mg/dL (ref 8.7–10.3)
Chloride: 104 mmol/L (ref 96–106)
Creatinine, Ser: 0.83 mg/dL (ref 0.57–1.00)
Glucose: 126 mg/dL — ABNORMAL HIGH (ref 65–99)
Potassium: 4.8 mmol/L (ref 3.5–5.2)
Sodium: 143 mmol/L (ref 134–144)
eGFR: 77 mL/min/{1.73_m2} (ref >59)

## 2021-01-15 LAB — LIPID PANEL
Chol/HDL Ratio: 2.6 ratio (ref 0.0–4.4)
Cholesterol, Total: 144 mg/dL (ref 100–199)
HDL: 56 mg/dL (ref >39)
LDL Chol Calc (NIH): 60 mg/dL (ref 0–99)
Triglycerides: 165 mg/dL — ABNORMAL HIGH (ref 0–149)
VLDL Cholesterol Cal: 28 mg/dL (ref 5–40)

## 2021-01-15 LAB — HEMOGLOBIN A1C
Est. average glucose Bld gHb Est-mCnc: 143 mg/dL
Hgb A1c MFr Bld: 6.6 % — ABNORMAL HIGH (ref 4.8–5.6)

## 2021-01-18 ENCOUNTER — Ambulatory Visit (INDEPENDENT_AMBULATORY_CARE_PROVIDER_SITE_OTHER): Payer: PPO | Admitting: Family Medicine

## 2021-01-18 ENCOUNTER — Other Ambulatory Visit: Payer: Self-pay

## 2021-01-18 VITALS — BP 136/86 | HR 69 | Ht 66.0 in | Wt 230.8 lb

## 2021-01-18 DIAGNOSIS — R7303 Prediabetes: Secondary | ICD-10-CM

## 2021-01-18 DIAGNOSIS — I10 Essential (primary) hypertension: Secondary | ICD-10-CM | POA: Diagnosis not present

## 2021-01-18 DIAGNOSIS — E785 Hyperlipidemia, unspecified: Secondary | ICD-10-CM | POA: Diagnosis not present

## 2021-01-18 DIAGNOSIS — E1169 Type 2 diabetes mellitus with other specified complication: Secondary | ICD-10-CM | POA: Diagnosis not present

## 2021-01-18 MED ORDER — LOSARTAN POTASSIUM 50 MG PO TABS: 50.0000 mg | ORAL_TABLET | Freq: Every morning | ORAL | 1 refills | Status: DC

## 2021-01-18 MED ORDER — METFORMIN HCL 500 MG PO TABS: 250.0000 mg | ORAL_TABLET | Freq: Two times a day (BID) | ORAL | 1 refills | Status: DC

## 2021-01-18 MED ORDER — ROSUVASTATIN CALCIUM 10 MG PO TABS: 10.0000 mg | ORAL_TABLET | Freq: Every day | ORAL | 1 refills | Status: DC

## 2021-01-18 NOTE — Progress Notes (Signed)
   Subjective:    Patient ID: Patricia Fuller, female    DOB: November 08, 1950, 71 y.o.   MRN: [id number]  Hypertension This is a chronic problem. The current episode started more than 1 year ago. Risk factors for coronary artery disease include dyslipidemia. Treatments tried: cozaar. There are no compliance problems.    She states she is trying to watch her diet but she admits that she could have done better she is trying to stay a little bit active but does not get out during the hot weather.    Her labs was reviewed A1c 6.6, kidney function good   Objective:   Physical Exam  General-in no acute distress Eyes-no discharge Lungs-respiratory rate normal, CTA CV-no murmurs,RRR Extremities skin warm dry no edema Neuro grossly normal Behavior normal, alert       Assessment & Plan:   Type 2 diabetes New onset.  A1c now 6.6 was 6.5 patient needs to get started on medication.  We did discuss dietary measures.  Patient defers on having glucometer.  We did discuss benefits of metformin as well as side effects.  We will go ahead with half tablet twice daily.  Recheck patient in 3 to 4 months check A1c before that visit along with kidney function  Current lab work looks good  Cholesterol profile good control continue current medication  Blood pressure reasonable control continue current medicine

## 2021-01-18 NOTE — Patient Instructions (Addendum)
Results for orders placed or performed during the hospital encounter of 12/13/20  POCT urinalysis dipstick  Result Value Ref Range   Color, UA yellow yellow   Clarity, UA clear clear   Glucose, UA negative negative mg/dL   Bilirubin, UA negative negative   Ketones, POC UA negative negative mg/dL   Spec Grav, UA >=1.030 (A) 1.010 - 1.025   Blood, UA trace-intact (A) negative   pH, UA 5.5 5.0 - 8.0   Protein Ur, POC negative negative mg/dL   Urobilinogen, UA 0.2 0.2 or 1.0 E.U./dL   Nitrite, UA Negative Negative   Leukocytes, UA Negative Negative     Diabetes Mellitus and Nutrition, Adult When you have diabetes, or diabetes mellitus, it is very important to have healthy eating habits because your blood sugar (glucose) levels are greatly affected by what you eat and drink. Eating healthy foods in the right amounts, at about the same times every day, can help you: Control your blood glucose. Lower your risk of heart disease. Improve your blood pressure. Reach or maintain a healthy weight. What can affect my meal plan? Every person with diabetes is different, and each person has different needs for a meal plan. Your health care provider may recommend that you work with a dietitian to make a meal plan that is best for you. Your meal plan may vary depending on factors such as: The calories you need. The medicines you take. Your weight. Your blood glucose, blood pressure, and cholesterol levels. Your activity level. Other health conditions you have, such as heart or kidney disease. How do carbohydrates affect me? Carbohydrates, also called carbs, affect your blood glucose level more than any other type of food. Eating carbs naturally raises the amount of glucose in your blood. Carb counting is a method for keeping track of how many carbs you eat. Counting carbs is important to keep your blood glucose at a healthy level,especially if you use insulin or take certain oral diabetes  medicines. It is important to know how many carbs you can safely have in each meal. This is different for every person. Your dietitian can help you calculate how manycarbs you should have at each meal and for each snack. How does alcohol affect me? Alcohol can cause a sudden decrease in blood glucose (hypoglycemia), especially if you use insulin or take certain oral diabetes medicines. Hypoglycemia can be a life-threatening condition. Symptoms of hypoglycemia, such as sleepiness, dizziness, and confusion, are similar to symptoms of having too much alcohol. Do not drink alcohol if: Your health care provider tells you not to drink. You are pregnant, may be pregnant, or are planning to become pregnant. If you drink alcohol: Do not drink on an empty stomach. Limit how much you use to: 0-1 drink a day for women. 0-2 drinks a day for men. Be aware of how much alcohol is in your drink. In the [place] one drink equals one 12 oz bottle of beer (355 mL), one 5 oz glass of wine (148 mL), or one 1 oz glass of hard liquor (44 mL). Keep yourself hydrated with water, diet soda, or unsweetened iced tea. Keep in mind that regular soda, juice, and other mixers may contain a lot of sugar and must be counted as carbs. What are tips for following this plan?  Reading food labels Start by checking the serving size on the "Nutrition Facts" label of packaged foods and drinks. The amount of calories, carbs, fats, and other nutrients listed on the label  is based on one serving of the item. Many items contain more than one serving per package. Check the total grams (g) of carbs in one serving. You can calculate the number of servings of carbs in one serving by dividing the total carbs by 15. For example, if a food has 30 g of total carbs per serving, it would be equal to 2 servings of carbs. Check the number of grams (g) of saturated fats and trans fats in one serving. Choose foods that have a low amount or none of these  fats. Check the number of milligrams (mg) of salt (sodium) in one serving. Most people should limit total sodium intake to less than 2,300 mg per day. Always check the nutrition information of foods labeled as "low-fat" or "nonfat." These foods may be higher in added sugar or refined carbs and should be avoided. Talk to your dietitian to identify your daily goals for nutrients listed on the label. Shopping Avoid buying canned, pre-made, or processed foods. These foods tend to be high in fat, sodium, and added sugar. Shop around the outside edge of the grocery store. This is where you will most often find fresh fruits and vegetables, bulk grains, fresh meats, and fresh dairy. Cooking Use low-heat cooking methods, such as baking, instead of high-heat cooking methods like deep frying. Cook using healthy oils, such as olive, canola, or sunflower oil. Avoid cooking with butter, cream, or high-fat meats. Meal planning Eat meals and snacks regularly, preferably at the same times every day. Avoid going long periods of time without eating. Eat foods that are high in fiber, such as fresh fruits, vegetables, beans, and whole grains. Talk with your dietitian about how many servings of carbs you can eat at each meal. Eat 4-6 oz (112-168 g) of lean protein each day, such as lean meat, chicken, fish, eggs, or tofu. One ounce (oz) of lean protein is equal to: 1 oz (28 g) of meat, chicken, or fish. 1 egg.  cup (62 g) of tofu. Eat some foods each day that contain healthy fats, such as avocado, nuts, seeds, and fish. What foods should I eat? Fruits Berries. Apples. Oranges. Peaches. Apricots. Plums. Grapes. Mango. Papaya.Pomegranate. Kiwi. Cherries. Vegetables Lettuce. Spinach. Leafy greens, including kale, chard, collard greens, and mustard greens. Beets. Cauliflower. Cabbage. Broccoli. Carrots. Green beans.Tomatoes. Peppers. Onions. Cucumbers. Brussels sprouts. Grains Whole grains, such as whole-wheat or  whole-grain bread, crackers, tortillas,cereal, and pasta. Unsweetened oatmeal. Quinoa. Brown or wild rice. Meats and other proteins Seafood. Poultry without skin. Lean cuts of poultry and beef. Tofu. Nuts. Seeds. Dairy Low-fat or fat-free dairy products such as milk, yogurt, and cheese. The items listed above may not be a complete list of foods and beverages you can eat. Contact a dietitian for more information. What foods should I avoid? Fruits Fruits canned with syrup. Vegetables Canned vegetables. Frozen vegetables with butter or cream sauce. Grains Refined white flour and flour products such as bread, pasta, snack foods, andcereals. Avoid all processed foods. Meats and other proteins Fatty cuts of meat. Poultry with skin. Breaded or fried meats. Processed meat.Avoid saturated fats. Dairy Full-fat yogurt, cheese, or milk. Beverages Sweetened drinks, such as soda or iced tea. The items listed above may not be a complete list of foods and beverages you should avoid. Contact a dietitian for more information. Questions to ask a health care provider Do I need to meet with a diabetes educator? Do I need to meet with a dietitian? What number can I  call if I have questions? When are the best times to check my blood glucose? Where to find more information: American Diabetes Association: diabetes.org Academy of Nutrition and Dietetics: www.eatright.[employer] of Diabetes and Digestive and Kidney Diseases: [employer] Association of Diabetes Care and Education Specialists: www.diabeteseducator.org Summary It is important to have healthy eating habits because your blood sugar (glucose) levels are greatly affected by what you eat and drink. A healthy meal plan will help you control your blood glucose and maintain a healthy lifestyle. Your health care provider may recommend that you work with a dietitian to make a meal plan that is best for you. Keep in mind that  carbohydrates (carbs) and alcohol have immediate effects on your blood glucose levels. It is important to count carbs and to use alcohol carefully. This information is not intended to replace advice given to you by your health care provider. Make sure you discuss any questions you have with your healthcare provider. Document Revised: 04/30/2019 Document Reviewed: 04/30/2019 Elsevier Patient Education  2021 [place]. Metformin Tablets What is this medication? METFORMIN (met FOR min) treats type 2 diabetes. It controls blood sugar (glucose) and helps your body use insulin effectively. This medication is oftencombined with changes to diet and exercise. This medicine may be used for other purposes; ask your health care provider orpharmacist if you have questions. COMMON BRAND NAME(S): Glucophage What should I tell my care team before I take this medication? They need to know if you have any of these conditions: Anemia Dehydration Heart disease If you often drink alcohol Kidney disease Liver disease Polycystic ovary syndrome Serious infection or injury Vomiting An unusual or allergic reaction to metformin, other medications, foods, dyes, or preservatives Pregnant or trying to get pregnant Breast-feeding How should I use this medication? Take this medication by mouth with a glass of water. Follow the directions on the prescription label. Take this medication with food. Take your medication at regular intervals. Do not take your medication more often than directed. Do notstop taking except on your care team's advice. Talk to your care team about the use of this medication in children. While this medication may be prescribed for children as young as 42 years of age forselected conditions, precautions do apply. Overdosage: If you think you have taken too much of this medicine contact apoison control center or emergency room at once. NOTE: This medicine is only for you. Do not share this  medicine with others. What if I miss a dose? If you miss a dose, take it as soon as you can. If it is almost time for yournext dose, take only that dose. Do not take double or extra doses. What may interact with this medication? Do not take this medication with any of the following: Certain contrast medications given before X-rays, CT scans, MRI, or other procedures Dofetilide This medication may also interact with the following: Acetazolamide Alcohol Certain antivirals for HIV or hepatitis Certain medications for blood pressure, heart disease, irregular heart beat Cimetidine Dichlorphenamide Digoxin Diuretics Estrogens, progestins, or birth control pills Glycopyrrolate Isoniazid Lamotrigine Memantine Methazolamide Metoclopramide Midodrine Niacin Phenothiazines like chlorpromazine, mesoridazine, prochlorperazine, thioridazine Phenytoin Ranolazine Steroid medications like prednisone or cortisone Stimulant medications for attention disorders, weight loss, or to stay awake Thyroid medications Topiramate Trospium Vandetanib Zonisamide This list may not describe all possible interactions. Give your health care provider a list of all the medicines, herbs, non-prescription drugs, or dietary supplements you use. Also tell them if you smoke, drink alcohol, or use  illegaldrugs. Some items may interact with your medicine. What should I watch for while using this medication? Visit your care team for regular checks on your progress. A test called the HbA1C (A1C) will be monitored. This is a simple blood test. It measures your blood sugar control over the last 2 to 3 months. You willreceive this test every 3 to 6 months. Using this medication with insulin or a sulfonylurea may increase your risk of hypoglycemia. Learn how to check your blood sugar. Learn the symptoms of lowand high blood sugar and how to manage them. Always carry a quick-source of sugar with you in case you have symptoms of  low blood sugar. Examples include hard sugar candy or glucose tablets. Make sure others know that you can choke if you eat or drink when you develop serious symptoms of low blood sugar, such as seizures or unconsciousness. They must getmedical help at once. Tell your care team if you have high blood sugar. You might need to change the dose of your medication. If you are sick or exercising more than usual, youmight need to change the dose of your medication. Do not skip meals. Ask your care team if you should avoid alcohol. Many nonprescription cough and cold products contain sugar or alcohol. These canaffect blood sugar. This medication may cause ovulation in premenopausal women who do not have regular monthly periods. This may increase your chances of becoming pregnant. You should not take this medication if you become pregnant or think you may be pregnant. Talk with your care team about your birth control options while taking this medication. Contact your care team right away if you think you arepregnant. If you are going to need surgery, an MRI, CT scan, or other procedure, tell your care team that you are taking this medication. You may need to stop takingthis medication before the procedure. Wear a medical ID bracelet or chain, and carry a card that describes yourdisease and details of your medication and dosage times. This medication may cause a decrease in folic acid and vitamin B12. You should make sure that you get enough vitamins while you are taking this medication.Discuss the foods you eat and the vitamins you take with your care team. What side effects may I notice from receiving this medication? Side effects that you should report to your care team as soon as possible: Allergic reactions-skin rash, itching, hives, swelling of the face, lips, tongue, or throat High lactic acid level-muscle pain or cramps, stomach pain, trouble breathing, general discomfort or fatigue Low vitamin B12  level-pain, tingling, or numbness in the hands or feet, muscle weakness, dizziness, confusion, difficulty concentrating Side effects that usually do not require medical attention (report to your careteam if they continue or are bothersome): Diarrhea Gas Headache Metallic taste in mouth Nausea This list may not describe all possible side effects. Call your doctor for medical advice about side effects. You may report side effects to FDA at1-800-FDA-1088. Where should I keep my medication? Keep out of the reach of children and pets. Store at room temperature between 15 and 30 degrees C (59 and 86 degrees F). Protect from moisture and light. Get rid of any unused medication after theexpiration date. To get rid of medications that are no longer needed or expired: Take the medication to a medication take-back program. Check with your pharmacy or law enforcement to find a location. If you cannot return the medication, check the label or package insert to see if the medication should be  thrown out in the garbage or flushed down the toilet. If you are not sure, ask your care team. If it is safe to put in the trash, empty the medication out of the container. Mix the medication with cat litter, dirt, coffee grounds, or other unwanted substance. Seal the mixture in a bag or container. Put it in the trash. NOTE: This sheet is a summary. It may not cover all possible information. If you have questions about this medicine, talk to your doctor, pharmacist, orhealth care provider.  2022 Elsevier/Gold Standard (2020-04-19 10:29:07)

## 2021-05-20 ENCOUNTER — Ambulatory Visit: Payer: PPO | Admitting: Family Medicine

## 2021-06-08 DIAGNOSIS — I10 Essential (primary) hypertension: Secondary | ICD-10-CM | POA: Diagnosis not present

## 2021-06-08 DIAGNOSIS — R7303 Prediabetes: Secondary | ICD-10-CM | POA: Diagnosis not present

## 2021-06-09 LAB — BASIC METABOLIC PANEL
BUN/Creatinine Ratio: 24 (ref 12–28)
BUN: 20 mg/dL (ref 8–27)
CO2: 23 mmol/L (ref 20–29)
Calcium: 10 mg/dL (ref 8.7–10.3)
Chloride: 103 mmol/L (ref 96–106)
Creatinine, Ser: 0.84 mg/dL (ref 0.57–1.00)
Glucose: 114 mg/dL — ABNORMAL HIGH (ref 70–99)
Potassium: 4.6 mmol/L (ref 3.5–5.2)
Sodium: 141 mmol/L (ref 134–144)
eGFR: 76 mL/min/{1.73_m2} (ref >59)

## 2021-06-09 LAB — HEMOGLOBIN A1C
Est. average glucose Bld gHb Est-mCnc: 131 mg/dL
Hgb A1c MFr Bld: 6.2 % — ABNORMAL HIGH (ref 4.8–5.6)

## 2021-06-10 ENCOUNTER — Ambulatory Visit: Payer: PPO | Admitting: Family Medicine

## 2021-06-30 ENCOUNTER — Ambulatory Visit (INDEPENDENT_AMBULATORY_CARE_PROVIDER_SITE_OTHER): Payer: PPO | Admitting: Family Medicine

## 2021-06-30 ENCOUNTER — Other Ambulatory Visit: Payer: Self-pay

## 2021-06-30 VITALS — BP 122/80 | HR 78 | Temp 98.2°F | Ht 66.0 in | Wt 226.4 lb

## 2021-06-30 DIAGNOSIS — R739 Hyperglycemia, unspecified: Secondary | ICD-10-CM | POA: Diagnosis not present

## 2021-06-30 DIAGNOSIS — I10 Essential (primary) hypertension: Secondary | ICD-10-CM | POA: Diagnosis not present

## 2021-06-30 DIAGNOSIS — E785 Hyperlipidemia, unspecified: Secondary | ICD-10-CM | POA: Diagnosis not present

## 2021-06-30 DIAGNOSIS — E7849 Other hyperlipidemia: Secondary | ICD-10-CM

## 2021-06-30 DIAGNOSIS — E1169 Type 2 diabetes mellitus with other specified complication: Secondary | ICD-10-CM

## 2021-06-30 MED ORDER — METFORMIN HCL 500 MG PO TABS: 250.0000 mg | ORAL_TABLET | Freq: Two times a day (BID) | ORAL | 1 refills | Status: DC

## 2021-06-30 MED ORDER — LOSARTAN POTASSIUM 50 MG PO TABS: 50.0000 mg | ORAL_TABLET | Freq: Every morning | ORAL | 1 refills | Status: DC

## 2021-06-30 MED ORDER — ROSUVASTATIN CALCIUM 10 MG PO TABS: 10.0000 mg | ORAL_TABLET | Freq: Every day | ORAL | 1 refills | Status: DC

## 2021-06-30 NOTE — Patient Instructions (Signed)
It was good to see you today We sent in your medications Please do blood work and urine test before your next visit in approximately 5 to 6 months Call us if any problems TakeCare-Dr. Nicki Reaper
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
## 2021-08-03 ENCOUNTER — Other Ambulatory Visit: Payer: Self-pay

## 2021-08-03 ENCOUNTER — Ambulatory Visit (INDEPENDENT_AMBULATORY_CARE_PROVIDER_SITE_OTHER): Payer: PPO

## 2021-08-03 VITALS — Ht 66.0 in | Wt 226.0 lb

## 2021-08-03 DIAGNOSIS — Z0000 Encounter for general adult medical examination without abnormal findings: Secondary | ICD-10-CM | POA: Diagnosis not present

## 2021-08-03 NOTE — Patient Instructions (Signed)
Patricia Fuller , Thank you for taking time to come for your Medicare Wellness Visit. I appreciate your ongoing commitment to your health goals. Please review the following plan we discussed and let me know if I can assist you in the future.   Screening recommendations/referrals: Colonoscopy: Done 10/19/2020 Repeat every 3 years. Mammogram: Declined. Bone Density: Declined.  Recommended yearly ophthalmology/optometry visit for glaucoma screening and checkup Recommended yearly dental visit for hygiene and checkup  Vaccinations: Influenza vaccine: Due Repeat annually  Pneumococcal vaccine: Declined. Tdap vaccine: Due. Repeat in 10 years  Shingles vaccine: Declined.   Covid-19:Declined.  Advanced directives: Advance directive discussed with you today. Even though you declined this today, please call our office should you change your mind, and we can give you the proper paperwork for you to fill out.   Conditions/risks identified: Aim for 30 minutes of exercise or brisk walking, 6-8 glasses of water, and 5 servings of fruits and vegetables each day.   Next appointment: Follow up in one year for your annual wellness visit 2024.   Preventive Care 69 Years and Older, Female Preventive care refers to lifestyle choices and visits with your health care provider that can promote health and wellness. What does preventive care include? A yearly physical exam. This is also called an annual well check. Dental exams once or twice a year. Routine eye exams. Ask your health care provider how often you should have your eyes checked. Personal lifestyle choices, including: Daily care of your teeth and gums. Regular physical activity. Eating a healthy diet. Avoiding tobacco and drug use. Limiting alcohol use. Practicing safe sex. Taking low-dose aspirin every day. Taking vitamin and mineral supplements as recommended by your health care provider. What happens during an annual well check? The services  and screenings done by your health care provider during your annual well check will depend on your age, overall health, lifestyle risk factors, and family history of disease. Counseling  Your health care provider may ask you questions about your: Alcohol use. Tobacco use. Drug use. Emotional well-being. Home and relationship well-being. Sexual activity. Eating habits. History of falls. Memory and ability to understand (cognition). Work and work Statistician. Reproductive health. Screening  You may have the following tests or measurements: Height, weight, and BMI. Blood pressure. Lipid and cholesterol levels. These may be checked every 5 years, or more frequently if you are over 69 years old. Skin check. Lung cancer screening. You may have this screening every year starting at age 69 if you have a 30-pack-year history of smoking and currently smoke or have quit within the past 15 years. Fecal occult blood test (FOBT) of the stool. You may have this test every year starting at age 69. Flexible sigmoidoscopy or colonoscopy. You may have a sigmoidoscopy every 5 years or a colonoscopy every 10 years starting at age 69. Hepatitis C blood test. Hepatitis B blood test. Sexually transmitted disease (STD) testing. Diabetes screening. This is done by checking your blood sugar (glucose) after you have not eaten for a while (fasting). You may have this done every 1-3 years. Bone density scan. This is done to screen for osteoporosis. You may have this done starting at age 69. Mammogram. This may be done every 1-2 years. Talk to your health care provider about how often you should have regular mammograms. Talk with your health care provider about your test results, treatment options, and if necessary, the need for more tests. Vaccines  Your health care provider may recommend certain vaccines,  such as: Influenza vaccine. This is recommended every year. Tetanus, diphtheria, and acellular pertussis  (Tdap, Td) vaccine. You may need a Td booster every 10 years. Zoster vaccine. You may need this after age 69. Pneumococcal 13-valent conjugate (PCV13) vaccine. One dose is recommended after age 24. Pneumococcal polysaccharide (PPSV23) vaccine. One dose is recommended after age 14. Talk to your health care provider about which screenings and vaccines you need and how often you need them. This information is not intended to replace advice given to you by your health care provider. Make sure you discuss any questions you have with your health care provider. Document Released: 06/19/2015 Document Revised: 02/10/2016 Document Reviewed: 03/24/2015 Elsevier Interactive Patient Education  2017 [place] Prevention in the Home Falls can cause injuries. They can happen to people of all ages. There are many things you can do to make your home safe and to help prevent falls. What can I do on the outside of my home? Regularly fix the edges of walkways and driveways and fix any cracks. Remove anything that might make you trip as you walk through a door, such as a raised step or threshold. Trim any bushes or trees on the path to your home. Use bright outdoor lighting. Clear any walking paths of anything that might make someone trip, such as rocks or tools. Regularly check to see if handrails are loose or broken. Make sure that both sides of any steps have handrails. Any raised decks and porches should have guardrails on the edges. Have any leaves, snow, or ice cleared regularly. Use sand or salt on walking paths during winter. Clean up any spills in your garage right away. This includes oil or grease spills. What can I do in the bathroom? Use night lights. Install grab bars by the toilet and in the tub and shower. Do not use towel bars as grab bars. Use non-skid mats or decals in the tub or shower. If you need to sit down in the shower, use a plastic, non-slip stool. Keep the floor dry. Clean  up any water that spills on the floor as soon as it happens. Remove soap buildup in the tub or shower regularly. Attach bath mats securely with double-sided non-slip rug tape. Do not have throw rugs and other things on the floor that can make you trip. What can I do in the bedroom? Use night lights. Make sure that you have a light by your bed that is easy to reach. Do not use any sheets or blankets that are too big for your bed. They should not hang down onto the floor. Have a firm chair that has side arms. You can use this for support while you get dressed. Do not have throw rugs and other things on the floor that can make you trip. What can I do in the kitchen? Clean up any spills right away. Avoid walking on wet floors. Keep items that you use a lot in easy-to-reach places. If you need to reach something above you, use a strong step stool that has a grab bar. Keep electrical cords out of the way. Do not use floor polish or wax that makes floors slippery. If you must use wax, use non-skid floor wax. Do not have throw rugs and other things on the floor that can make you trip. What can I do with my stairs? Do not leave any items on the stairs. Make sure that there are handrails on both sides of the stairs and  use them. Fix handrails that are broken or loose. Make sure that handrails are as long as the stairways. Check any carpeting to make sure that it is firmly attached to the stairs. Fix any carpet that is loose or worn. Avoid having throw rugs at the top or bottom of the stairs. If you do have throw rugs, attach them to the floor with carpet tape. Make sure that you have a light switch at the top of the stairs and the bottom of the stairs. If you do not have them, ask someone to add them for you. What else can I do to help prevent falls? Wear shoes that: Do not have high heels. Have rubber bottoms. Are comfortable and fit you well. Are closed at the toe. Do not wear sandals. If you  use a stepladder: Make sure that it is fully opened. Do not climb a closed stepladder. Make sure that both sides of the stepladder are locked into place. Ask someone to hold it for you, if possible. Clearly mark and make sure that you can see: Any grab bars or handrails. First and last steps. Where the edge of each step is. Use tools that help you move around (mobility aids) if they are needed. These include: Canes. Walkers. Scooters. Crutches. Turn on the lights when you go into a dark area. Replace any light bulbs as soon as they burn out. Set up your furniture so you have a clear path. Avoid moving your furniture around. If any of your floors are uneven, fix them. If there are any pets around you, be aware of where they are. Review your medicines with your doctor. Some medicines can make you feel dizzy. This can increase your chance of falling. Ask your doctor what other things that you can do to help prevent falls. This information is not intended to replace advice given to you by your health care provider. Make sure you discuss any questions you have with your health care provider. Document Released: 03/19/2009 Document Revised: 10/29/2015 Document Reviewed: 06/27/2014 Elsevier Interactive Patient Education  2017 [employer].

## 2021-08-03 NOTE — Progress Notes (Signed)
Subjective:   Patricia Fuller is a 71 y.o. female who presents for an Initial Medicare Annual Wellness Visit. Virtual Visit via Telephone Note  I connected with  Patricia Fuller on 08/03/21 at  8:20 AM EST by telephone and verified that I am speaking with the correct person using two identifiers.  Location: Patient: HOME Provider: [facility] Persons participating in the virtual visit: patient/Nurse Health Advisor   I discussed the limitations, risks, security and privacy concerns of performing an evaluation and management service by telephone and the availability of in person appointments. The patient expressed understanding and agreed to proceed.  Interactive audio and video telecommunications were attempted between this nurse and patient, however failed, due to patient having technical difficulties OR patient did not have access to video capability.  We continued and completed visit with audio only.  Some vital signs may be absent or patient reported.   Chriss Driver, LPN  Review of Systems     Cardiac Risk Factors include: advanced age (>35men, >43 women);hypertension;dyslipidemia;sedentary lifestyle;obesity (BMI >30kg/m2)     Objective:    Today's Vitals   08/03/21 0820  Weight: 226 lb (102.5 kg)  Height: 5\' 6"  (1.676 m)   Body mass index is 36.48 kg/m.  Advanced Directives 08/03/2021 10/19/2020 07/19/2020 07/16/2014 01/13/2014 01/02/2014 12/26/2013  Does Patient Have a Medical Advance Directive? No No No No Patient does not have advance directive;Patient would not like information Patient does not have advance directive;Patient would not like information Patient does not have advance directive;Patient would like information  Would patient like information on creating a medical advance directive? No - Patient declined Yes (MAU/Ambulatory/Procedural Areas - Information given) No - Patient declined No - patient declined information - - Advance directive packet given  Pre-existing out of  facility DNR order (yellow form or pink MOST form) - - - - No - -    Current Medications (verified) Outpatient Encounter Medications as of 08/03/2021  Medication Sig   acetaminophen (TYLENOL) 500 MG tablet Take 500 mg by mouth every 6 (six) hours as needed for mild pain or moderate pain.   cetirizine (ZYRTEC) 10 MG tablet Take 10 mg by mouth daily as needed for allergies.   losartan (COZAAR) 50 MG tablet Take 1 tablet (50 mg total) by mouth every morning.   metFORMIN (GLUCOPHAGE) 500 MG tablet Take 0.5 tablets (250 mg total) by mouth 2 (two) times daily with a meal.   rosuvastatin (CRESTOR) 10 MG tablet Take 1 tablet (10 mg total) by mouth daily.   vitamin B-12 (CYANOCOBALAMIN) 1000 MCG tablet Take 1,000 mcg by mouth 3 (three) times a week.   vitamin C (ASCORBIC ACID) 500 MG tablet Take 500 mg by mouth 3 (three) times a week.   zinc gluconate 50 MG tablet Take 50 mg by mouth 3 (three) times a week.   No facility-administered encounter medications on file as of 08/03/2021.    Allergies (verified) Patient has no known allergies.   History: Past Medical History:  Diagnosis Date   Hyperlipidemia    Hypertension    Prediabetes    Renal calculi    Past Surgical History:  Procedure Laterality Date   BIOPSY  10/19/2020   Procedure: BIOPSY;  Surgeon: Eloise Harman, DO;  [facility];  Service: Endoscopy;;  transverse colon polyp   COLONOSCOPY WITH PROPOFOL N/A 10/19/2020   Procedure: COLONOSCOPY WITH PROPOFOL;  Surgeon: Eloise Harman, DO;  Location: [facility];  Service: Endoscopy;  Laterality: N/A;  am appt   LITHOTRIPSY     POLYPECTOMY  10/19/2020   Procedure: POLYPECTOMY;  Surgeon: Eloise Harman, DO;  Location: [facility];  Service: Endoscopy;;  transverse colon polyp, descending colon polyp, sigmoid colon polyp    TUBAL LIGATION     Family History  Problem Relation Age of Onset   Hypertension Mother    Diabetes Mother    Heart disease Mother    Colon  cancer Neg Hx    Social History   Socioeconomic History   Marital status: Single    Spouse name: Not on file   Number of children: Not on file   Years of education: Not on file   Highest education level: Not on file  Occupational History   Not on file  Tobacco Use   Smoking status: Never   Smokeless tobacco: Never  Vaping Use   Vaping Use: Never used  Substance and Sexual Activity   Alcohol use: No   Drug use: No   Sexual activity: Never  Other Topics Concern   Not on file  Social History Narrative   Not on file   Social Determinants of Health   Financial Resource Strain: Low Risk    Difficulty of Paying Living Expenses: Not hard at all  Food Insecurity: No Food Insecurity   Worried About Charity fundraiser in the Last Year: Never true   Ran Out of Food in the Last Year: Never true  Transportation Needs: No Transportation Needs   Lack of Transportation (Medical): No   Lack of Transportation (Non-Medical): No  Physical Activity: Insufficiently Active   Days of Exercise per Week: 3 days   Minutes of Exercise per Session: 30 min  Stress: No Stress Concern Present   Feeling of Stress : Not at all  Social Connections: Socially Isolated   Frequency of Communication with Friends and Family: More than three times a week   Frequency of Social Gatherings with Friends and Family: More than three times a week   Attends Religious Services: Never   Marine scientist or Organizations: No   Attends Music therapist: Never   Marital Status: Divorced    Tobacco Counseling Counseling given: Not Answered   Clinical Intake:  Pre-visit preparation completed: Yes  Pain : No/denies pain     BMI - recorded: 36.48 Nutritional Status: BMI > 30  Obese Nutritional Risks: None Diabetes: No  How often do you need to have someone help you when you read instructions, pamphlets, or other written materials from your doctor or pharmacy?: 1 -  Never  Diabetic?pre-diabetes  Interpreter Needed?: No  Information entered by :: mj Jariana Shumard, lpn   Activities of Daily Living In your present state of health, do you have any difficulty performing the following activities: 08/03/2021  Hearing? N  Vision? N  Difficulty concentrating or making decisions? Y  Walking or climbing stairs? N  Dressing or bathing? N  Doing errands, shopping? N  Preparing Food and eating ? N  Using the Toilet? N  In the past six months, have you accidently leaked urine? N  Do you have problems with loss of bowel control? N  Managing your Medications? N  Managing your Finances? N  Housekeeping or managing your Housekeeping? N  Some recent data might be hidden    Patient Care Team: Kathyrn Drown, MD as PCP - General (Family Medicine) Eloise Harman, DO as Consulting Physician (Gastroenterology)  Indicate any recent  Medical Services you may have received from other than [facility] providers in the past year (date may be approximate).     Assessment:   This is a routine wellness examination for [facility].  Hearing/Vision screen Vision Screening - Comments:: [facility] 2021  Dietary issues and exercise activities discussed: Current Exercise Habits: Home exercise routine, Type of exercise: walking, Time (Minutes): 30, Frequency (Times/Week): 3, Weekly Exercise (Minutes/Week): 90, Intensity: Mild, Exercise limited by: cardiac condition(s)   Goals Addressed             This Visit's Progress    Exercise 3x per week (30 min per time)       Continue exercise and eat helathy       Depression Screen PHQ 2/9 Scores 08/03/2021 01/18/2021 07/13/2020 06/19/2020 12/17/2019 09/04/2017 08/09/2016  PHQ - 2 Score 0 0 0 0 0 2 0    Fall Risk Fall Risk  08/03/2021 01/18/2021 07/13/2020 06/19/2020 12/17/2019  Falls in the past year? 0 0 0 0 0  Number falls in past yr: 0 - - - 0  Injury with Fall? 0 - - - 0  Risk for fall due to : No Fall Risks No Fall Risks - - -   Follow up Falls prevention discussed Falls evaluation completed Falls evaluation completed Falls evaluation completed Falls evaluation completed    [place]:  Any stairs in or around the home? Yes  If so, are there any without handrails? No  Home free of loose throw rugs in walkways, pet beds, electrical cords, etc? Yes  Adequate lighting in your home to reduce risk of falls? Yes   ASSISTIVE DEVICES UTILIZED TO PREVENT FALLS:  Life alert? No  Use of a cane, walker or w/c? No  Grab bars in the bathroom? Yes  Shower chair or bench in shower? No  Elevated toilet seat or a handicapped toilet? Yes   TIMED UP AND GO:  Was the test performed? No .  Phone visit  Cognitive Function:     6CIT Screen 08/03/2021  What Year? 0 points  What month? 0 points  What time? 0 points  Count back from 20 0 points  Months in reverse 0 points  Repeat phrase 2 points  Total Score 2    Immunizations  There is no immunization history on file for this patient.  TDAP status: Due, Education has been provided regarding the importance of this vaccine. Advised may receive this vaccine at local pharmacy or Health Dept. Aware to provide a copy of the vaccination record if obtained from local pharmacy or Health Dept. Verbalized acceptance and understanding.  Flu Vaccine status: Declined, Education has been provided regarding the importance of this vaccine but patient still declined. Advised may receive this vaccine at local pharmacy or Health Dept. Aware to provide a copy of the vaccination record if obtained from local pharmacy or Health Dept. Verbalized acceptance and understanding.  Pneumococcal vaccine status: Declined,  Education has been provided regarding the importance of this vaccine but patient still declined. Advised may receive this vaccine at local pharmacy or Health Dept. Aware to provide a copy of the vaccination record if obtained from local pharmacy or  Health Dept. Verbalized acceptance and understanding.   Covid-19 vaccine status: Declined, Education has been provided regarding the importance of this vaccine but patient still declined. Advised may receive this vaccine at local pharmacy or Health Dept.or vaccine clinic. Aware to provide a copy of the vaccination record  if obtained from local pharmacy or Health Dept. Verbalized acceptance and understanding.  Qualifies for Shingles Vaccine? Yes   Zostavax completed No   Shingrix Completed?: No.    Education has been provided regarding the importance of this vaccine. Patient has been advised to call insurance company to determine out of pocket expense if they have not yet received this vaccine. Advised may also receive vaccine at local pharmacy or Health Dept. Verbalized acceptance and understanding.  Screening Tests Health Maintenance  Topic Date Due   OPHTHALMOLOGY EXAM  Never done   TETANUS/TDAP  Never done   Zoster Vaccines- Shingrix (1 of 2) Never done   DEXA SCAN  Never done   Pneumonia Vaccine 62+ Years old (1 - PCV) 06/30/2022 (Originally 02/05/2018)   MAMMOGRAM  06/30/2022 (Originally 02/06/2003)   HEMOGLOBIN A1C  12/06/2021   FOOT EXAM  06/30/2022   COLONOSCOPY (Pts 45-82yrs Insurance coverage will need to be confirmed)  10/20/2023   Hepatitis C Screening  Completed   HPV VACCINES  Aged Out   INFLUENZA VACCINE  Discontinued   COLON CANCER SCREENING ANNUAL FOBT  Discontinued   COVID-19 Vaccine  Discontinued    Health Maintenance  Health Maintenance Due  Topic Date Due   OPHTHALMOLOGY EXAM  Never done   TETANUS/TDAP  Never done   Zoster Vaccines- Shingrix (1 of 2) Never done   DEXA SCAN  Never done    Colorectal cancer screening: Type of screening: Colonoscopy. Completed 10/19/2020. Repeat every 3 years  Mammogram status: Ordered PT DECLINED. Pt provided with contact info and advised to call to schedule appt.   Bone Density status: Ordered PT DECLINED. Pt provided with  contact info and advised to call to schedule appt.  Lung Cancer Screening: (Low Dose CT Chest recommended if Age 97-80 years, 30 pack-year currently smoking OR have quit w/in 15years.) does not qualify.    Additional Screening:  Hepatitis C Screening: does qualify; Completed 08/23/2017  Vision Screening: Recommended annual ophthalmology exams for early detection of glaucoma and other disorders of the eye. Is the patient up to date with their annual eye exam?  No  Who is the provider or what is the name of the office in which the patient attends annual eye exams? [facility] If pt is not established with a provider, would they like to be referred to a provider to establish care? No .   Dental Screening: Recommended annual dental exams for proper oral hygiene  Community Resource Referral / Chronic Care Management: CRR required this visit?  No   CCM required this visit?  No      Plan:     I have personally reviewed and noted the following in the patients chart:   Medical and social history Use of alcohol, tobacco or illicit drugs  Current medications and supplements including opioid prescriptions. Patient is not currently taking opioid prescriptions. Functional ability and status Nutritional status Physical activity Advanced directives List of other physicians Hospitalizations, surgeries, and ER visits in previous 12 months Vitals Screenings to include cognitive, depression, and falls Referrals and appointments  In addition, I have reviewed and discussed with patient certain preventive protocols, quality metrics, and best practice recommendations. A written personalized care plan for preventive services as well as general preventive health recommendations were provided to patient.     Whisper Kurka, LPN   1/82/9937   Nurse Notes: Pt declined all vaccines and additional health maintenance. Pt is up to date on colonoscopy.

## 2021-12-28 ENCOUNTER — Ambulatory Visit: Payer: Self-pay | Admitting: Family Medicine

## 2022-01-10 DIAGNOSIS — E1169 Type 2 diabetes mellitus with other specified complication: Secondary | ICD-10-CM | POA: Diagnosis not present

## 2022-01-10 DIAGNOSIS — E785 Hyperlipidemia, unspecified: Secondary | ICD-10-CM | POA: Diagnosis not present

## 2022-01-10 DIAGNOSIS — E7849 Other hyperlipidemia: Secondary | ICD-10-CM | POA: Diagnosis not present

## 2022-01-10 DIAGNOSIS — I10 Essential (primary) hypertension: Secondary | ICD-10-CM | POA: Diagnosis not present

## 2022-01-11 LAB — BASIC METABOLIC PANEL
BUN/Creatinine Ratio: 18 (ref 12–28)
BUN: 15 mg/dL (ref 8–27)
CO2: 24 mmol/L (ref 20–29)
Calcium: 10 mg/dL (ref 8.7–10.3)
Chloride: 102 mmol/L (ref 96–106)
Creatinine, Ser: 0.83 mg/dL (ref 0.57–1.00)
Glucose: 141 mg/dL — ABNORMAL HIGH (ref 70–99)
Potassium: 4.5 mmol/L (ref 3.5–5.2)
Sodium: 141 mmol/L (ref 134–144)
eGFR: 77 mL/min/{1.73_m2} (ref >59)

## 2022-01-11 LAB — LIPID PANEL
Chol/HDL Ratio: 3.1 ratio (ref 0.0–4.4)
Cholesterol, Total: 164 mg/dL (ref 100–199)
HDL: 53 mg/dL (ref >39)
LDL Chol Calc (NIH): 71 mg/dL (ref 0–99)
Triglycerides: 250 mg/dL — ABNORMAL HIGH (ref 0–149)
VLDL Cholesterol Cal: 40 mg/dL (ref 5–40)

## 2022-01-11 LAB — HEMOGLOBIN A1C
Est. average glucose Bld gHb Est-mCnc: 134 mg/dL
Hgb A1c MFr Bld: 6.3 % — ABNORMAL HIGH (ref 4.8–5.6)

## 2022-01-11 LAB — HEPATIC FUNCTION PANEL
ALT: 20 IU/L (ref 0–32)
AST: 18 IU/L (ref 0–40)
Albumin: 4.7 g/dL (ref 3.9–4.9)
Alkaline Phosphatase: 95 IU/L (ref 44–121)
Bilirubin Total: 0.5 mg/dL (ref 0.0–1.2)
Bilirubin, Direct: 0.16 mg/dL (ref 0.00–0.40)
Total Protein: 6.8 g/dL (ref 6.0–8.5)

## 2022-01-11 LAB — MICROALBUMIN / CREATININE URINE RATIO
Creatinine, Urine: 290.8 mg/dL
Microalb/Creat Ratio: 10 mg/g creat (ref 0–29)
Microalbumin, Urine: 27.7 ug/mL

## 2022-01-12 ENCOUNTER — Encounter: Payer: Self-pay | Admitting: Family Medicine

## 2022-01-12 ENCOUNTER — Ambulatory Visit (INDEPENDENT_AMBULATORY_CARE_PROVIDER_SITE_OTHER): Payer: PPO | Admitting: Family Medicine

## 2022-01-12 VITALS — BP 120/78 | HR 72 | Temp 97.5°F | Ht 66.0 in | Wt 227.0 lb

## 2022-01-12 DIAGNOSIS — E1169 Type 2 diabetes mellitus with other specified complication: Secondary | ICD-10-CM

## 2022-01-12 DIAGNOSIS — E7849 Other hyperlipidemia: Secondary | ICD-10-CM

## 2022-01-12 DIAGNOSIS — E785 Hyperlipidemia, unspecified: Secondary | ICD-10-CM | POA: Diagnosis not present

## 2022-01-12 DIAGNOSIS — I10 Essential (primary) hypertension: Secondary | ICD-10-CM

## 2022-01-12 MED ORDER — LOSARTAN POTASSIUM 50 MG PO TABS: 50.0000 mg | ORAL_TABLET | Freq: Every morning | ORAL | 1 refills | Status: DC

## 2022-01-12 MED ORDER — METFORMIN HCL 500 MG PO TABS: 250.0000 mg | ORAL_TABLET | Freq: Two times a day (BID) | ORAL | 1 refills | Status: DC

## 2022-01-12 MED ORDER — ROSUVASTATIN CALCIUM 10 MG PO TABS: 10.0000 mg | ORAL_TABLET | Freq: Every day | ORAL | 1 refills | Status: DC

## 2022-01-12 NOTE — Patient Instructions (Signed)
Your blood work looks very good Your blood pressure looks good Keep up the good work Continue to try to eat healthy and stay active  We will see you back in about 6 months Take care-Dr. Nicki Reaper  If you change your mind on the pneumonia vaccine please let me know

## 2022-01-12 NOTE — Progress Notes (Signed)
   Subjective:    Patient ID: Patricia Fuller, female    DOB: [birth year], 71 y.o.   MRN: [id number]  HPI Patient for blood pressure check up.  The patient does have hypertension.    Medication compliance-good  Blood pressure control recently-blood pressure has been under good control  Dietary compliance-tries minimize salt does not do much in the way of walking  Patient here for follow-up regarding cholesterol.    Diet-tries to minimize fried foods tries minimize starches  Compliance with medicine-good compliance  Side effects-denies side effects  Activity-stays busy at the house but does not do much in the way of walking  Regular lab work regarding lipid and liver was checked and if needing additional labs was appropriately ordered  The patient was seen today as part of a comprehensive diabetic check up. Patient has diabetes  Compliance-good compliance Low sugars-minimizes starches Dietary effort-fairly good Foot exam and ophthalmology exam requirements were reviewed     Review of Systems     Objective:   Physical Exam General-in no acute distress Eyes-no discharge Lungs-respiratory rate normal, CTA CV-no murmurs,RRR Extremities skin warm dry no edema Neuro grossly normal Behavior normal, alert  Patient defers on bone density defers on pneumococcal vaccine defers on ophthalmologic exam      Assessment & Plan:  Patient does not like the idea of doing mammograms immunizations eye exams etc. we discussed all of this in detail the importance of trying to do these to minimize her risk of negative health issues  1. Other hyperlipidemia Continue medication labs look good follow-up 6 months  2. Essential hypertension, benign Blood pressure good watch diet stay active minimize salt  3. Hyperlipidemia associated with type 2 diabetes mellitus (HCC) Minimize starches continue medications A1c looks good

## 2022-04-26 ENCOUNTER — Encounter (HOSPITAL_COMMUNITY): Payer: Self-pay | Admitting: *Deleted

## 2022-04-26 ENCOUNTER — Emergency Department (HOSPITAL_COMMUNITY): Payer: PPO

## 2022-04-26 ENCOUNTER — Other Ambulatory Visit: Payer: Self-pay

## 2022-04-26 ENCOUNTER — Emergency Department (HOSPITAL_COMMUNITY)
Admission: EM | Admit: 2022-04-26 | Discharge: 2022-04-26 | Disposition: A | Discharge status: Home / Self Care | Payer: PPO | Attending: Emergency Medicine | Admitting: Emergency Medicine

## 2022-04-26 DIAGNOSIS — R1084 Generalized abdominal pain: Secondary | ICD-10-CM | POA: Diagnosis present

## 2022-04-26 DIAGNOSIS — Z79899 Other long term (current) drug therapy: Secondary | ICD-10-CM | POA: Diagnosis not present

## 2022-04-26 DIAGNOSIS — R109 Unspecified abdominal pain: Secondary | ICD-10-CM | POA: Diagnosis not present

## 2022-04-26 DIAGNOSIS — K8020 Calculus of gallbladder without cholecystitis without obstruction: Secondary | ICD-10-CM | POA: Insufficient documentation

## 2022-04-26 DIAGNOSIS — I10 Essential (primary) hypertension: Secondary | ICD-10-CM | POA: Insufficient documentation

## 2022-04-26 DIAGNOSIS — N281 Cyst of kidney, acquired: Secondary | ICD-10-CM | POA: Diagnosis not present

## 2022-04-26 DIAGNOSIS — K5730 Diverticulosis of large intestine without perforation or abscess without bleeding: Secondary | ICD-10-CM | POA: Diagnosis not present

## 2022-04-26 DIAGNOSIS — K449 Diaphragmatic hernia without obstruction or gangrene: Secondary | ICD-10-CM | POA: Diagnosis not present

## 2022-04-26 LAB — BASIC METABOLIC PANEL
Anion gap: 8 (ref 5–15)
BUN: 16 mg/dL (ref 8–23)
CO2: 25 mmol/L (ref 22–32)
Calcium: 9.9 mg/dL (ref 8.9–10.3)
Chloride: 106 mmol/L (ref 98–111)
Creatinine, Ser: 0.77 mg/dL (ref 0.44–1.00)
GFR, Estimated: >60 mL/min (ref >60)
Glucose, Bld: 119 mg/dL — ABNORMAL HIGH (ref 70–99)
Potassium: 4.1 mmol/L (ref 3.5–5.1)
Sodium: 139 mmol/L (ref 135–145)

## 2022-04-26 LAB — URINALYSIS, ROUTINE W REFLEX MICROSCOPIC
Bilirubin Urine: NEGATIVE
Glucose, UA: NEGATIVE mg/dL
Hgb urine dipstick: NEGATIVE
Ketones, ur: NEGATIVE mg/dL
Leukocytes,Ua: NEGATIVE
Nitrite: NEGATIVE
Protein, ur: NEGATIVE mg/dL
Specific Gravity, Urine: >1.03 — ABNORMAL HIGH (ref 1.005–1.030)
pH: 5.5 (ref 5.0–8.0)

## 2022-04-26 LAB — CBC WITH DIFFERENTIAL/PLATELET
Abs Immature Granulocytes: 0.01 10*3/uL (ref 0.00–0.07)
Basophils Absolute: 0 10*3/uL (ref 0.0–0.1)
Basophils Relative: 1 %
Eosinophils Absolute: 0.2 10*3/uL (ref 0.0–0.5)
Eosinophils Relative: 3 %
HCT: 43.8 % (ref 36.0–46.0)
Hemoglobin: 14.2 g/dL (ref 12.0–15.0)
Immature Granulocytes: 0 %
Lymphocytes Relative: 37 %
Lymphs Abs: 2.8 10*3/uL (ref 0.7–4.0)
MCH: 29.6 pg (ref 26.0–34.0)
MCHC: 32.4 g/dL (ref 30.0–36.0)
MCV: 91.3 fL (ref 80.0–100.0)
Monocytes Absolute: 0.7 10*3/uL (ref 0.1–1.0)
Monocytes Relative: 9 %
Neutro Abs: 3.8 10*3/uL (ref 1.7–7.7)
Neutrophils Relative %: 50 %
Platelets: 196 10*3/uL (ref 150–400)
RBC: 4.8 MIL/uL (ref 3.87–5.11)
RDW: 13.1 % (ref 11.5–15.5)
WBC: 7.6 10*3/uL (ref 4.0–10.5)
nRBC: 0 % (ref 0.0–0.2)

## 2022-04-26 MED ORDER — ACETAMINOPHEN 500 MG PO TABS
1000.0000 mg | ORAL_TABLET | Freq: Once | ORAL | Status: AC
  Administered 2022-04-26: 1000 mg via ORAL
  Filled 2022-04-26: qty 2

## 2022-04-26 MED ORDER — IBUPROFEN 800 MG PO TABS
ORAL_TABLET | ORAL | Status: AC
  Filled 2022-04-26: qty 1

## 2022-04-26 MED ORDER — IBUPROFEN 800 MG PO TABS
800.0000 mg | ORAL_TABLET | Freq: Once | ORAL | Status: AC
  Administered 2022-04-26: 800 mg via ORAL

## 2022-04-26 MED ORDER — METHOCARBAMOL 500 MG PO TABS: 500.0000 mg | ORAL_TABLET | Freq: Two times a day (BID) | ORAL | 0 refills | Status: DC

## 2022-04-26 NOTE — Discharge Instructions (Signed)
You have been seen today for your complaint of flank pain. Your lab work was reassuring as was abnormalities. Your imaging showed some abnormalities of the gallbladder, was otherwise reassuring. Your discharge medications include Robaxin.  This is a muscle relaxer.  You may take it as needed.  You may take it along with Tylenol and ibuprofen. Follow up with: Dr. Jenetta Downer.  He is a GI doctor.  You should call tomorrow to schedule an appointment. Please seek immediate medical care if you develop any of the following symptoms: You have trouble breathing or you are short of breath. Your abdomen hurts or it is swollen or red. You have nausea or vomiting. You feel faint, or you faint. You have blood in your urine. You have flank pain and a fever. At this time there does not appear to be the presence of an emergent medical condition, however there is always the potential for conditions to change. Please read and follow the below instructions.  Do not take your medicine if  develop an itchy rash, swelling in your mouth or lips, or difficulty breathing; call 911 and seek immediate emergency medical attention if this occurs.  You may review your lab tests and imaging results in their entirety on your MyChart account.  Please discuss all results of fully with your primary care provider and other specialist at your follow-up visit.  Note: Portions of this text may have been transcribed using voice recognition software. Every effort was made to ensure accuracy; however, inadvertent computerized transcription errors may still be present.

## 2022-04-26 NOTE — ED Triage Notes (Signed)
Pt states having back pain for 2 weeks started in the back and now it's towards the stomach, nausea and diarrhea started this morning, pt denies vomiting, pt states she is peeing fine.

## 2022-04-26 NOTE — ED Provider Notes (Signed)
[facility] EMERGENCY DEPARTMENT Provider Note   CSN: [id number] Arrival date & time: 04/26/22  1825     History  Chief Complaint  Patient presents with   Back Pain    Patricia Fuller is a 71 y.o. female.With history of kidney stones, who presents to the ED for evaluation of right-sided flank pain.  Patient states the pain began 2 weeks ago.  Pain has been constant throughout that time.  Describes it as an aching pain.  Rates it at a 6 out of 10.  Pain is worsened with forward flexion and rotation.  Has tried Tylenol with relief at home.  States she had some generalized abdominal pain earlier today while she was folding laundry, however she passed gas and this pain went away.  Has had kidney stones on the left side, but states that the pain was about the same as it is now.  Denies traumatic injury.  Denies fevers, chills, dysuria, frequency, urgency, pelvic pain, nausea, vomiting, diarrhea, melena, hematochezia.    Back Pain      Home Medications Prior to Admission medications   Medication Sig Start Date End Date Taking? Authorizing Provider  methocarbamol (ROBAXIN) 500 MG tablet Take 1 tablet (500 mg total) by mouth 2 (two) times daily. 04/26/22  Yes Tamel Abel, [place] Folk, PA-C  acetaminophen (TYLENOL) 500 MG tablet Take 500 mg by mouth every 6 (six) hours as needed for mild pain or moderate pain. Patient not taking: Reported on 01/12/2022    [provider]  cetirizine (ZYRTEC) 10 MG tablet Take 10 mg by mouth daily as needed for allergies.    [provider]  losartan (COZAAR) 50 MG tablet Take 1 tablet (50 mg total) by mouth every morning. 01/12/22   Kathyrn Drown, MD  metFORMIN (GLUCOPHAGE) 500 MG tablet Take 0.5 tablets (250 mg total) by mouth 2 (two) times daily with a meal. 01/12/22   Luking, Elayne Snare, MD  rosuvastatin (CRESTOR) 10 MG tablet Take 1 tablet (10 mg total) by mouth daily. 01/12/22   Kathyrn Drown, MD  vitamin B-12 (CYANOCOBALAMIN) 1000 MCG tablet Take  1,000 mcg by mouth 3 (three) times a week. Patient not taking: Reported on 01/12/2022    [provider]  vitamin C (ASCORBIC ACID) 500 MG tablet Take 500 mg by mouth 3 (three) times a week. Patient not taking: Reported on 01/12/2022    [provider]  zinc gluconate 50 MG tablet Take 50 mg by mouth 3 (three) times a week. Patient not taking: Reported on 01/12/2022    [provider]      Allergies    Patient has no known allergies.    Review of Systems   Review of Systems  Genitourinary:  Positive for flank pain.    Physical Exam Updated Vital Signs BP (!) 162/84 (BP Location: Left Arm)   Pulse 66   Temp 98 F (36.7 C) (Oral)   Resp 17   Ht '5\' 6"'$  (1.676 m)   Wt 104.3 kg   SpO2 95%   BMI 37.12 kg/m  Physical Exam Vitals and nursing note reviewed.  Constitutional:      General: She is not in acute distress.    Appearance: Normal appearance. She is well-developed. She is not ill-appearing, toxic-appearing or diaphoretic.  HENT:     Head: Normocephalic and atraumatic.  Eyes:     Conjunctiva/sclera: Conjunctivae normal.  Cardiovascular:     Rate and Rhythm: Normal rate and regular rhythm.  Heart sounds: No murmur heard. Pulmonary:     Effort: Pulmonary effort is normal. No respiratory distress.     Breath sounds: Normal breath sounds. No stridor. No wheezing, rhonchi or rales.  Abdominal:     Palpations: Abdomen is soft.     Tenderness: There is no abdominal tenderness. There is no right CVA tenderness, left CVA tenderness, guarding or rebound.  Musculoskeletal:        General: No swelling.     Cervical back: Neck supple.     Right lower leg: No edema.     Left lower leg: No edema.  Skin:    General: Skin is warm and dry.     Capillary Refill: Capillary refill takes less than 2 seconds.  Neurological:     General: No focal deficit present.     Mental Status: She is alert and oriented to person, place, and time.  Psychiatric:        Mood  and Affect: Mood normal.     ED Results / Procedures / Treatments   Labs (all labs ordered are listed, but only abnormal results are displayed) Labs Reviewed  BASIC METABOLIC PANEL - Abnormal; Notable for the following components:      Result Value   Glucose, Bld 119 (*)    All other components within normal limits  URINALYSIS, ROUTINE W REFLEX MICROSCOPIC - Abnormal; Notable for the following components:   Specific Gravity, Urine >1.030 (*)    All other components within normal limits  CBC WITH DIFFERENTIAL/PLATELET    EKG None  Radiology CT Renal Stone Study  Result Date: 04/26/2022 CLINICAL DATA:  Abdominal pain and flank pain. EXAM: CT ABDOMEN AND PELVIS WITHOUT CONTRAST TECHNIQUE: Multidetector CT imaging of the abdomen and pelvis was performed following the standard protocol without IV contrast. RADIATION DOSE REDUCTION: This exam was performed according to the departmental dose-optimization program which includes automated exposure control, adjustment of the mA and/or kV according to patient size and/or use of iterative reconstruction technique. COMPARISON:  CT abdomen and pelvis 11/26/2013 FINDINGS: Lower chest: No acute abnormality. Hepatobiliary: There is hypodensity adjacent to the gallbladder fossa likely related to focal fatty infiltration, new from prior. Other etiologies are not excluded. This area measures 3.0 by 1.9 cm. Gallstones are present. There is no biliary ductal dilatation. Pancreas: Unremarkable. No pancreatic ductal dilatation or surrounding inflammatory changes. Spleen: Normal in size without focal abnormality. Adrenals/Urinary Tract: Small peripelvic cysts are seen bilaterally. There is no urinary tract calculus or hydronephrosis. The adrenal glands and bladder are within normal limits. Stomach/Bowel: Small hiatal hernia is present. Stomach is otherwise within normal limits. Appendix appears normal. No evidence of bowel wall thickening, distention, or  inflammatory changes. There is sigmoid colon diverticulosis. Vascular/Lymphatic: Aortic atherosclerosis. Aorta is normal in size. There is an enlarged right iliac chain lymph node measuring 13 mm short axis which is unchanged from the prior examination. There are additional nonenlarged iliac chain and retroperitoneal lymph nodes similar to prior. Reproductive: Uterus and bilateral adnexa are unremarkable. Other: No abdominal wall hernia or abnormality. No abdominopelvic ascites. Musculoskeletal: No acute or significant osseous findings. IMPRESSION: 1. No acute localizing process in the abdomen or pelvis. 2. Cholelithiasis. 3. Focal fatty infiltration of the liver adjacent to the gallbladder fossa. Other etiologies are not excluded. This can be further evaluated with ultrasound or MRI as clinically warranted. 4. Small hiatal hernia. 5. Colonic diverticulosis. 6. Stable enlarged right iliac chain lymph no, unchanged from 2015. Aortic Atherosclerosis (ICD10-I70.0). Electronically Signed  By: Ronney Asters M.D.   On: 04/26/2022 22:24    Procedures Procedures    Medications Ordered in ED Medications  acetaminophen (TYLENOL) tablet 1,000 mg (1,000 mg Oral Given 04/26/22 2237)  ibuprofen (ADVIL) tablet 800 mg (800 mg Oral Given 04/26/22 2337)    ED Course/ Medical Decision Making/ A&P                           Medical Decision Making Amount and/or Complexity of Data Reviewed Labs: ordered. Radiology: ordered.  Risk OTC drugs. Prescription drug management.  This patient presents to the ED for concern of flank pain, this involves an extensive number of treatment options, and is a complaint that carries with it a high risk of complications and morbidity.  The differential diagnosis of emergent flank pain includes, but is not limited to :Abdominal aortic aneurysm,, Renal artery embolism,Renal vein thrombosis, Aortic dissection, Mesenteric ischemia, Pyelonephritis, Renal infarction, Renal hemorrhage,  Nephrolithiasis/ Renal Colic, Bladder tumor,Cystitis, Biliary colic, Pancreatitis Perforated peptic ulcer Appendicitis ,Inguinal Hernia, Diverticulitis, Bowel obstruction Ectopic Pregnancy,PID/TOA,Ovarian cyst, Ovarian torsion Shingles Lower lobe pneumonia, Retroperitoneal hematoma/abscess/tumor, Epidural abscess, Epidural hematoma      Co morbidities that complicate the patient evaluation    hypertension, prediabetes, hyperlipidemia, history of renal stones   My initial workup includes basic labs, urinalysis, CT stone study, pain control   Additional history obtained from: Nursing notes from this visit.   I ordered, reviewed and interpreted labs which include: BMP, CBC, urinalysis.  All labs unremarkable     I ordered imaging studies including CT stone study I independently visualized and interpreted imaging which showed cholelithiasis, focal fatty infiltration of the liver adjacent to the gallbladder.  In the absence of right upper quadrant abdominal pain, will refer patient to GI.  No emergent intra-abdominal abnormalities visualized I agree with the radiologist interpretation   Afebrile, hemodynamically stable.  71 year old female presenting to the ED for evaluation of right-sided flank pain.  This pain is reproducible with forward flexion and twisting.  It is improved with Tylenol.  She has had no fevers, urinary symptoms including hematuria, right upper quadrant abdominal pain, or pelvic pain.  She did have an episode of generalized abdominal pain, however this did resolve after passing gas.  Lab workup unremarkable.  CT showed cholelithiasis and focal fatty infiltration of the liver adjacent to the gallbladder.  Patient is not complaining of right upper quadrant abdominal pain, nor is she tender in this area.  She has not had any fevers, nausea or vomiting.  Will give patient contact information for gastroenterology and encouraged her to follow-up regarding this.  Patient's symptoms are  most likely musculoskeletal.  Will give her a prescription for Robaxin.  Gave patient strict return precautions.  Stable at discharge.   At this time there does not appear to be any evidence of an acute emergency medical condition and the patient appears stable for discharge with appropriate outpatient follow up. Diagnosis was discussed with patient who verbalizes understanding of care plan and is agreeable to discharge. I have discussed return precautions with patient who verbalizes understanding. Patient encouraged to follow-up with their PCP within 1 week. All questions answered.   Patient's case discussed with Dr. Rogene Houston who agrees with plan to discharge with follow-up.    Note: Portions of this report may have been transcribed using voice recognition software. Every effort was made to ensure accuracy; however, inadvertent computerized transcription errors may still be present.  Final Clinical Impression(s) / ED Diagnoses Final diagnoses:  Flank pain    Rx / DC Orders ED Discharge Orders          Ordered    methocarbamol (ROBAXIN) 500 MG tablet  2 times daily        04/26/22 2321              Roylene Reason, Hershal Coria 04/26/22 2342    Fredia Sorrow, MD 04/27/22 [phone]

## 2022-04-27 ENCOUNTER — Ambulatory Visit (INDEPENDENT_AMBULATORY_CARE_PROVIDER_SITE_OTHER): Payer: PPO | Admitting: Family Medicine

## 2022-04-27 ENCOUNTER — Other Ambulatory Visit: Payer: Self-pay | Admitting: *Deleted

## 2022-04-27 ENCOUNTER — Telehealth: Payer: Self-pay | Admitting: Family Medicine

## 2022-04-27 VITALS — BP 122/79 | Ht 66.0 in | Wt 229.2 lb

## 2022-04-27 DIAGNOSIS — R109 Unspecified abdominal pain: Secondary | ICD-10-CM

## 2022-04-27 DIAGNOSIS — M546 Pain in thoracic spine: Secondary | ICD-10-CM | POA: Diagnosis not present

## 2022-04-27 DIAGNOSIS — R10A0 Flank pain, unspecified side: Secondary | ICD-10-CM

## 2022-04-27 DIAGNOSIS — K8020 Calculus of gallbladder without cholecystitis without obstruction: Secondary | ICD-10-CM

## 2022-04-27 NOTE — Telephone Encounter (Signed)
First of all it would be very difficult for me to know for certain if this is gallstones or not because I did not see her.   So it should be noted that she was in the ER for right side discomfort but the ER doctor stated on their examination she did not have right upper quadrant tenderness.  Her CAT scan showed gallstones but it is not known if that is the source of her pain or not.  If there is an opening today I can see her for follow-up evaluation and discussion

## 2022-04-27 NOTE — Telephone Encounter (Signed)
Pt called in and states she was in the ER for gall stones. Pt states she is in a lot pain. ER called in Methocarbamol 500 mg. Pt is needing referral to someone regarding gall stones. Please advise. Thank you.

## 2022-04-27 NOTE — Telephone Encounter (Signed)
Patient stated se is still having pain and the ER gave her muscle relaxers but she has not taken any yet. Patient given an appointment with Dr Nicki Reaper today at 2pm per provider

## 2022-04-27 NOTE — Progress Notes (Signed)
   Subjective:    Patient ID: Patricia Fuller, female    DOB: 12-27-1950, 71 y.o.   MRN: [id number]  HPI  Patient arrives for a follow up on ER visit for flank pain. Patient states the pain is in the right side of her back and hurts with movement. Patient states she was told she had gallstones at ER Patient have significant pain in the right mid back she states this happened about 2 weeks ago hurts with certain rotations movements standing up she denies any pain when she is lying still no vomiting or diarrhea.  Energy level okay no fever chills no dysuria She was in the ER CAT scan was reviewed with the patient Review of Systems     Objective:   Physical Exam General-in no acute distress Eyes-no discharge Lungs-respiratory rate normal, CTA CV-no murmurs,RRR Extremities skin warm dry no edema Neuro grossly normal Behavior normal, alert Abdomen is soft there is no tenderness or masses There is flank tenderness to palpation and increased pain in the same region with movement there is no rash       Assessment & Plan:  Musculoskeletal back pain-stretches were shown warm compresses should gradually resolve over the next couple weeks if not improving by next week recommend physical therapy follow-up within 2 to 3 weeks if ongoing troubles  Gallstones currently asymptomatic but it is possible that some of her side pain is related to gall bladder issues recommend HIDA test.  Also gave prescription for valacyclovir to cover for the possibility of shingles hold on taking this unless we see a rash picture of shingles shown to the patient  Follow-up in 2 to 3 weeks

## 2022-05-02 ENCOUNTER — Telehealth: Payer: Self-pay

## 2022-05-02 NOTE — Telephone Encounter (Signed)
Caller name: [name]  On DPR?: Yes  Call back number: [phone] (mobile)  Provider they see: Kathyrn Drown, MD  Reason for call:Pt is calling about a referral that was sent for scan she come in last week and Dr Nicki Reaper said he was going to request. I see radiology referral in there bust not sure if this is what she was looking for.

## 2022-05-02 NOTE — Addendum Note (Signed)
Addended by: [name] on: 05/02/2022 10:43 AM   Modules accepted: Orders

## 2022-05-02 NOTE — Progress Notes (Signed)
05/02/22-HIDA scan ordered

## 2022-05-03 NOTE — Telephone Encounter (Signed)
Patient called back to check on referral

## 2022-05-03 NOTE — Telephone Encounter (Signed)
Scan is scheduled for 05/10/22'@9'$  AM at [facility]. Pt is aware  Pt states she has been taking Ibuprofen but it has not been helping. Pt would like something a little stronger. Please advise. Thank you.

## 2022-05-03 NOTE — Telephone Encounter (Signed)
So when I saw her.  I recommended a HIDA test.  This is what needs to get scheduled.  For some reason and still not completed.  Please get this scheduled and notify patient thank you-make sure patient is kept in the loop what is going on thank you

## 2022-05-04 NOTE — Telephone Encounter (Signed)
Please talk with her see where she is hurting how bad her trouble is are there any other associated symptoms?    So there are not any great options 1 option would be low-dose pain medication such as tramadol-this typically can help with pain but a small number of people it can cause nausea if she is interested we could send some of this in

## 2022-05-04 NOTE — Telephone Encounter (Signed)
Pt contacted. Travels from back to right side of rib cage. Has to hold back when she gets up and using heating pad. Pt is OK with Tramadol being sent to [facility]. Please advise. Thank you

## 2022-05-05 ENCOUNTER — Other Ambulatory Visit: Payer: Self-pay | Admitting: Family Medicine

## 2022-05-05 MED ORDER — TRAMADOL HCL 50 MG PO TABS: 50.0000 mg | ORAL_TABLET | Freq: Three times a day (TID) | ORAL | 0 refills | Status: AC | PRN

## 2022-05-05 NOTE — Telephone Encounter (Signed)
Tramadol was sent in use sparingly home use only caution drowsiness

## 2022-05-10 ENCOUNTER — Encounter (HOSPITAL_COMMUNITY): Admission: RE | Admit: 2022-05-10 | Payer: PPO | Source: Ambulatory Visit

## 2022-05-11 ENCOUNTER — Ambulatory Visit (INDEPENDENT_AMBULATORY_CARE_PROVIDER_SITE_OTHER): Payer: PPO | Admitting: Family Medicine

## 2022-05-11 ENCOUNTER — Encounter: Payer: Self-pay | Admitting: Family Medicine

## 2022-05-11 VITALS — BP 136/82 | HR 67 | Temp 98.1°F | Wt 232.2 lb

## 2022-05-11 DIAGNOSIS — R109 Unspecified abdominal pain: Secondary | ICD-10-CM | POA: Diagnosis not present

## 2022-05-11 NOTE — Progress Notes (Signed)
   Subjective:    Patient ID: Patricia Fuller, female    DOB: 10-Feb-1951, 71 y.o.   MRN: [id number]  HPI Patient arrives today for two week follow up. Patient states she is still having pain. Patient relates pain is not as bad Pain waxes and wanes. At times the pain is severe and radiates from the right flank around her side at times it comes up into the right upper quadrant She does have gallstones She has a HIDA test coming up next week She does state that rolling over in bed and sometimes sitting up in bed will cause pain She denies vomiting sweats chills   Review of Systems     Objective:   Physical Exam  Lungs clear hearts regular subjective discomfort in the right flank region.  Some tenderness along the side there.  No rash.      Assessment & Plan:  Gallstones HIDA pending Await the results of the test It is possible also that this could be a musculoskeletal back issue that is causing impingement of the nerve if she does not get better on her own and if the HIDA test is negative then further evaluation of the thoracic spine with imaging and MRI.

## 2022-05-11 NOTE — Patient Instructions (Signed)
I will call you next week after I hear the results of your tests [facility]-Dr. Nicki Reaper

## 2022-05-16 ENCOUNTER — Encounter (HOSPITAL_COMMUNITY)
Admission: RE | Admit: 2022-05-16 | Discharge: 2022-05-16 | Disposition: A | Discharge status: Home / Self Care | Payer: PPO | Source: Ambulatory Visit | Attending: Family Medicine | Admitting: Family Medicine

## 2022-05-16 DIAGNOSIS — R109 Unspecified abdominal pain: Secondary | ICD-10-CM | POA: Diagnosis not present

## 2022-05-16 DIAGNOSIS — K8020 Calculus of gallbladder without cholecystitis without obstruction: Secondary | ICD-10-CM | POA: Diagnosis not present

## 2022-05-16 MED ORDER — TECHNETIUM TO 99M ALBUMIN AGGREGATED: 5.1000 | Freq: Once | INTRAVENOUS | Status: DC | PRN

## 2022-05-16 MED ORDER — TECHNETIUM TC 99M MEBROFENIN IV KIT
5.1000 | PACK | Freq: Once | INTRAVENOUS | Status: AC | PRN
  Administered 2022-05-16: 5.1 via INTRAVENOUS

## 2022-05-16 NOTE — Addendum Note (Signed)
Addended by: Dairl Ponder on: 05/16/2022 02:58 PM   Modules accepted: Orders

## 2022-05-17 ENCOUNTER — Ambulatory Visit (HOSPITAL_COMMUNITY)
Admission: RE | Admit: 2022-05-17 | Discharge: 2022-05-17 | Disposition: A | Discharge status: Home / Self Care | Payer: PPO | Source: Ambulatory Visit | Attending: Family Medicine | Admitting: Family Medicine

## 2022-05-17 DIAGNOSIS — M546 Pain in thoracic spine: Secondary | ICD-10-CM | POA: Insufficient documentation

## 2022-05-17 DIAGNOSIS — R109 Unspecified abdominal pain: Secondary | ICD-10-CM | POA: Diagnosis not present

## 2022-05-18 ENCOUNTER — Encounter: Payer: Self-pay | Admitting: Family Medicine

## 2022-05-18 DIAGNOSIS — I700 Atherosclerosis of aorta: Secondary | ICD-10-CM | POA: Insufficient documentation

## 2022-05-20 ENCOUNTER — Other Ambulatory Visit: Payer: Self-pay | Admitting: Family Medicine

## 2022-05-20 ENCOUNTER — Telehealth: Payer: Self-pay | Admitting: Family Medicine

## 2022-05-20 DIAGNOSIS — Z780 Asymptomatic menopausal state: Secondary | ICD-10-CM

## 2022-05-20 NOTE — Telephone Encounter (Signed)
Pt contacted and informed results. Pt verbalized understanding.

## 2022-05-20 NOTE — Telephone Encounter (Signed)
Patient would like results of recent test done 05/16/22

## 2022-05-23 ENCOUNTER — Ambulatory Visit (HOSPITAL_COMMUNITY)
Admission: RE | Admit: 2022-05-23 | Discharge: 2022-05-23 | Disposition: A | Discharge status: Home / Self Care | Payer: PPO | Source: Ambulatory Visit | Attending: Family Medicine | Admitting: Family Medicine

## 2022-05-23 DIAGNOSIS — Z780 Asymptomatic menopausal state: Secondary | ICD-10-CM | POA: Diagnosis not present

## 2022-05-23 DIAGNOSIS — M810 Age-related osteoporosis without current pathological fracture: Secondary | ICD-10-CM | POA: Diagnosis not present

## 2022-06-16 ENCOUNTER — Other Ambulatory Visit: Payer: Self-pay

## 2022-06-16 DIAGNOSIS — R739 Hyperglycemia, unspecified: Secondary | ICD-10-CM

## 2022-06-16 DIAGNOSIS — I10 Essential (primary) hypertension: Secondary | ICD-10-CM | POA: Diagnosis not present

## 2022-06-16 DIAGNOSIS — R7303 Prediabetes: Secondary | ICD-10-CM

## 2022-06-16 DIAGNOSIS — E785 Hyperlipidemia, unspecified: Secondary | ICD-10-CM | POA: Diagnosis not present

## 2022-06-16 DIAGNOSIS — E1169 Type 2 diabetes mellitus with other specified complication: Secondary | ICD-10-CM

## 2022-06-17 ENCOUNTER — Ambulatory Visit (INDEPENDENT_AMBULATORY_CARE_PROVIDER_SITE_OTHER): Payer: PPO | Admitting: Family Medicine

## 2022-06-17 ENCOUNTER — Encounter: Payer: Self-pay | Admitting: Family Medicine

## 2022-06-17 ENCOUNTER — Ambulatory Visit: Payer: PPO | Admitting: Family Medicine

## 2022-06-17 VITALS — BP 119/74 | Wt 227.8 lb

## 2022-06-17 DIAGNOSIS — E119 Type 2 diabetes mellitus without complications: Secondary | ICD-10-CM | POA: Insufficient documentation

## 2022-06-17 DIAGNOSIS — E1169 Type 2 diabetes mellitus with other specified complication: Secondary | ICD-10-CM

## 2022-06-17 DIAGNOSIS — M816 Localized osteoporosis [Lequesne]: Secondary | ICD-10-CM

## 2022-06-17 DIAGNOSIS — I700 Atherosclerosis of aorta: Secondary | ICD-10-CM | POA: Diagnosis not present

## 2022-06-17 DIAGNOSIS — I10 Essential (primary) hypertension: Secondary | ICD-10-CM

## 2022-06-17 DIAGNOSIS — E785 Hyperlipidemia, unspecified: Secondary | ICD-10-CM | POA: Diagnosis not present

## 2022-06-17 LAB — BASIC METABOLIC PANEL
BUN/Creatinine Ratio: 17 (ref 12–28)
BUN: 15 mg/dL (ref 8–27)
CO2: 21 mmol/L (ref 20–29)
Calcium: 10.2 mg/dL (ref 8.7–10.3)
Chloride: 104 mmol/L (ref 96–106)
Creatinine, Ser: 0.86 mg/dL (ref 0.57–1.00)
Glucose: 121 mg/dL — ABNORMAL HIGH (ref 70–99)
Potassium: 4.9 mmol/L (ref 3.5–5.2)
Sodium: 142 mmol/L (ref 134–144)
eGFR: 73 mL/min/{1.73_m2} (ref >59)

## 2022-06-17 LAB — HEMOGLOBIN A1C
Est. average glucose Bld gHb Est-mCnc: 137 mg/dL
Hgb A1c MFr Bld: 6.4 % — ABNORMAL HIGH (ref 4.8–5.6)

## 2022-06-17 LAB — LIPID PANEL
Chol/HDL Ratio: 2.6 ratio (ref 0.0–4.4)
Cholesterol, Total: 153 mg/dL (ref 100–199)
HDL: 60 mg/dL (ref >39)
LDL Chol Calc (NIH): 66 mg/dL (ref 0–99)
Triglycerides: 160 mg/dL — ABNORMAL HIGH (ref 0–149)
VLDL Cholesterol Cal: 27 mg/dL (ref 5–40)

## 2022-06-17 MED ORDER — ALENDRONATE SODIUM 70 MG PO TABS: 70.0000 mg | ORAL_TABLET | ORAL | 11 refills | Status: DC

## 2022-06-17 MED ORDER — ROSUVASTATIN CALCIUM 10 MG PO TABS: 10.0000 mg | ORAL_TABLET | Freq: Every day | ORAL | 1 refills | Status: DC

## 2022-06-17 MED ORDER — LOSARTAN POTASSIUM 50 MG PO TABS: 50.0000 mg | ORAL_TABLET | Freq: Every morning | ORAL | 1 refills | Status: DC

## 2022-06-17 MED ORDER — METFORMIN HCL 500 MG PO TABS: 250.0000 mg | ORAL_TABLET | Freq: Two times a day (BID) | ORAL | 1 refills | Status: DC

## 2022-06-17 NOTE — Patient Instructions (Signed)
Alendronate Solution What is this medication? ALENDRONATE (a LEN droe nate) treats osteoporosis. It works by Paramedic stronger and less likely to break (fracture). It belongs to a group of medications called bisphosphonates. This medicine may be used for other purposes; ask your health care provider or pharmacist if you have questions. COMMON BRAND NAME(S): Fosamax What should I tell my care team before I take this medication? They need to know if you have any of these conditions: Bleeding disorder Cancer Dental disease Difficulty swallowing Infection (fever, chills, cough, sore throat, pain or trouble passing urine) Kidney disease Low levels of calcium or other minerals in the blood Low red blood cell counts Receiving steroids like dexamethasone or prednisone Stomach or intestine problems Trouble sitting or standing for 30 minutes An unusual or allergic reaction to alendronate, other medications, foods, dyes or preservatives Pregnant or trying to get pregnant Breast-feeding How should I use this medication? Take this medication by mouth with a full glass of water. Take it as directed on the prescription label at the same time every day. Use a specially marked oral syringe, spoon, or dropper to measure each dose. Ask your pharmacist if you do not have one. Household spoons are not accurate. Take the dose right after waking up. Do not eat or drink anything before taking it. Do not take it with any other drink except water. After taking it, do not eat breakfast, drink, or take any other medications or vitamins for at least 30 minutes. Sit or stand up for at least 30 minutes after you take it. Do not lie down. Keep taking it unless your care team tells you to stop. A special MedGuide will be given to you by the pharmacist with each prescription and refill. Be sure to read this information carefully each time. Talk to your care team about the use of this medication in children. Special  care may be needed. Overdosage: If you think you have taken too much of this medicine contact a poison control center or emergency room at once. NOTE: This medicine is only for you. Do not share this medicine with others. What if I miss a dose? If you take your medication once a day, skip it. Take your next dose at the scheduled time the next morning. Do not take two doses on the same day. If you take your medication once a week, take the missed dose on the morning after you remember. Do not take two doses on the same day. What may interact with this medication? Aluminum hydroxide Antacids Aspirin Calcium supplements Iron supplements Magnesium supplements Medications for inflammation like ibuprofen, naproxen, and others Vitamins with minerals This list may not describe all possible interactions. Give your health care provider a list of all the medicines, herbs, non-prescription drugs, or dietary supplements you use. Also tell them if you smoke, drink alcohol, or use illegal drugs. Some items may interact with your medicine. What should I watch for while using this medication? Visit your care team for regular checks on your progress. It may be some time before you see the benefit from this medication. Some people who take this medication have severe bone, joint, or muscle pain. This medication may also increase your risk for jaw problems or a broken thigh bone. Tell your care team right away if you have severe pain in your jaw, bones, joints, or muscles. Tell you care team if you have any pain that does not go away or that gets worse. Tell your dentist  and dental surgeon that you are taking this medication. You should not have major dental surgery while on this medication. See your dentist to have a dental exam and fix any dental problems before starting this medication. Take good care of your teeth while on this medication. Make sure you see your dentist for regular follow-up appointments. You  should make sure you get enough calcium and vitamin D while you are taking this medication. Discuss the foods you eat and the vitamins you take with your care team. You may need blood work done while you are taking this medication. What side effects may I notice from receiving this medication? Side effects that you should report to your care team as soon as possible: Allergic reactions--skin rash, itching, hives, swelling of the face, lips, tongue, or throat Low calcium level--muscle pain or cramps, confusion, tingling, or numbness in the hands or feet Osteonecrosis of the jaw--pain, swelling, or redness in the mouth, numbness of the jaw, poor healing after dental work, unusual discharge from the mouth, visible bones in the mouth Pain or trouble swallowing Severe bone, joint, or muscle pain Stomach bleeding--bloody or black, tar-like stools, vomiting blood or brown material that looks like coffee grounds Side effects that usually do not require medical attention (report to your care team if they continue or are bothersome): Constipation Diarrhea Nausea Stomach pain This list may not describe all possible side effects. Call your doctor for medical advice about side effects. You may report side effects to FDA at 1-800-FDA-1088. Where should I keep my medication? Keep out of the reach of children and pets. Store at room temperature between 20 and 25 degrees C (68 and 77 degrees F). Do not freeze. Throw away any unused medication after the expiration date. NOTE: This sheet is a summary. It may not cover all possible information. If you have questions about this medicine, talk to your doctor, pharmacist, or health care provider.  2023 Elsevier/Gold Standard (2020-05-21 00:00:00)

## 2022-06-17 NOTE — Progress Notes (Signed)
   Subjective:    Patient ID: Patricia Fuller, female    DOB: 02-14-1951, 72 y.o.   MRN: [id number]  Diabetes She presents for her follow-up diabetic visit. She has type 2 diabetes mellitus. There are no hypoglycemic associated symptoms. There are no diabetic associated symptoms. There are no hypoglycemic complications. There are no diabetic complications. Risk factors for coronary artery disease include hypertension. She does not see a podiatrist.Eye exam is current (appt in March).   Blood pressure doing ok per pt.    Review of Systems     Objective:   Physical Exam General-in no acute distress Eyes-no discharge Lungs-respiratory rate normal, CTA CV-no murmurs,RRR Extremities skin warm dry no edema Neuro grossly normal Behavior normal, alert        Assessment & Plan:  1. Hyperlipidemia associated with type 2 diabetes mellitus (HCC) Hyperlipidemia-importance of diet, weight control, activity, compliance with medications discussed.   Recent labs reviewed.   Any additional labs or refills ordered.   Importance of keeping under good control discussed. Regular follow-up visits discussed   2. Aortic atherosclerosis (HCC) Lipid control blood pressure control healthy diet keeping sugar under good control is the best approach  3. Essential hypertension, benign HTN- patient seen for follow-up regarding HTN.   Diet, medication compliance, appropriate labs and refills were completed.   Importance of keeping blood pressure under good control to lessen the risk of complications discussed Regular follow-up visits discussed   4. Diabetes mellitus without complication ([place]) The patient was seen today as part of a comprehensive visit for diabetes. The importance of keeping her A1c at or below 7 range was discussed.  Discussed diet, activity, and medication compliance Emphasized healthy eating primarily with vegetables fruits and if utilizing meats lean meats such as chicken or fish  grilled baked broiled Avoid sugary drinks Minimize and avoid processed foods Fit in regular physical activity preferably 25 to 30 minutes 4 times per week Standard follow-up visit recommended.  Patient aware lack of control and follow-up increases risk of diabetic complications. Regular follow-up visits Yearly ophthalmology Yearly foot exam   5. Localized osteoporosis without current pathological fracture Fosamax Side effects discussed Start medication Monitor closely

## 2022-07-15 ENCOUNTER — Ambulatory Visit: Payer: PPO | Admitting: Family Medicine

## 2022-08-08 ENCOUNTER — Other Ambulatory Visit: Payer: Self-pay | Admitting: Family Medicine

## 2022-08-08 DIAGNOSIS — Z1231 Encounter for screening mammogram for malignant neoplasm of breast: Secondary | ICD-10-CM

## 2022-08-11 NOTE — Progress Notes (Signed)
Subjective:   Patricia Fuller is a 72 y.o. female who presents for Medicare Annual (Subsequent) preventive examination.  I connected with  Patricia Fuller on 08/12/22 by a audio enabled telemedicine application and verified that I am speaking with the correct person using two identifiers.  Patient Location: Home  Provider Location: Office/Clinic  I discussed the limitations of evaluation and management by telemedicine. The patient expressed understanding and agreed to proceed.  Review of Systems     Cardiac Risk Factors include: diabetes mellitus;advanced age (>14mn, >>30women);hypertension;dyslipidemia     Objective:    Today's Vitals   08/12/22 0907  Weight: 227 lb (103 kg)  Height: '5\' 6"'$  (1.676 m)   Body mass index is 36.64 kg/m.     08/12/2022    9:17 AM 04/26/2022    6:58 PM 08/03/2021    8:24 AM 10/19/2020    7:28 AM 07/19/2020    6:14 PM 07/16/2014    9:47 AM 01/13/2014    2:15 PM  Advanced Directives  Does Patient Have a Medical Advance Directive? No No No No No No Patient does not have advance directive;Patient would not like information  Does patient want to make changes to medical advance directive? No - Patient declined        Would patient like information on creating a medical advance directive?  No - Patient declined No - Patient declined Yes (MAU/Ambulatory/Procedural Areas - Information given) No - Patient declined No - patient declined information   Pre-existing out of facility DNR order (yellow form or pink MOST form)       No    Current Medications (verified) Outpatient Encounter Medications as of 08/12/2022  Medication Sig   alendronate (FOSAMAX) 70 MG tablet Take 1 tablet (70 mg total) by mouth every 7 (seven) days. Take with a full glass of water on an empty stomach.   cetirizine (ZYRTEC) 10 MG tablet Take 10 mg by mouth daily as needed for allergies.   losartan (COZAAR) 50 MG tablet Take 1 tablet (50 mg total) by mouth every morning.   metFORMIN  (GLUCOPHAGE) 500 MG tablet Take 0.5 tablets (250 mg total) by mouth 2 (two) times daily with a meal.   rosuvastatin (CRESTOR) 10 MG tablet Take 1 tablet (10 mg total) by mouth daily.   traMADol (ULTRAM) 50 MG tablet Take 50 mg by mouth every 8 (eight) hours. Take 1 tablet by mouth every 8 hours as needed for up to 5 days   No facility-administered encounter medications on file as of 08/12/2022.    Allergies (verified) Patient has no known allergies.   History: Past Medical History:  Diagnosis Date   Hyperlipidemia    Hypertension    Prediabetes    Renal calculi    Past Surgical History:  Procedure Laterality Date   BIOPSY  10/19/2020   Procedure: BIOPSY;  Surgeon: CEloise Harman DO;  [facility]: [facility];  Service: Endoscopy;;  transverse colon polyp   COLONOSCOPY WITH PROPOFOL N/A 10/19/2020   Procedure: COLONOSCOPY WITH PROPOFOL;  Surgeon: CEloise Harman DO;  Location: [facility];  Service: Endoscopy;  Laterality: N/A;  am appt   LITHOTRIPSY     POLYPECTOMY  10/19/2020   Procedure: POLYPECTOMY;  Surgeon: CEloise Harman DO;  Location: [facility];  Service: Endoscopy;;  transverse colon polyp, descending colon polyp, sigmoid colon polyp    TUBAL LIGATION     Family History  Problem Relation Age of Onset  Hypertension Mother    Diabetes Mother    Heart disease Mother    Colon cancer Neg Hx    Social History   Socioeconomic History   Marital status: Single    Spouse name: Not on file   Number of children: Not on file   Years of education: Not on file   Highest education level: Not on file  Occupational History   Not on file  Tobacco Use   Smoking status: Never   Smokeless tobacco: Never  Vaping Use   Vaping Use: Never used  Substance and Sexual Activity   Alcohol use: No   Drug use: No   Sexual activity: Never  Other Topics Concern   Not on file  Social History Narrative   Not on file   Social Determinants of Health   Financial Resource  Strain: Low Risk  (08/12/2022)   Overall Financial Resource Strain (CARDIA)    Difficulty of Paying Living Expenses: Not hard at all  Food Insecurity: No Food Insecurity (08/12/2022)   Hunger Vital Sign    Worried About Running Out of Food in the Last Year: Never true    [place] in the Last Year: Never true  Transportation Needs: No Transportation Needs (08/12/2022)   PRAPARE - Hydrologist (Medical): No    Lack of Transportation (Non-Medical): No  Physical Activity: Insufficiently Active (08/12/2022)   Exercise Vital Sign    Days of Exercise per Week: 3 days    Minutes of Exercise per Session: 30 min  Stress: No Stress Concern Present (08/12/2022)   [place]    Feeling of Stress : Not at all  Social Connections: Socially Isolated (08/12/2022)   Social Connection and Isolation Panel [NHANES]    Frequency of Communication with Friends and Family: More than three times a week    Frequency of Social Gatherings with Friends and Family: More than three times a week    Attends Religious Services: Never    Marine scientist or Organizations: No    Attends Music therapist: Never    Marital Status: Divorced    Tobacco Counseling Counseling given: Not Answered   Clinical Intake:  Pre-visit preparation completed: Yes  Pain : No/denies pain  Diabetes: Yes CBG done?: No Did pt. bring in CBG monitor from home?: No  How often do you need to have someone help you when you read instructions, pamphlets, or other written materials from your doctor or pharmacy?: 1 - Never  Diabetic?Yes  Nutrition Risk Assessment:  Has the patient had any N/V/D within the last 2 months?  No  Does the patient have any non-healing wounds?  No  Has the patient had any unintentional weight loss or weight gain?  No   Diabetes:  Is the patient diabetic?  Yes  If diabetic, was a CBG obtained  today?  No  Did the patient bring in their glucometer from home?  No  How often do you monitor your CBG's? As needed .   Financial Strains and Diabetes Management:  Are you having any financial strains with the device, your supplies or your medication? No .  Does the patient want to be seen by Chronic Care Management for management of their diabetes?  No  Would the patient like to be referred to a Nutritionist or for Diabetic Management?  No   Diabetic Exams:  Diabetic Eye Exam: Overdue for diabetic  eye exam. Pt has been advised about the importance in completing this exam. Patient advised to call and schedule an eye exam. Diabetic Foot Exam: Overdue, Pt has been advised about the importance in completing this exam. Pt is scheduled for diabetic foot exam on at next office visit.   Interpreter Needed?: No  Information entered by :: Denman George LPN   Activities of Daily Living    08/12/2022    9:17 AM  In your present state of health, do you have any difficulty performing the following activities:  Hearing? 0  Vision? 0  Difficulty concentrating or making decisions? 0  Walking or climbing stairs? 0  Dressing or bathing? 0  Doing errands, shopping? 0  Preparing Food and eating ? N  Using the Toilet? N  In the past six months, have you accidently leaked urine? N  Do you have problems with loss of bowel control? N  Managing your Medications? N  Managing your Finances? N  Housekeeping or managing your Housekeeping? N    Patient Care Team: Kathyrn Drown, MD as PCP - General (Family Medicine) Eloise Harman, DO as Consulting Physician (Gastroenterology)  Indicate any recent Medical Services you may have received from other than [facility] providers in the past year (date may be approximate).     Assessment:   This is a routine wellness examination for [facility].  Hearing/Vision screen Hearing Screening - Comments:: Denies hearing difficulties  Vision Screening - Comments:: up  to date with routine eye exams with [facility] Dr. Linna Hoff    Dietary issues and exercise activities discussed: Current Exercise Habits: Home exercise routine, Type of exercise: walking, Time (Minutes): 30, Frequency (Times/Week): 3, Weekly Exercise (Minutes/Week): 90, Intensity: Mild   Goals Addressed             This Visit's Progress    Remain active and independent         Depression Screen    08/12/2022    9:31 AM 06/17/2022    8:36 AM 05/11/2022   10:26 AM 04/27/2022    2:09 PM 01/12/2022    1:51 PM 08/03/2021    8:22 AM 01/18/2021    1:11 PM  PHQ 2/9 Scores  PHQ - 2 Score 0 0 0 0 0 0 0    Fall Risk    08/12/2022    9:16 AM 06/17/2022    8:36 AM 05/11/2022   10:26 AM 04/27/2022    2:09 PM 01/12/2022    1:51 PM  Fall Risk   Falls in the past year? 0 0 0 0 0  Number falls in past yr: 0 0 0  0  Injury with Fall? 0 0 0  0  Risk for fall due to : No Fall Risks No Fall Risks  No Fall Risks No Fall Risks  Follow up Falls prevention discussed;Education provided;Falls evaluation completed Falls evaluation completed  Falls evaluation completed Falls evaluation completed    FALL RISK PREVENTION PERTAINING TO THE HOME:  Any stairs in or around the home? No  If so, are there any without handrails? No  Home free of loose throw rugs in walkways, pet beds, electrical cords, etc? Yes  Adequate lighting in your home to reduce risk of falls? Yes   ASSISTIVE DEVICES UTILIZED TO PREVENT FALLS:  Life alert? No  Use of a cane, walker or w/c? No  Grab bars in the bathroom? Yes  Shower chair or bench in shower? No  Elevated toilet seat or a handicapped toilet?  Yes   TIMED UP AND GO:  Was the test performed? No . Telephonic visit   Cognitive Function:        08/12/2022    9:17 AM 08/03/2021    8:26 AM  6CIT Screen  What Year? 0 points 0 points  What month? 0 points 0 points  What time? 0 points 0 points  Count back from 20 0 points 0 points  Months in reverse 0 points 0 points   Repeat phrase 0 points 2 points  Total Score 0 points 2 points    Immunizations  There is no immunization history on file for this patient.  TDAP status: Due, Education has been provided regarding the importance of this vaccine. Advised may receive this vaccine at local pharmacy or Health Dept. Aware to provide a copy of the vaccination record if obtained from local pharmacy or Health Dept. Verbalized acceptance and understanding.  Flu Vaccine status: Declined, Education has been provided regarding the importance of this vaccine but patient still declined. Advised may receive this vaccine at local pharmacy or Health Dept. Aware to provide a copy of the vaccination record if obtained from local pharmacy or Health Dept. Verbalized acceptance and understanding.  Pneumococcal vaccine status: Declined,  Education has been provided regarding the importance of this vaccine but patient still declined. Advised may receive this vaccine at local pharmacy or Health Dept. Aware to provide a copy of the vaccination record if obtained from local pharmacy or Health Dept. Verbalized acceptance and understanding.   Covid-19 vaccine status: Declined, Education has been provided regarding the importance of this vaccine but patient still declined. Advised may receive this vaccine at local pharmacy or Health Dept.or vaccine clinic. Aware to provide a copy of the vaccination record if obtained from local pharmacy or Health Dept. Verbalized acceptance and understanding.  Qualifies for Shingles Vaccine? Yes   Zostavax completed No   Shingrix Completed?: No.    Education has been provided regarding the importance of this vaccine. Patient has been advised to call insurance company to determine out of pocket expense if they have not yet received this vaccine. Advised may also receive vaccine at local pharmacy or Health Dept. Verbalized acceptance and understanding.  Screening Tests Health Maintenance  Topic Date Due    OPHTHALMOLOGY EXAM  Never done   MAMMOGRAM  Never done   FOOT EXAM  06/30/2022   Zoster Vaccines- Shingrix (1 of 2) 11/12/2022 (Originally 02/06/1970)   Pneumonia Vaccine 71+ Years old (1 of 1 - PCV) 08/12/2023 (Originally 02/05/2018)   HEMOGLOBIN A1C  12/15/2022   Diabetic kidney evaluation - Urine ACR  01/11/2023   Diabetic kidney evaluation - eGFR measurement  06/17/2023   Medicare Annual Wellness (AWV)  08/12/2023   COLONOSCOPY (Pts 45-69yr Insurance coverage will need to be confirmed)  10/20/2023   DEXA SCAN  Completed   Hepatitis C Screening  Completed   HPV VACCINES  Aged Out   DTaP/Tdap/Td  Discontinued   INFLUENZA VACCINE  Discontinued   COLON CANCER SCREENING ANNUAL FOBT  Discontinued   COVID-19 Vaccine  Discontinued    Health Maintenance  Health Maintenance Due  Topic Date Due   OPHTHALMOLOGY EXAM  Never done   MAMMOGRAM  Never done   FOOT EXAM  06/30/2022    Colorectal cancer screening: Type of screening: Colonoscopy. Completed 10/19/20. Repeat every 3 years  Mammogram status: Ordered 08/08/22. Pt provided with contact info and advised to call to schedule appt.   Bone Density status: Completed 05/23/22.  Results reflect: Bone density results: OSTEOPOROSIS. Repeat every 2 years.  Lung Cancer Screening: (Low Dose CT Chest recommended if Age 8-80 years, 30 pack-year currently smoking OR have quit w/in 15years.) does not qualify.   Lung Cancer Screening Referral: n/a  Additional Screening:  Hepatitis C Screening: does qualify; Completed 08/23/17  Vision Screening: Recommended annual ophthalmology exams for early detection of glaucoma and other disorders of the eye. Is the patient up to date with their annual eye exam?   scheduled within the next week Who is the provider or what is the name of the office in which the patient attends annual eye exams? [facility]. Linna Hoff  If pt is not established with a provider, would they like to be referred to a provider to establish  care? No .   Dental Screening: Recommended annual dental exams for proper oral hygiene  Community Resource Referral / Chronic Care Management: CRR required this visit?  No   CCM required this visit?  No      Plan:     I have personally reviewed and noted the following in the patient's chart:   Medical and social history Use of alcohol, tobacco or illicit drugs  Current medications and supplements including opioid prescriptions. Patient is not currently taking opioid prescriptions. Functional ability and status Nutritional status Physical activity Advanced directives List of other physicians Hospitalizations, surgeries, and ER visits in previous 12 months Vitals Screenings to include cognitive, depression, and falls Referrals and appointments  In addition, I have reviewed and discussed with patient certain preventive protocols, quality metrics, and best practice recommendations. A written personalized care plan for preventive services as well as general preventive health recommendations were provided to patient.     Vanetta Mulders, [place]   [id number]   Due to this being a virtual visit, the after visit summary with patients personalized plan was offered to patient via mail or my-chart.  per request, patient was mailed a copy of AVS  Nurse Notes: No concerns

## 2022-08-11 NOTE — Patient Instructions (Signed)
Patricia Fuller , Thank you for taking time to come for your Medicare Wellness Visit. I appreciate your ongoing commitment to your health goals. Please review the following plan we discussed and let me know if I can assist you in the future.   These are the goals we discussed:  Goals      Exercise 3x per week (30 min per time)     Continue exercise and eat helathy     Remain active and independent        This is a list of the screening recommended for you and due dates:  Health Maintenance  Topic Date Due   Eye exam for diabetics  Never done   Mammogram  Never done   Complete foot exam   06/30/2022   Zoster (Shingles) Vaccine (1 of 2) 11/12/2022*   Pneumonia Vaccine (1 of 1 - PCV) 08/12/2023*   Hemoglobin A1C  12/15/2022   Yearly kidney health urinalysis for diabetes  01/11/2023   Yearly kidney function blood test for diabetes  06/17/2023   Medicare Annual Wellness Visit  08/12/2023   Colon Cancer Screening  10/20/2023   DEXA scan (bone density measurement)  Completed   Hepatitis C Screening: USPSTF Recommendation to screen - Ages 38-79 yo.  Completed   HPV Vaccine  Aged Out   DTaP/Tdap/Td vaccine  Discontinued   Flu Shot  Discontinued   Stool Blood Test  Discontinued   COVID-19 Vaccine  Discontinued  *Topic was postponed. The date shown is not the original due date.    Advanced directives: Forms are available if you choose in the future to pursue completion.  This is recommended in order to make sure that your health wishes are honored in the event that you are unable to verbalize them to the provider.    Conditions/risks identified: Aim for 30 minutes of exercise or brisk walking, 6-8 glasses of water, and 5 servings of fruits and vegetables each day.   Next appointment: Follow up in one year for your annual wellness visit    Preventive Care 65 Years and Older, Female Preventive care refers to lifestyle choices and visits with your health care provider that can promote health  and wellness. What does preventive care include? A yearly physical exam. This is also called an annual well check. Dental exams once or twice a year. Routine eye exams. Ask your health care provider how often you should have your eyes checked. Personal lifestyle choices, including: Daily care of your teeth and gums. Regular physical activity. Eating a healthy diet. Avoiding tobacco and drug use. Limiting alcohol use. Practicing safe sex. Taking low-dose aspirin every day. Taking vitamin and mineral supplements as recommended by your health care provider. What happens during an annual well check? The services and screenings done by your health care provider during your annual well check will depend on your age, overall health, lifestyle risk factors, and family history of disease. Counseling  Your health care provider may ask you questions about your: Alcohol use. Tobacco use. Drug use. Emotional well-being. Home and relationship well-being. Sexual activity. Eating habits. History of falls. Memory and ability to understand (cognition). Work and work Statistician. Reproductive health. Screening  You may have the following tests or measurements: Height, weight, and BMI. Blood pressure. Lipid and cholesterol levels. These may be checked every 5 years, or more frequently if you are over 73 years old. Skin check. Lung cancer screening. You may have this screening every year starting at age 50 if you  have a 30-pack-year history of smoking and currently smoke or have quit within the past 15 years. Fecal occult blood test (FOBT) of the stool. You may have this test every year starting at age 4. Flexible sigmoidoscopy or colonoscopy. You may have a sigmoidoscopy every 5 years or a colonoscopy every 10 years starting at age 73. Hepatitis C blood test. Hepatitis B blood test. Sexually transmitted disease (STD) testing. Diabetes screening. This is done by checking your blood sugar  (glucose) after you have not eaten for a while (fasting). You may have this done every 1-3 years. Bone density scan. This is done to screen for osteoporosis. You may have this done starting at age 40. Mammogram. This may be done every 1-2 years. Talk to your health care provider about how often you should have regular mammograms. Talk with your health care provider about your test results, treatment options, and if necessary, the need for more tests. Vaccines  Your health care provider may recommend certain vaccines, such as: Influenza vaccine. This is recommended every year. Tetanus, diphtheria, and acellular pertussis (Tdap, Td) vaccine. You may need a Td booster every 10 years. Zoster vaccine. You may need this after age 66. Pneumococcal 13-valent conjugate (PCV13) vaccine. One dose is recommended after age 53. Pneumococcal polysaccharide (PPSV23) vaccine. One dose is recommended after age 60. Talk to your health care provider about which screenings and vaccines you need and how often you need them. This information is not intended to replace advice given to you by your health care provider. Make sure you discuss any questions you have with your health care provider. Document Released: 06/19/2015 Document Revised: 02/10/2016 Document Reviewed: 03/24/2015 Elsevier Interactive Patient Education  2017 [place] Prevention in the Home Falls can cause injuries. They can happen to people of all ages. There are many things you can do to make your home safe and to help prevent falls. What can I do on the outside of my home? Regularly fix the edges of walkways and driveways and fix any cracks. Remove anything that might make you trip as you walk through a door, such as a raised step or threshold. Trim any bushes or trees on the path to your home. Use bright outdoor lighting. Clear any walking paths of anything that might make someone trip, such as rocks or tools. Regularly check to see  if handrails are loose or broken. Make sure that both sides of any steps have handrails. Any raised decks and porches should have guardrails on the edges. Have any leaves, snow, or ice cleared regularly. Use sand or salt on walking paths during winter. Clean up any spills in your garage right away. This includes oil or grease spills. What can I do in the bathroom? Use night lights. Install grab bars by the toilet and in the tub and shower. Do not use towel bars as grab bars. Use non-skid mats or decals in the tub or shower. If you need to sit down in the shower, use a plastic, non-slip stool. Keep the floor dry. Clean up any water that spills on the floor as soon as it happens. Remove soap buildup in the tub or shower regularly. Attach bath mats securely with double-sided non-slip rug tape. Do not have throw rugs and other things on the floor that can make you trip. What can I do in the bedroom? Use night lights. Make sure that you have a light by your bed that is easy to reach. Do not use  any sheets or blankets that are too big for your bed. They should not hang down onto the floor. Have a firm chair that has side arms. You can use this for support while you get dressed. Do not have throw rugs and other things on the floor that can make you trip. What can I do in the kitchen? Clean up any spills right away. Avoid walking on wet floors. Keep items that you use a lot in easy-to-reach places. If you need to reach something above you, use a strong step stool that has a grab bar. Keep electrical cords out of the way. Do not use floor polish or wax that makes floors slippery. If you must use wax, use non-skid floor wax. Do not have throw rugs and other things on the floor that can make you trip. What can I do with my stairs? Do not leave any items on the stairs. Make sure that there are handrails on both sides of the stairs and use them. Fix handrails that are broken or loose. Make sure that  handrails are as long as the stairways. Check any carpeting to make sure that it is firmly attached to the stairs. Fix any carpet that is loose or worn. Avoid having throw rugs at the top or bottom of the stairs. If you do have throw rugs, attach them to the floor with carpet tape. Make sure that you have a light switch at the top of the stairs and the bottom of the stairs. If you do not have them, ask someone to add them for you. What else can I do to help prevent falls? Wear shoes that: Do not have high heels. Have rubber bottoms. Are comfortable and fit you well. Are closed at the toe. Do not wear sandals. If you use a stepladder: Make sure that it is fully opened. Do not climb a closed stepladder. Make sure that both sides of the stepladder are locked into place. Ask someone to hold it for you, if possible. Clearly mark and make sure that you can see: Any grab bars or handrails. First and last steps. Where the edge of each step is. Use tools that help you move around (mobility aids) if they are needed. These include: Canes. Walkers. Scooters. Crutches. Turn on the lights when you go into a dark area. Replace any light bulbs as soon as they burn out. Set up your furniture so you have a clear path. Avoid moving your furniture around. If any of your floors are uneven, fix them. If there are any pets around you, be aware of where they are. Review your medicines with your doctor. Some medicines can make you feel dizzy. This can increase your chance of falling. Ask your doctor what other things that you can do to help prevent falls. This information is not intended to replace advice given to you by your health care provider. Make sure you discuss any questions you have with your health care provider. Document Released: 03/19/2009 Document Revised: 10/29/2015 Document Reviewed: 06/27/2014 Elsevier Interactive Patient Education  2017 [employer].

## 2022-08-12 ENCOUNTER — Ambulatory Visit (INDEPENDENT_AMBULATORY_CARE_PROVIDER_SITE_OTHER): Payer: PPO

## 2022-08-12 VITALS — Ht 66.0 in | Wt 227.0 lb

## 2022-08-12 DIAGNOSIS — Z0000 Encounter for general adult medical examination without abnormal findings: Secondary | ICD-10-CM

## 2022-09-27 ENCOUNTER — Telehealth: Payer: Self-pay | Admitting: Family Medicine

## 2022-09-27 NOTE — Telephone Encounter (Signed)
Patient would like to discuss her medication. She is scheduled to go to the dentist to have a cleaning and a filling. She gets a little nervous when she has to go and would like to talk with a nurse.  CB# [phone]

## 2022-09-28 NOTE — Telephone Encounter (Signed)
it should be perfectly fine for her to take the medicines that she is on and still go to the dentist  If the dentist ever discusses any type of dental surgery such as implant then it would need to be coordinated Because in those situations have to hold off on Fosamax for a period of time

## 2022-09-28 NOTE — Telephone Encounter (Signed)
Patient states she gets real nervous at the dentist and wants to know if she takes all her regular meds when she goes- she had a friend who took her meds and got super nervous at dentist and then got the numbing shot and had a heart attack and died so she doesn't know if she should take her regular meds

## 2022-09-28 NOTE — Telephone Encounter (Signed)
Patient states it is for a cleaning and filling but she is super nervous at dentist

## 2022-09-28 NOTE — Telephone Encounter (Signed)
She should do fine I suppose if she is super nervous she could consider a low-dose nerve pill before the visit but she will have to have someone drive her

## 2022-09-30 ENCOUNTER — Other Ambulatory Visit: Payer: Self-pay | Admitting: Family Medicine

## 2022-09-30 MED ORDER — LORAZEPAM 0.5 MG PO TABS: ORAL_TABLET | ORAL | 0 refills | Status: DC

## 2022-09-30 NOTE — Telephone Encounter (Signed)
Patient states she would like one pill sent in of something for her nerves and will have someone drive her  [facility] [place]

## 2022-09-30 NOTE — Telephone Encounter (Signed)
Lorazepam was sent May take 1 tablet approximately 1 hour before dental visit No driving with the medicine Can cause drowsiness If patient is concerned that it may be too strong she can reduce it to a half a tablet 1 hour before procedure

## 2022-10-03 NOTE — Telephone Encounter (Signed)
Patient advised per Dr Lorin Picket: Lorazepam was sent May take 1 tablet approximately 1 hour before dental visit No driving with the medicine Can cause drowsiness If patient is concerned that it may be too strong she can reduce it to a half a tablet 1 hour before procedure Patient verbalized understanding and stated she has someone to drive her to the appointment.

## 2022-11-15 ENCOUNTER — Telehealth: Payer: Self-pay | Admitting: Family Medicine

## 2022-11-15 ENCOUNTER — Other Ambulatory Visit: Payer: Self-pay

## 2022-11-15 DIAGNOSIS — I10 Essential (primary) hypertension: Secondary | ICD-10-CM

## 2022-11-15 DIAGNOSIS — E1169 Type 2 diabetes mellitus with other specified complication: Secondary | ICD-10-CM | POA: Diagnosis not present

## 2022-11-15 DIAGNOSIS — Z79899 Other long term (current) drug therapy: Secondary | ICD-10-CM

## 2022-11-15 DIAGNOSIS — E785 Hyperlipidemia, unspecified: Secondary | ICD-10-CM

## 2022-11-15 DIAGNOSIS — E119 Type 2 diabetes mellitus without complications: Secondary | ICD-10-CM

## 2022-11-15 NOTE — Telephone Encounter (Signed)
Nurse -please close . Error

## 2022-11-16 ENCOUNTER — Ambulatory Visit (INDEPENDENT_AMBULATORY_CARE_PROVIDER_SITE_OTHER): Payer: PPO | Admitting: Family Medicine

## 2022-11-16 ENCOUNTER — Encounter: Payer: Self-pay | Admitting: Family Medicine

## 2022-11-16 VITALS — BP 129/84 | HR 70 | Ht 66.0 in | Wt 224.0 lb

## 2022-11-16 DIAGNOSIS — Z79899 Other long term (current) drug therapy: Secondary | ICD-10-CM | POA: Diagnosis not present

## 2022-11-16 DIAGNOSIS — Z7984 Long term (current) use of oral hypoglycemic drugs: Secondary | ICD-10-CM | POA: Diagnosis not present

## 2022-11-16 DIAGNOSIS — E1169 Type 2 diabetes mellitus with other specified complication: Secondary | ICD-10-CM | POA: Diagnosis not present

## 2022-11-16 DIAGNOSIS — I10 Essential (primary) hypertension: Secondary | ICD-10-CM | POA: Diagnosis not present

## 2022-11-16 DIAGNOSIS — E119 Type 2 diabetes mellitus without complications: Secondary | ICD-10-CM

## 2022-11-16 DIAGNOSIS — L089 Local infection of the skin and subcutaneous tissue, unspecified: Secondary | ICD-10-CM | POA: Diagnosis not present

## 2022-11-16 DIAGNOSIS — E785 Hyperlipidemia, unspecified: Secondary | ICD-10-CM

## 2022-11-16 LAB — HEPATIC FUNCTION PANEL
ALT: 18 [IU]/L (ref 0–32)
AST: 18 [IU]/L (ref 0–40)
Albumin: 4.7 g/dL (ref 3.9–4.9)
Alkaline Phosphatase: 74 [IU]/L (ref 44–121)
Bilirubin Total: 0.3 mg/dL (ref 0.0–1.2)
Bilirubin, Direct: 0.11 mg/dL (ref 0.00–0.40)
Total Protein: 7.4 g/dL (ref 6.0–8.5)

## 2022-11-16 LAB — BASIC METABOLIC PANEL (7)
BUN/Creatinine Ratio: 24 (ref 12–28)
BUN: 22 mg/dL (ref 8–27)
CO2: 20 mmol/L (ref 20–29)
Chloride: 106 mmol/L (ref 96–106)
Creatinine, Ser: 0.91 mg/dL (ref 0.57–1.00)
Glucose: 174 mg/dL — ABNORMAL HIGH (ref 70–99)
Potassium: 4.7 mmol/L (ref 3.5–5.2)
Sodium: 142 mmol/L (ref 134–144)
eGFR: 68 mL/min/{1.73_m2} (ref >59)

## 2022-11-16 LAB — LIPID PANEL
Chol/HDL Ratio: 2.9 ratio (ref 0.0–4.4)
Cholesterol, Total: 144 mg/dL (ref 100–199)
HDL: 50 mg/dL
LDL Chol Calc (NIH): 58 mg/dL (ref 0–99)
Triglycerides: 224 mg/dL — ABNORMAL HIGH (ref 0–149)
VLDL Cholesterol Cal: 36 mg/dL (ref 5–40)

## 2022-11-16 LAB — HEMOGLOBIN A1C
Est. average glucose Bld gHb Est-mCnc: 146 mg/dL
Hgb A1c MFr Bld: 6.7 % — ABNORMAL HIGH (ref 4.8–5.6)

## 2022-11-16 LAB — MICROALBUMIN / CREATININE URINE RATIO
Creatinine, Urine: 216.2 mg/dL
Microalb/Creat Ratio: 14 mg/g{creat} (ref 0–29)
Microalbumin, Urine: 29.7 ug/mL

## 2022-11-16 MED ORDER — METFORMIN HCL 500 MG PO TABS: 250.0000 mg | ORAL_TABLET | Freq: Two times a day (BID) | ORAL | 1 refills | Status: DC

## 2022-11-16 MED ORDER — ROSUVASTATIN CALCIUM 10 MG PO TABS: 10.0000 mg | ORAL_TABLET | Freq: Every day | ORAL | 1 refills | Status: DC

## 2022-11-16 MED ORDER — CEPHALEXIN 500 MG PO CAPS: 500.0000 mg | ORAL_CAPSULE | Freq: Four times a day (QID) | ORAL | 0 refills | Status: DC

## 2022-11-16 MED ORDER — LOSARTAN POTASSIUM 50 MG PO TABS: 50.0000 mg | ORAL_TABLET | Freq: Every morning | ORAL | 1 refills | Status: DC

## 2022-11-16 NOTE — Progress Notes (Signed)
Please mail to patient she does not utilize [employer]

## 2022-11-16 NOTE — Progress Notes (Signed)
Subjective:    Patient ID: Patricia Fuller, female    DOB: 1951/05/05, 72 y.o.   MRN: [id number]  HPI Patient arrives today for 5 month follow up. Patient states no concerns or issues today.  Patient for blood pressure check up.  The patient does have hypertension.   Patient relates dietary measures try to minimize salt The importance of healthy diet and activity were discussed Patient relates compliance  The patient was seen today as part of a comprehensive diabetic check up. Patient has diabetes Patient relates good compliance with taking the medication. We discussed their diet and exercise activities  We also discussed the importance of notifying us if any excessively high glucoses or low sugars.    Patient here for follow-up regarding cholesterol.    Patient relates taking medication on a regular basis Denies problems with medication Importance of dietary measures discussed Regular lab work regarding lipid and liver was checked and if needing additional labs was appropriately ordered She states that she is getting eye exam in 2 to 3 weeks she will have the results sent to [place] [facility] Encounter Medications as of 11/16/2022  Medication Sig   alendronate (FOSAMAX) 70 MG tablet Take 1 tablet (70 mg total) by mouth every 7 (seven) days. Take with a full glass of water on an empty stomach.   cephALEXin (KEFLEX) 500 MG capsule Take 1 capsule (500 mg total) by mouth 4 (four) times daily.   cetirizine (ZYRTEC) 10 MG tablet Take 10 mg by mouth daily as needed for allergies.   [DISCONTINUED] LORazepam (ATIVAN) 0.5 MG tablet May take 1 tablet 60 minutes before dental visit as needed caution drowsiness not to drive with medicine   [DISCONTINUED] losartan (COZAAR) 50 MG tablet Take 1 tablet (50 mg total) by mouth every morning.   [DISCONTINUED] metFORMIN (GLUCOPHAGE) 500 MG tablet Take 0.5 tablets (250 mg total) by mouth 2 (two) times daily with a meal.   [DISCONTINUED] rosuvastatin (CRESTOR)  10 MG tablet Take 1 tablet (10 mg total) by mouth daily.   [DISCONTINUED] traMADol (ULTRAM) 50 MG tablet Take 50 mg by mouth every 8 (eight) hours. Take 1 tablet by mouth every 8 hours as needed for up to 5 days   losartan (COZAAR) 50 MG tablet Take 1 tablet (50 mg total) by mouth every morning.   metFORMIN (GLUCOPHAGE) 500 MG tablet Take 0.5 tablets (250 mg total) by mouth 2 (two) times daily with a meal.   rosuvastatin (CRESTOR) 10 MG tablet Take 1 tablet (10 mg total) by mouth daily.   No facility-administered encounter medications on file as of 11/16/2022.   Cellulitis of the finger triggered by hangnail that got infected  Review of Systems     Objective:   Physical Exam General-in no acute distress Eyes-no discharge Lungs-respiratory rate normal, CTA CV-no murmurs,RRR Extremities skin warm dry no edema Neuro grossly normal Behavior normal, alert She cellulitis of the finger no abscess Results for orders placed or performed in visit on 11/15/22  Hemoglobin A1c  Result Value Ref Range   Hgb A1c MFr Bld 6.7 (H) 4.8 - 5.6 %   Est. average glucose Bld gHb Est-mCnc 146 mg/dL  Lipid panel  Result Value Ref Range   Cholesterol, Total 144 100 - 199 mg/dL   Triglycerides 962 (H) 0 - 149 mg/dL   HDL 50 >95 mg/dL   VLDL Cholesterol Cal 36 5 - 40 mg/dL   LDL Chol Calc (NIH) 58 0 - 99 mg/dL   Chol/HDL  Ratio 2.9 0.0 - 4.4 ratio  Hepatic function panel  Result Value Ref Range   Total Protein 7.4 6.0 - 8.5 g/dL   Albumin 4.7 3.9 - 4.9 g/dL   Bilirubin Total 0.3 0.0 - 1.2 mg/dL   Bilirubin, Direct 1.61 0.00 - 0.40 mg/dL   Alkaline Phosphatase 74 44 - 121 IU/L   AST 18 0 - 40 IU/L   ALT 18 0 - 32 IU/L  Basic Metabolic Panel (7)  Result Value Ref Range   Glucose 174 (H) 70 - 99 mg/dL   BUN 22 8 - 27 mg/dL   Creatinine, Ser 0.96 0.57 - 1.00 mg/dL   eGFR 68 >04 VW/UJW/1.19   BUN/Creatinine Ratio 24 12 - 28   Sodium 142 134 - 144 mmol/L   Potassium 4.7 3.5 - 5.2 mmol/L   Chloride  106 96 - 106 mmol/L   CO2 20 20 - 29 mmol/L  Microalbumin/Creatinine Ratio, Urine  Result Value Ref Range   Creatinine, Urine 216.2 Not Estab. mg/dL   Microalbumin, Urine 14.7 Not Estab. ug/mL   Microalb/Creat Ratio 14 0 - 29 mg/g creat         Assessment & Plan:  1. Hyperlipidemia associated with type 2 diabetes mellitus (HCC) LDL under good control.  Continue medication - Lipid panel  2. Diabetes mellitus without complication (HCC) A1c under good control watch diet and stay active continue medication - Hemoglobin A1c  3. Essential hypertension, benign Blood pressure under reasonable control continue medication watch diet closely minimize salt in the diet - Basic Metabolic Panel (7)  4. Finger infection Has cellulitis of the finger but I do not find evidence of an abscess Keflex 4 times daily for the next 7 to 14 days we will recheck her finger again in approximately 10 days and do a follow-up office visit for comprehensive in approximately 5 to 6 months  5. High risk medication use Lab work to be done before her follow-up visit in the fall - Hepatic Function Panel

## 2022-11-24 ENCOUNTER — Ambulatory Visit (INDEPENDENT_AMBULATORY_CARE_PROVIDER_SITE_OTHER): Payer: PPO | Admitting: Family Medicine

## 2022-11-24 VITALS — BP 133/88 | HR 79 | Wt 224.4 lb

## 2022-11-24 DIAGNOSIS — L089 Local infection of the skin and subcutaneous tissue, unspecified: Secondary | ICD-10-CM

## 2022-11-24 NOTE — Progress Notes (Signed)
   Subjective:    Patient ID: Patricia Fuller, female    DOB: 1951-01-30, 72 y.o.   MRN: [id number]  HPI Patient arrives today for recheck of finger. Patient states still  right pinky is still a little tender but is a lot better.  Patient with finger infection Now doing much better Denies any major setbacks    Review of Systems     Objective:   Physical Exam Finger actually looks much better.  Does appear to be healing up.  No sign of abscess       Assessment & Plan:  Finger Doing much better Continue current measures If any setbacks let us know keep regular follow-up visit later in the year lab work before that visit

## 2022-11-30 LAB — HM DIABETES EYE EXAM

## 2022-12-13 ENCOUNTER — Telehealth: Payer: Self-pay

## 2022-12-13 NOTE — Telephone Encounter (Signed)
Pt blood pressure is fluctuating she had high blood pressure when she went to the dentist that gives her anxiety. Yesterday 168/97 and then 162/93 this was at the dentist office.   Corrie Dandy [phone]

## 2022-12-14 ENCOUNTER — Encounter: Payer: Self-pay | Admitting: *Deleted

## 2022-12-14 NOTE — Telephone Encounter (Signed)
Patient would like to come in tomorrow for a BP check. She's been scheduled to nurse schedule

## 2022-12-14 NOTE — Telephone Encounter (Signed)
It is true that her blood pressures were elevated but on her most recent visit with his blood pressure was good  Nurses-please talk with the patient I am not quite sure what focus or question is.  If she feels she needs a low-dose nerve medication before seeing the dentist as long as she has someone who would drive her we could try something or if she is more concerned about her blood pressure we can set her up for a nurse visit early next week or late this week to recheck her blood pressure-please delineate out how I need to help thank you

## 2022-12-15 ENCOUNTER — Telehealth: Payer: Self-pay

## 2022-12-15 ENCOUNTER — Ambulatory Visit: Payer: PPO

## 2022-12-15 NOTE — Progress Notes (Signed)
Pt comes in today for a Blood Pressure check. Blood Pressure: 144/85

## 2022-12-15 NOTE — Telephone Encounter (Signed)
PT came in today for Nurses visit, see note for blood pressure. Pt states she would like the medication she discussed with Dr Gerda Diss for her Dentist Appt. Pharmacy: [facility]

## 2022-12-16 ENCOUNTER — Other Ambulatory Visit: Payer: Self-pay | Admitting: Family Medicine

## 2022-12-16 MED ORDER — ALPRAZOLAM 1 MG PO TABS: ORAL_TABLET | ORAL | 0 refills | Status: DC

## 2022-12-16 NOTE — Telephone Encounter (Signed)
Alprazolam was sent in may take a whole tablet, half tablet 60 to 90 minutes before procedure caution drowsiness do not drive with medication

## 2022-12-19 NOTE — Telephone Encounter (Signed)
Patient has been informed per drs notes. 

## 2023-04-30 ENCOUNTER — Other Ambulatory Visit: Payer: Self-pay | Admitting: Family Medicine

## 2023-04-30 MED ORDER — ALENDRONATE SODIUM 70 MG PO TABS: 70.0000 mg | ORAL_TABLET | ORAL | 11 refills | Status: DC

## 2023-05-12 ENCOUNTER — Telehealth: Payer: Self-pay

## 2023-05-12 NOTE — Telephone Encounter (Signed)
Reason for CRM: Patient called in stating she need the paperwork for the blood work sent to lab before appointment on 12/11   Called pt back to explain she does not need the paper work she can go to the lab and get it drawn but needs to be done before 11/16/2022 because that is when they expire.

## 2023-05-15 DIAGNOSIS — E1169 Type 2 diabetes mellitus with other specified complication: Secondary | ICD-10-CM | POA: Diagnosis not present

## 2023-05-15 DIAGNOSIS — E785 Hyperlipidemia, unspecified: Secondary | ICD-10-CM | POA: Diagnosis not present

## 2023-05-15 DIAGNOSIS — I10 Essential (primary) hypertension: Secondary | ICD-10-CM | POA: Diagnosis not present

## 2023-05-15 DIAGNOSIS — Z79899 Other long term (current) drug therapy: Secondary | ICD-10-CM | POA: Diagnosis not present

## 2023-05-15 DIAGNOSIS — E119 Type 2 diabetes mellitus without complications: Secondary | ICD-10-CM | POA: Diagnosis not present

## 2023-05-16 LAB — BASIC METABOLIC PANEL (7)
BUN/Creatinine Ratio: 20 (ref 12–28)
BUN: 17 mg/dL (ref 8–27)
CO2: 22 mmol/L (ref 20–29)
Chloride: 106 mmol/L (ref 96–106)
Creatinine, Ser: 0.84 mg/dL (ref 0.57–1.00)
Glucose: 118 mg/dL — ABNORMAL HIGH (ref 70–99)
Potassium: 4.6 mmol/L (ref 3.5–5.2)
Sodium: 143 mmol/L (ref 134–144)
eGFR: 75 mL/min/{1.73_m2} (ref >59)

## 2023-05-16 LAB — LIPID PANEL
Chol/HDL Ratio: 2.6 {ratio} (ref 0.0–4.4)
Cholesterol, Total: 138 mg/dL (ref 100–199)
HDL: 53 mg/dL (ref >39)
LDL Chol Calc (NIH): 57 mg/dL (ref 0–99)
Triglycerides: 165 mg/dL — ABNORMAL HIGH (ref 0–149)
VLDL Cholesterol Cal: 28 mg/dL (ref 5–40)

## 2023-05-16 LAB — HEPATIC FUNCTION PANEL
ALT: 18 [IU]/L (ref 0–32)
AST: 18 [IU]/L (ref 0–40)
Albumin: 4.4 g/dL (ref 3.9–4.9)
Alkaline Phosphatase: 69 [IU]/L (ref 44–121)
Bilirubin Total: 0.4 mg/dL (ref 0.0–1.2)
Bilirubin, Direct: 0.15 mg/dL (ref 0.00–0.40)
Total Protein: 6.6 g/dL (ref 6.0–8.5)

## 2023-05-16 LAB — HEMOGLOBIN A1C
Est. average glucose Bld gHb Est-mCnc: 137 mg/dL
Hgb A1c MFr Bld: 6.4 % — ABNORMAL HIGH (ref 4.8–5.6)

## 2023-05-18 ENCOUNTER — Encounter: Payer: Self-pay | Admitting: Family Medicine

## 2023-05-18 ENCOUNTER — Ambulatory Visit: Payer: PPO | Admitting: Family Medicine

## 2023-05-18 VITALS — BP 152/78 | HR 61 | Temp 97.3°F | Wt 220.0 lb

## 2023-05-18 DIAGNOSIS — E785 Hyperlipidemia, unspecified: Secondary | ICD-10-CM | POA: Diagnosis not present

## 2023-05-18 DIAGNOSIS — I10 Essential (primary) hypertension: Secondary | ICD-10-CM | POA: Diagnosis not present

## 2023-05-18 DIAGNOSIS — E1169 Type 2 diabetes mellitus with other specified complication: Secondary | ICD-10-CM | POA: Diagnosis not present

## 2023-05-18 DIAGNOSIS — E119 Type 2 diabetes mellitus without complications: Secondary | ICD-10-CM

## 2023-05-18 MED ORDER — ALPRAZOLAM 0.5 MG PO TABS: ORAL_TABLET | ORAL | 0 refills | Status: DC

## 2023-05-18 MED ORDER — METFORMIN HCL 500 MG PO TABS: 250.0000 mg | ORAL_TABLET | Freq: Two times a day (BID) | ORAL | 1 refills | Status: DC

## 2023-05-18 MED ORDER — ROSUVASTATIN CALCIUM 10 MG PO TABS: 10.0000 mg | ORAL_TABLET | Freq: Every day | ORAL | 1 refills | Status: DC

## 2023-05-18 MED ORDER — LOSARTAN POTASSIUM 50 MG PO TABS: 50.0000 mg | ORAL_TABLET | Freq: Every morning | ORAL | 1 refills | Status: DC

## 2023-05-18 NOTE — Progress Notes (Signed)
Subjective:    Patient ID: Patricia Fuller, female    DOB: [birth year], 72 y.o.   MRN: [id number]  Discussed the use of AI scribe software for clinical note transcription with the patient, who gave verbal consent to proceed.  History of Present Illness   The patient, with an unspecified medical history, reports feeling generally well and active, engaging in regular activities such as cooking and walking. She mentions a recent dental visit where she was prescribed medication that helped with her nervousness and occasional panic. The patient has been using this medication sparingly, mainly at bedtime to help with relaxation after hyperactive days.  The patient also reports a recent increase in social activities, including family gatherings and an upcoming trip to the [place]. Despite these activities, the patient does not report any significant changes in her health status.  The patient also mentions a recent lab work, indicating an improvement in her A1c levels from 6.7 to 6.4. She also reports that her liver functions and kidney functions are normal, and her cholesterol levels are within the desired range.  The patient has been mindful of her diet, minimizing sugars and starchy foods. She has also been consistent with her medication regimen. However, the patient declines to undergo mammogram and flu vaccination.  The patient's main concern appears to be managing her nervousness and panic, for which she has found relief in the medication prescribed by her dentist. She expresses a desire to have a similar medication on hand for when needed.         Review of Systems     Objective:    Physical Exam   VITALS: BP- 136/82 CARDIOVASCULAR: Heart sounds normal. EXTREMITIES: No swelling at ankles.     General-in no acute distress Eyes-no discharge Lungs-respiratory rate normal, CTA CV-no murmurs,RRR Extremities skin warm dry no edema Neuro grossly normal Behavior normal, alert        Assessment & Plan:  Assessment and Plan    Anxiety Reports occasional panic and nervousness. Has been using prescribed medication sparingly and finds it helpful. -Continue current medication as needed. -Advised not to drive when taking medication.  Hyperlipidemia LDL is well controlled at 57. -Continue current management.  Diabetes A1C has improved from 6.7 to 6.4. -Continue current management and maintain diet control.  Hypertension Blood pressure has improved. -Continue current management and minimize salt intake.  General Health Maintenance -Declined mammogram and flu vaccine. -Consider Shingrix vaccine for shingles prevention. -Follow-up in 6 months.     1. Hyperlipidemia associated with type 2 diabetes mellitus (HCC) (Primary) Hyperlipidemia-importance of diet, weight control, activity, compliance with medications discussed.   Recent labs reviewed.   Any additional labs or refills ordered.   Importance of keeping under good control discussed. Regular follow-up visits discussed   2. Diabetes mellitus without complication (HCC) The patient was seen today as part of a comprehensive visit for diabetes. The importance of keeping her A1c at or below 7 range was discussed.  Discussed diet, activity, and medication compliance Emphasized healthy eating primarily with vegetables fruits and if utilizing meats lean meats such as chicken or fish grilled baked broiled Avoid sugary drinks Minimize and avoid processed foods Fit in regular physical activity preferably 25 to 30 minutes 4 times per week Standard follow-up visit recommended.  Patient aware lack of control and follow-up increases risk of diabetic complications. Regular follow-up visits Yearly ophthalmology Yearly foot exam   3. Essential hypertension, benign HTN- patient seen for follow-up regarding HTN.  Diet, medication compliance, appropriate labs and refills were completed.   Importance of keeping blood  pressure under good control to lessen the risk of complications discussed Regular follow-up visits discussed  Portion control regular physical activity to keep weight and check  Follow-up in 6 months  Labs reviewed in detail with patient  Medications updated

## 2023-06-19 ENCOUNTER — Ambulatory Visit: Payer: Self-pay | Admitting: Family Medicine

## 2023-06-19 NOTE — Telephone Encounter (Signed)
 1st attempt to reach pt, no VM set up.

## 2023-06-20 NOTE — Telephone Encounter (Signed)
 Patricia Fuller    [facility] Triage attempted call, patient does not want to speak to nurse or be seen.

## 2023-06-21 ENCOUNTER — Ambulatory Visit: Payer: Self-pay | Admitting: Family Medicine

## 2023-06-21 NOTE — Telephone Encounter (Signed)
 Copied from CRM [id number]. Topic: Clinical - Red Word Triage >> Jun 21, 2023  9:54 AM Maryln Sober wrote: Red Word that prompted transfer to Nurse Triage: Patient is having congestion in her right nostril. She has asked for an antibiotic be sent to the pharmacy. Please give patient a call back at [phone]   1st attempt to call, unable to leave voicemail

## 2023-06-21 NOTE — Telephone Encounter (Signed)
  Chief Complaint: nasal congestion Symptoms: congestion Frequency: "a few days" Pertinent Negatives: Patient denies SOB, fever, cough Disposition: [] ED /[] Urgent Care (no appt availability in office) / [x] Appointment(In office/virtual)/ []  [facility]/ [] Home Care/ [] Refused Recommended Disposition /[]  Mobile Bus/ []  Follow-up with PCP Additional Notes: Patient calls stating she has nasal congestion and pain in her R nostril and cheek for "a few days". She states she has tried OTC medications and decongestants with no relief. Denies fever, SOB, cough. Per protocol, patient to be evaluated within 3 days. Patient scheduled with next available provider in office for 06/22/23 at 0920. Care advice reviewed, patient verbalized understanding. Alerting PCP for review.   Copied from CRM [id number]. Topic: Clinical - Red Word Triage >> Jun 21, 2023  9:54 AM Maryln Sober wrote: Red Word that prompted transfer to Nurse Triage: Patient is having congestion in her right nostril. She has asked for an antibiotic be sent to the pharmacy. Please give patient a call back at [phone]   1st attempt to call, unable to leave voicemail   Reason for Disposition . [1] Using nasal washes and pain medicine > 24 hours AND [2] sinus pain (around cheekbone or eye) persists  Answer Assessment - Initial Assessment Questions 1. LOCATION: "Where does it hurt?"      R side nostril up towards eye 2. ONSET: "When did the sinus pain start?"  (e.g., hours, days)      A couple days 3. SEVERITY: "How bad is the pain?"   (Scale 1-10; mild, moderate or severe)   - MILD (1-3): doesn't interfere with normal activities    - MODERATE (4-7): interferes with normal activities (e.g., work or school) or awakens from sleep   - SEVERE (8-10): excruciating pain and patient unable to do any normal activities        Denies pain 4. RECURRENT SYMPTOM: "Have you ever had sinus problems before?" If Yes, ask: "When was the  last time?" and "What happened that time?"      Yes, a few years ago and was given medication 5. NASAL CONGESTION: "Is the nose blocked?" If Yes, ask: "Can you open it or must you breathe through your mouth?"     Mild, clears when blown 6. NASAL DISCHARGE: "Do you have discharge from your nose?" If so ask, "What color?"     Clear 7. FEVER: "Do you have a fever?" If Yes, ask: "What is it, how was it measured, and when did it start?"      Denies 8. OTHER SYMPTOMS: "Do you have any other symptoms?" (e.g., sore throat, cough, earache, difficulty breathing)     Denies  Protocols used: Sinus Pain or Congestion-A-AH

## 2023-06-22 ENCOUNTER — Encounter: Payer: Self-pay | Admitting: Family Medicine

## 2023-06-22 ENCOUNTER — Ambulatory Visit: Payer: PPO | Admitting: Family Medicine

## 2023-06-22 VITALS — BP 124/84 | HR 68 | Temp 97.3°F | Ht 66.0 in

## 2023-06-22 DIAGNOSIS — J019 Acute sinusitis, unspecified: Secondary | ICD-10-CM

## 2023-06-22 DIAGNOSIS — J329 Chronic sinusitis, unspecified: Secondary | ICD-10-CM | POA: Insufficient documentation

## 2023-06-22 MED ORDER — AMOXICILLIN-POT CLAVULANATE 875-125 MG PO TABS: 1.0000 | ORAL_TABLET | Freq: Two times a day (BID) | ORAL | 0 refills | Status: DC

## 2023-06-22 MED ORDER — IPRATROPIUM BROMIDE 0.06 % NA SOLN: 2.0000 | Freq: Four times a day (QID) | NASAL | 0 refills | Status: AC | PRN

## 2023-06-22 NOTE — Assessment & Plan Note (Signed)
Treating with Augmentin and Atrovent.

## 2023-06-22 NOTE — Progress Notes (Signed)
Subjective:  Patient ID: Patricia Fuller, female    DOB: 07-25-1950  Age: 73 y.o. MRN: [id number]  CC:   Chief Complaint  Patient presents with   Sinusitis    Sinus congestion no color mucus No fevers x's 10days    HPI:  73 year old female presents for evaluation of the above.  10-day history of symptoms.  She reports sinus congestion with associated mucus production when blowing her nose.  She localizes the discomfort to the right maxillary region and the right side of the nose near the eye.  She has been using Zyrtec without relief.  No fever.  No cough, sore throat, ear pain.  No known exacerbating factors.  No other complaints.  Patient Active Problem List   Diagnosis Date Noted   Sinusitis 06/22/2023   Localized osteoporosis without current pathological fracture 06/17/2022   Diabetes mellitus without complication (HCC) 06/17/2022   Aortic atherosclerosis (HCC) 05/18/2022   Calculus of gallbladder without cholecystitis without obstruction 04/27/2022   Hyperlipidemia associated with type 2 diabetes mellitus (HCC) 01/18/2021   Primary osteoarthritis of both knees 08/26/2015   Peripheral neuropathy 09/16/2014   Essential hypertension, benign 09/26/2012    Social Hx   Social History   Socioeconomic History   Marital status: Single    Spouse name: Not on file   Number of children: Not on file   Years of education: Not on file   Highest education level: Not on file  Occupational History   Not on file  Tobacco Use   Smoking status: Never   Smokeless tobacco: Never  Vaping Use   Vaping status: Never Used  Substance and Sexual Activity   Alcohol use: No   Drug use: No   Sexual activity: Never  Other Topics Concern   Not on file  Social History Narrative   Not on file   Social Drivers of Health   Financial Resource Strain: Low Risk  (08/12/2022)   Overall Financial Resource Strain (CARDIA)    Difficulty of Paying Living Expenses: Not hard at all  Food Insecurity: No  Food Insecurity (08/12/2022)   Hunger Vital Sign    Worried About Running Out of Food in the Last Year: Never true    Ran Out of Food in the Last Year: Never true  Transportation Needs: No Transportation Needs (08/12/2022)   PRAPARE - Administrator, Civil Service (Medical): No    Lack of Transportation (Non-Medical): No  Physical Activity: Insufficiently Active (08/12/2022)   Exercise Vital Sign    Days of Exercise per Week: 3 days    Minutes of Exercise per Session: 30 min  Stress: No Stress Concern Present (08/12/2022)   [employer] of Occupational Health - Occupational Stress Questionnaire    Feeling of Stress : Not at all  Social Connections: Socially Isolated (08/12/2022)   Social Connection and Isolation Panel [NHANES]    Frequency of Communication with Friends and Family: More than three times a week    Frequency of Social Gatherings with Friends and Family: More than three times a week    Attends Religious Services: Never    Database administrator or Organizations: No    Attends Banker Meetings: Never    Marital Status: Divorced    Review of Systems Per HPI  Objective:  BP 124/84   Pulse 68   Temp (!) 97.3 F (36.3 C)   Ht 5\' 6"  (1.676 m)   SpO2 98%   BMI 35.51 kg/m  06/22/2023    9:14 AM 05/18/2023    8:26 AM 12/15/2022   10:45 AM  BP/Weight  Systolic BP 124 152 144  Diastolic BP 84 78 85  Wt. (Lbs)  220   BMI  35.51 kg/m2     Physical Exam Vitals and nursing note reviewed.  Constitutional:      General: She is not in acute distress.    Appearance: Normal appearance. She is obese.  HENT:     Head: Normocephalic and atraumatic.     Right Ear: Tympanic membrane normal.     Left Ear: Tympanic membrane normal.     Mouth/Throat:     Pharynx: Oropharynx is clear.  Eyes:     General:        Right eye: No discharge.        Left eye: No discharge.     Conjunctiva/sclera: Conjunctivae normal.  Cardiovascular:     Rate and  Rhythm: Normal rate and regular rhythm.  Pulmonary:     Effort: Pulmonary effort is normal.     Breath sounds: Normal breath sounds. No wheezing or rales.  Neurological:     Mental Status: She is alert.  Psychiatric:        Mood and Affect: Mood normal.        Behavior: Behavior normal.     Lab Results  Component Value Date   WBC 7.6 04/26/2022   HGB 14.2 04/26/2022   HCT 43.8 04/26/2022   PLT 196 04/26/2022   GLUCOSE 118 (H) 05/15/2023   CHOL 138 05/15/2023   TRIG 165 (H) 05/15/2023   HDL 53 05/15/2023   LDLCALC 57 05/15/2023   ALT 18 05/15/2023   AST 18 05/15/2023   NA 143 05/15/2023   K 4.6 05/15/2023   CL 106 05/15/2023   CREATININE 0.84 05/15/2023   BUN 17 05/15/2023   CO2 22 05/15/2023   INR 0.98 01/02/2014   HGBA1C 6.4 (H) 05/15/2023     Assessment & Plan:   Problem List Items Addressed This Visit       Respiratory   Sinusitis - Primary   Treating with Augmentin and Atrovent.      Relevant Medications   amoxicillin-clavulanate (AUGMENTIN) 875-125 MG tablet   ipratropium (ATROVENT) 0.06 % nasal spray    Meds ordered this encounter  Medications   amoxicillin-clavulanate (AUGMENTIN) 875-125 MG tablet    Sig: Take 1 tablet by mouth 2 (two) times daily.    Dispense:  14 tablet    Refill:  0   ipratropium (ATROVENT) 0.06 % nasal spray    Sig: Place 2 sprays into both nostrils 4 (four) times daily as needed for rhinitis.    Dispense:  15 mL    Refill:  0    Follow-up:  Return if symptoms worsen or fail to improve.  Everlene Other DO [facility]

## 2023-07-24 ENCOUNTER — Emergency Department (HOSPITAL_COMMUNITY): Payer: PPO

## 2023-07-24 ENCOUNTER — Emergency Department (HOSPITAL_COMMUNITY)
Admission: EM | Admit: 2023-07-24 | Discharge: 2023-07-25 | Disposition: A | Discharge status: Home / Self Care | Payer: PPO | Attending: Student | Admitting: Student

## 2023-07-24 ENCOUNTER — Other Ambulatory Visit: Payer: Self-pay

## 2023-07-24 ENCOUNTER — Encounter (HOSPITAL_COMMUNITY): Payer: Self-pay | Admitting: Emergency Medicine

## 2023-07-24 DIAGNOSIS — I10 Essential (primary) hypertension: Secondary | ICD-10-CM | POA: Insufficient documentation

## 2023-07-24 DIAGNOSIS — E1142 Type 2 diabetes mellitus with diabetic polyneuropathy: Secondary | ICD-10-CM | POA: Diagnosis not present

## 2023-07-24 DIAGNOSIS — K5792 Diverticulitis of intestine, part unspecified, without perforation or abscess without bleeding: Secondary | ICD-10-CM

## 2023-07-24 DIAGNOSIS — Z79899 Other long term (current) drug therapy: Secondary | ICD-10-CM | POA: Insufficient documentation

## 2023-07-24 DIAGNOSIS — R590 Localized enlarged lymph nodes: Secondary | ICD-10-CM | POA: Diagnosis not present

## 2023-07-24 DIAGNOSIS — K8020 Calculus of gallbladder without cholecystitis without obstruction: Secondary | ICD-10-CM | POA: Diagnosis not present

## 2023-07-24 DIAGNOSIS — R1030 Lower abdominal pain, unspecified: Secondary | ICD-10-CM | POA: Diagnosis present

## 2023-07-24 DIAGNOSIS — R591 Generalized enlarged lymph nodes: Secondary | ICD-10-CM

## 2023-07-24 DIAGNOSIS — Z7984 Long term (current) use of oral hypoglycemic drugs: Secondary | ICD-10-CM | POA: Diagnosis not present

## 2023-07-24 DIAGNOSIS — K5732 Diverticulitis of large intestine without perforation or abscess without bleeding: Secondary | ICD-10-CM | POA: Insufficient documentation

## 2023-07-24 DIAGNOSIS — N200 Calculus of kidney: Secondary | ICD-10-CM | POA: Diagnosis not present

## 2023-07-24 LAB — CBC
HCT: 42 % (ref 36.0–46.0)
Hemoglobin: 13.4 g/dL (ref 12.0–15.0)
MCH: 29.1 pg (ref 26.0–34.0)
MCHC: 31.9 g/dL (ref 30.0–36.0)
MCV: 91.1 fL (ref 80.0–100.0)
Platelets: 179 10*3/uL (ref 150–400)
RBC: 4.61 MIL/uL (ref 3.87–5.11)
RDW: 13.1 % (ref 11.5–15.5)
WBC: 7.9 10*3/uL (ref 4.0–10.5)
nRBC: 0 % (ref 0.0–0.2)

## 2023-07-24 LAB — COMPREHENSIVE METABOLIC PANEL
ALT: 16 U/L (ref 0–44)
AST: 16 U/L (ref 15–41)
Albumin: 4 g/dL (ref 3.5–5.0)
Alkaline Phosphatase: 53 U/L (ref 38–126)
Anion gap: 8 (ref 5–15)
BUN: 13 mg/dL (ref 8–23)
CO2: 24 mmol/L (ref 22–32)
Calcium: 9.3 mg/dL (ref 8.9–10.3)
Chloride: 106 mmol/L (ref 98–111)
Creatinine, Ser: 0.89 mg/dL (ref 0.44–1.00)
GFR, Estimated: >60 mL/min (ref >60)
Glucose, Bld: 163 mg/dL — ABNORMAL HIGH (ref 70–99)
Potassium: 3.6 mmol/L (ref 3.5–5.1)
Sodium: 138 mmol/L (ref 135–145)
Total Bilirubin: 0.7 mg/dL (ref 0.0–1.2)
Total Protein: 7.1 g/dL (ref 6.5–8.1)

## 2023-07-24 LAB — LIPASE, BLOOD: Lipase: 31 U/L (ref 11–51)

## 2023-07-24 MED ORDER — IOHEXOL 300 MG/ML  SOLN
100.0000 mL | Freq: Once | INTRAMUSCULAR | Status: AC | PRN
  Administered 2023-07-25: 100 mL via INTRAVENOUS

## 2023-07-24 NOTE — ED Triage Notes (Signed)
Pt states she has been having lower abd pain since last week. Yesterday she was concerned she was constipated, she took laxatives and had good results. Today she began to have decreased urinary output.

## 2023-07-25 DIAGNOSIS — N200 Calculus of kidney: Secondary | ICD-10-CM | POA: Diagnosis not present

## 2023-07-25 DIAGNOSIS — R590 Localized enlarged lymph nodes: Secondary | ICD-10-CM | POA: Diagnosis not present

## 2023-07-25 DIAGNOSIS — K8020 Calculus of gallbladder without cholecystitis without obstruction: Secondary | ICD-10-CM | POA: Diagnosis not present

## 2023-07-25 DIAGNOSIS — K5732 Diverticulitis of large intestine without perforation or abscess without bleeding: Secondary | ICD-10-CM | POA: Diagnosis not present

## 2023-07-25 LAB — URINALYSIS, ROUTINE W REFLEX MICROSCOPIC
Bilirubin Urine: NEGATIVE
Glucose, UA: NEGATIVE mg/dL
Hgb urine dipstick: NEGATIVE
Ketones, ur: NEGATIVE mg/dL
Nitrite: NEGATIVE
Protein, ur: NEGATIVE mg/dL
Specific Gravity, Urine: 1.016 (ref 1.005–1.030)
pH: 5 (ref 5.0–8.0)

## 2023-07-25 MED ORDER — AMOXICILLIN-POT CLAVULANATE 875-125 MG PO TABS: 1.0000 | ORAL_TABLET | Freq: Two times a day (BID) | ORAL | 0 refills | Status: DC

## 2023-07-25 MED ORDER — AMOXICILLIN-POT CLAVULANATE 875-125 MG PO TABS: 1.0000 | ORAL_TABLET | Freq: Two times a day (BID) | ORAL | 0 refills | Status: AC

## 2023-07-25 MED ORDER — FOSFOMYCIN TROMETHAMINE 3 G PO PACK
3.0000 g | PACK | Freq: Once | ORAL | Status: AC
  Administered 2023-07-25: 3 g via ORAL
  Filled 2023-07-25: qty 3

## 2023-07-25 MED ORDER — AMOXICILLIN-POT CLAVULANATE 875-125 MG PO TABS: 1.0000 | ORAL_TABLET | Freq: Once | ORAL | Status: DC

## 2023-07-25 MED ORDER — AMOXICILLIN-POT CLAVULANATE 875-125 MG PO TABS
1.0000 | ORAL_TABLET | Freq: Once | ORAL | Status: AC
  Administered 2023-07-25: 1 via ORAL
  Filled 2023-07-25: qty 1

## 2023-07-25 NOTE — ED Provider Notes (Signed)
[place] EMERGENCY DEPARTMENT AT [facility] Provider Note  CSN: [id number] Arrival date & time: 07/24/23 1936  Chief Complaint(s) Abdominal Pain  HPI Patricia Fuller is a 73 y.o. female with PMH HTN, HLD who presents emergency department for evaluation of abdominal pain.  States that for the last week she has had intermittent lower crampy abdominal pain.  Took laxatives at home with appropriate bowel movements but pain has persisted.  Also concern for possible decreased urinary output.  Denies associate chest pain, shortness of breath, headache, fever or other systemic symptoms.  No nausea or vomiting.   Past Medical History Past Medical History:  Diagnosis Date   Hyperlipidemia    Hypertension    Prediabetes    Renal calculi    Patient Active Problem List   Diagnosis Date Noted   Sinusitis 06/22/2023   Localized osteoporosis without current pathological fracture 06/17/2022   Diabetes mellitus without complication (HCC) 06/17/2022   Aortic atherosclerosis (HCC) 05/18/2022   Calculus of gallbladder without cholecystitis without obstruction 04/27/2022   Hyperlipidemia associated with type 2 diabetes mellitus (HCC) 01/18/2021   Primary osteoarthritis of both knees 08/26/2015   Peripheral neuropathy 09/16/2014   Essential hypertension, benign 09/26/2012   Home Medication(s) Prior to Admission medications   Medication Sig Start Date End Date Taking? Authorizing Provider  alendronate (FOSAMAX) 70 MG tablet Take 1 tablet (70 mg total) by mouth every 7 (seven) days. Take with a full glass of water on an empty stomach. 04/30/23   Babs Sciara, MD  amoxicillin-clavulanate (AUGMENTIN) 875-125 MG tablet Take 1 tablet by mouth every 12 (twelve) hours for 5 days. 07/25/23 07/30/23  Etienne Mowers, MD  cetirizine (ZYRTEC) 10 MG tablet Take 10 mg by mouth daily as needed for allergies.    [provider]  ipratropium (ATROVENT) 0.06 % nasal spray Place 2 sprays into both  nostrils 4 (four) times daily as needed for rhinitis. 06/22/23   Tommie Sams, DO  losartan (COZAAR) 50 MG tablet Take 1 tablet (50 mg total) by mouth every morning. 05/18/23   Babs Sciara, MD  metFORMIN (GLUCOPHAGE) 500 MG tablet Take 0.5 tablets (250 mg total) by mouth 2 (two) times daily with a meal. 05/18/23   Luking, Jonna Coup, MD  rosuvastatin (CRESTOR) 10 MG tablet Take 1 tablet (10 mg total) by mouth daily. 05/18/23   Babs Sciara, MD                                                                                                                                    Past Surgical History Past Surgical History:  Procedure Laterality Date   BIOPSY  10/19/2020   Procedure: BIOPSY;  Surgeon: Lanelle Bal, DO;  Location: [facility];  Service: Endoscopy;;  transverse colon polyp   COLONOSCOPY WITH PROPOFOL N/A 10/19/2020   Procedure: COLONOSCOPY WITH PROPOFOL;  Surgeon: Lanelle Bal, DO;  Location:  [facility];  Service: Endoscopy;  Laterality: N/A;  am appt   LITHOTRIPSY     POLYPECTOMY  10/19/2020   Procedure: POLYPECTOMY;  Surgeon: Lanelle Bal, DO;  Location: [facility];  Service: Endoscopy;;  transverse colon polyp, descending colon polyp, sigmoid colon polyp    TUBAL LIGATION     Family History Family History  Problem Relation Age of Onset   Hypertension Mother    Diabetes Mother    Heart disease Mother    Colon cancer Neg Hx     Social History Social History   Tobacco Use   Smoking status: Never   Smokeless tobacco: Never  Vaping Use   Vaping status: Never Used  Substance Use Topics   Alcohol use: No   Drug use: No   Allergies Patient has no known allergies.  Review of Systems Review of Systems  Gastrointestinal:  Positive for abdominal pain.    Physical Exam Vital Signs  I have reviewed the triage vital signs BP (!) 171/98 (BP Location: Right Arm)   Pulse 78   Temp 98.7 F (37.1 C) (Oral)   Resp 17   Ht 5\' 6"  (1.676 m)   Wt 99.8  kg   SpO2 97%   BMI 35.51 kg/m   Physical Exam Vitals and nursing note reviewed.  Constitutional:      General: She is not in acute distress.    Appearance: She is well-developed.  HENT:     Head: Normocephalic and atraumatic.  Eyes:     Conjunctiva/sclera: Conjunctivae normal.  Pulmonary:     Effort: Pulmonary effort is normal. No respiratory distress.  Abdominal:     Tenderness: There is abdominal tenderness in the suprapubic area and left lower quadrant.  Musculoskeletal:        General: No swelling.     Cervical back: Neck supple.  Skin:    General: Skin is warm and dry.     Capillary Refill: Capillary refill takes less than 2 seconds.  Neurological:     Mental Status: She is alert.  Psychiatric:        Mood and Affect: Mood normal.     ED Results and Treatments Labs (all labs ordered are listed, but only abnormal results are displayed) Labs Reviewed  COMPREHENSIVE METABOLIC PANEL - Abnormal; Notable for the following components:      Result Value   Glucose, Bld 163 (*)    All other components within normal limits  URINALYSIS, ROUTINE W REFLEX MICROSCOPIC - Abnormal; Notable for the following components:   APPearance HAZY (*)    Leukocytes,Ua TRACE (*)    Bacteria, UA MANY (*)    All other components within normal limits  URINE CULTURE  LIPASE, BLOOD  CBC                                                                                                                          Radiology CT ABDOMEN PELVIS W CONTRAST Result  Date: 07/25/2023 CLINICAL DATA:  Left lower quadrant abdominal pain EXAM: CT ABDOMEN AND PELVIS WITH CONTRAST TECHNIQUE: Multidetector CT imaging of the abdomen and pelvis was performed using the standard protocol following bolus administration of intravenous contrast. RADIATION DOSE REDUCTION: This exam was performed according to the departmental dose-optimization program which includes automated exposure control, adjustment of the mA and/or kV  according to patient size and/or use of iterative reconstruction technique. CONTRAST:  OMNIPAQUE IOHEXOL 300 MG/ML  SOLN COMPARISON:  04/26/2022 FINDINGS: Lower chest: No acute abnormality.  Small hiatal hernia Hepatobiliary: Cholelithiasis without pericholecystic inflammatory change. Liver unremarkable. No intra or extrahepatic biliary ductal dilation. Pancreas: Unremarkable Spleen: Unremarkable Adrenals/Urinary Tract: The adrenal glands are unremarkable. The kidneys are normal in size and position. 2 mm punctate nonobstructing calculus noted within the interpolar region of the left kidney. Multiple bilateral parapelvic cysts are identified for which no follow-up imaging is recommended. The kidneys otherwise unremarkable. The bladder is. Stomach/Bowel: Severe sigmoid diverticulosis. Superimposed circumferential bowel wall thickening and pericolonic inflammatory stranding is present in keeping acute, uncomplicated sigmoid diverticulitis best seen on image # 69/2 and 60/4. No evidence of obstruction. No free intraperitoneal gas or fluid. No loculated intra-abdominal fluid collections. Stomach, small bowel, and large bowel are otherwise unremarkable. Appendix normal. Vascular/Lymphatic: Moderate aortoiliac atherosclerotic calcification. Retroaortic left renal vein. No aortic aneurysm. There is pathologic bilateral common external iliac lymphadenopathy with the index lymph node within the right external iliac lymph node group measuring 20 mm in short axis diameter at axial image # 74/2. Reproductive: Uterus and bilateral adnexa are unremarkable. Other: No abdominal wall hernia Musculoskeletal: Osseous structures are age-appropriate. No acute bone abnormality. IMPRESSION: 1. Acute, uncomplicated sigmoid diverticulitis. Superimposed severe sigmoid diverticulosis. 2. Pathologic bilateral common and external iliac lymphadenopathy, progressive since prior examination. This would be an unusual pattern for superimpose  reactive adenopathy and interval progression of disease since prior examination of 04/26/2022 is favored in keeping with a indolent lymphoproliferative process such as chronic lymphocytic leukemia. 3. Cholelithiasis. 4. Minimal left nonobstructing nephrolithiasis. Aortic Atherosclerosis (ICD10-I70.0). Electronically Signed   By: Helyn Numbers M.D.   On: 07/25/2023 02:10    Pertinent labs & imaging results that were available during my care of the patient were reviewed by me and considered in my medical decision making (see MDM for details).  Medications Ordered in ED Medications  iohexol (OMNIPAQUE) 300 MG/ML solution 100 mL (100 mLs Intravenous Contrast Given 07/25/23 0122)  fosfomycin (MONUROL) packet 3 g (3 g Oral Given 07/25/23 0237)  amoxicillin-clavulanate (AUGMENTIN) 875-125 MG per tablet 1 tablet (1 tablet Oral Given 07/25/23 0237)                                                                                                                                     Procedures Procedures  (including critical care time)  Medical Decision Making / ED Course   This patient presents to the ED for  concern of abdominal pain, this involves an extensive number of treatment options, and is a complaint that carries with it a high risk of complications and morbidity.  The differential diagnosis includes diverticulitis, epiploic appendagitis, colitis, gastroenteritis, constipation, nephrolithiasis, inflammatory bowel disease,   MDM: Patient seen emergency room for evaluation of abdominal pain.  Physical exam with tenderness over the suprapubic region and left lower quadrant but is otherwise unremarkable.  Laboratory evaluation with no significant leukocytosis, urinalysis with trace leuk esterase, 11-20 white blood cells and many bacteria.  Urine culture sent.  CT abdomen pelvis with acute uncomplicated sigmoid diverticulitis, pathologic bilateral common and external iliac lymphadenopathy that is  progressed concerning for CLL.  I had an extended discussion with the patient about her CT findings and she states that she has had similar things investigated in the past including a biopsy but this has been almost 10 years ago.  I placed an outpatient oncologic referral and she states she will call to make an appointment.  UTI treated with fosfomycin and patient will start on Augmentin for her diverticulitis.  At this time she does not meet inpatient criteria for admission and will be discharged with outpatient follow-up.  Return precautions given which she voiced understanding.   Additional history obtained:  -External records from outside source obtained and reviewed including: Chart review including previous notes, labs, imaging, consultation notes   Lab Tests: -I ordered, reviewed, and interpreted labs.   The pertinent results include:   Labs Reviewed  COMPREHENSIVE METABOLIC PANEL - Abnormal; Notable for the following components:      Result Value   Glucose, Bld 163 (*)    All other components within normal limits  URINALYSIS, ROUTINE W REFLEX MICROSCOPIC - Abnormal; Notable for the following components:   APPearance HAZY (*)    Leukocytes,Ua TRACE (*)    Bacteria, UA MANY (*)    All other components within normal limits  URINE CULTURE  LIPASE, BLOOD  CBC      Imaging Studies ordered: I ordered imaging studies including CTAP I independently visualized and interpreted imaging. I agree with the radiologist interpretation   Medicines ordered and prescription drug management: Meds ordered this encounter  Medications   iohexol (OMNIPAQUE) 300 MG/ML solution 100 mL   fosfomycin (MONUROL) packet 3 g   DISCONTD: amoxicillin-clavulanate (AUGMENTIN) 875-125 MG per tablet 1 tablet   amoxicillin-clavulanate (AUGMENTIN) 875-125 MG per tablet 1 tablet   DISCONTD: amoxicillin-clavulanate (AUGMENTIN) 875-125 MG tablet    Sig: Take 1 tablet by mouth every 12 (twelve) hours.     Dispense:  14 tablet    Refill:  0   amoxicillin-clavulanate (AUGMENTIN) 875-125 MG tablet    Sig: Take 1 tablet by mouth every 12 (twelve) hours for 5 days.    Dispense:  10 tablet    Refill:  0    -I have reviewed the patients home medicines and have made adjustments as needed  Critical interventions none    Cardiac Monitoring: The patient was maintained on a cardiac monitor.  I personally viewed and interpreted the cardiac monitored which showed an underlying rhythm of: NSR  Social Determinants of Health:  Factors impacting patients care include: none   Reevaluation: After the interventions noted above, I reevaluated the patient and found that they have :improved  Co morbidities that complicate the patient evaluation  Past Medical History:  Diagnosis Date   Hyperlipidemia    Hypertension    Prediabetes    Renal calculi  Dispostion: I considered admission for this patient, but at this time she does not meet inpatient criteria for admission and will be discharged with outpatient follow-up     Final Clinical Impression(s) / ED Diagnoses Final diagnoses:  Diverticulitis  Lymphadenopathy     @PCDICTATION @    Glendora Score, MD 07/25/23 [phone]

## 2023-07-25 NOTE — Discharge Instructions (Addendum)
You were seen in the emergency room for evaluation of abdominal pain.  Your workup is concerning for a possible urinary infection and does show evidence of diverticulitis which is likely the source of your intermittent abdominal pain.  However, on the CT scan that was ordered there are lymph nodes that appear to be enlarging that may be concerning for a slow-growing process called chronically lymphocytic leukemia.  Is important to follow-up at the cancer center for this to be sure that this is properly addressed.  Please call and set up an appointment.

## 2023-07-25 NOTE — ED Notes (Signed)
 Patient transported to CT

## 2023-07-27 LAB — URINE CULTURE: Culture: 60000 — AB

## 2023-07-28 ENCOUNTER — Inpatient Hospital Stay: Payer: PPO | Attending: Oncology | Admitting: Oncology

## 2023-07-28 ENCOUNTER — Inpatient Hospital Stay: Payer: PPO

## 2023-07-28 VITALS — BP 138/84 | HR 70 | Temp 98.1°F | Resp 18 | Wt 225.2 lb

## 2023-07-28 DIAGNOSIS — R197 Diarrhea, unspecified: Secondary | ICD-10-CM | POA: Diagnosis not present

## 2023-07-28 DIAGNOSIS — R590 Localized enlarged lymph nodes: Secondary | ICD-10-CM | POA: Diagnosis not present

## 2023-07-28 DIAGNOSIS — Z7984 Long term (current) use of oral hypoglycemic drugs: Secondary | ICD-10-CM | POA: Diagnosis not present

## 2023-07-28 DIAGNOSIS — R591 Generalized enlarged lymph nodes: Secondary | ICD-10-CM | POA: Insufficient documentation

## 2023-07-28 DIAGNOSIS — K5732 Diverticulitis of large intestine without perforation or abscess without bleeding: Secondary | ICD-10-CM | POA: Diagnosis not present

## 2023-07-28 LAB — CBC WITH DIFFERENTIAL/PLATELET
Abs Immature Granulocytes: 0.02 K/uL (ref 0.00–0.07)
Basophils Absolute: 0.1 K/uL (ref 0.0–0.1)
Basophils Relative: 1 %
Eosinophils Absolute: 0.2 K/uL (ref 0.0–0.5)
Eosinophils Relative: 3 %
HCT: 43.4 % (ref 36.0–46.0)
Hemoglobin: 14 g/dL (ref 12.0–15.0)
Immature Granulocytes: 0 %
Lymphocytes Relative: 27 %
Lymphs Abs: 1.6 K/uL (ref 0.7–4.0)
MCH: 29.4 pg (ref 26.0–34.0)
MCHC: 32.3 g/dL (ref 30.0–36.0)
MCV: 91 fL (ref 80.0–100.0)
Monocytes Absolute: 0.6 K/uL (ref 0.1–1.0)
Monocytes Relative: 10 %
Neutro Abs: 3.5 K/uL (ref 1.7–7.7)
Neutrophils Relative %: 59 %
Platelets: 202 K/uL (ref 150–400)
RBC: 4.77 MIL/uL (ref 3.87–5.11)
RDW: 13.2 % (ref 11.5–15.5)
WBC: 5.9 K/uL (ref 4.0–10.5)
nRBC: 0 % (ref 0.0–0.2)

## 2023-07-28 LAB — URIC ACID: Uric Acid, Serum: 6.3 mg/dL (ref 2.5–7.1)

## 2023-07-28 LAB — LACTATE DEHYDROGENASE: LDH: 111 U/L (ref 98–192)

## 2023-07-28 NOTE — Assessment & Plan Note (Signed)
Patient with a history of lymphadenopathy with recent increase on recent CT scan.  Previous core biopsy negative for lymphoma.  Patient has no B symptoms.  Lymphadenopathy likely reactive from sigmoid diverticulosis. -Order LDH, uric acid, flow cytometry with CBC today to rule out lymphoma. -Repeat CT scan in 3 months to reassess for the size of lymphadenopathy.

## 2023-07-28 NOTE — Progress Notes (Signed)
[facility] at [facility] HEMATOLOGY NEW VISIT  Babs Sciara, MD  REASON FOR REFERRAL: Lymphadenopathy  HEMATOLOGY HISTORY: -11/26/2013: CT abdomen and pelvis wo contrast : 1. Bilateral pelvic adenopathy could be reactive but raises concern  for neoplasm. No obvious uterine or ovarian contour abnormality.  Lymphoma is not excluded. Workup might include biopsy, nuclear medicine PET-CT, or followup CT in order to ensure resolution if  there is a high clinical suspicion of these are simply reactive  lymph nodes.  -12/25/13: PET CT: 1. Low metabolic activity (less than or equal blood pool) of minimally enlarged iliac lymph nodes is NOT consistent with  high-grade lymphoma. Cannot exclude a low-grade lymphoma such as  chronic lymphocytic leukemia.  -01/02/2014: Lymph node, needle/core biopsy, right inguinal: Nonsuppurative granulomatous lymphadenitis.  No morphologic evidence of lymphoma or malignancy.   - 07/25/23: CT Abdomen and pelvis w contrast: 1. Acute, uncomplicated sigmoid diverticulitis. Superimposed severe sigmoid diverticulosis. 2. Pathologic bilateral common and external iliac lymphadenopathy, progressive since prior examination. This would be an unusual pattern for superimpose reactive adenopathy and interval progression of disease since prior examination of 04/26/2022 is favored in keeping with a indolent lymphoproliferative process such as chronic lymphocytic leukemia.  HISTORY OF PRESENT ILLNESS: Patricia Fuller 73 y.o. female referred for lymphadenopathy.  Patient was recently in the ER with abdominal pain and CT scan at this time showed sigmoid diverticulosis.  It also revealed increase in size of bilateral inguinal lymphadenopathy.  Patient did have a history of lymphadenopathy and had a core biopsy done in 2015 which revealed no lymphoma.She denies current nausea, vomiting, and abdominal pain. She reports occasional back pain, which has improved since starting  medication. She denies fever, chills, night sweats, unintended weight loss, and fatigue. She reports a good appetite and denies sleep troubles. She reports occasional diarrhea, which has improved.  Overall patient is well and is at baseline.  She is preparing for cruise trip this evening.  She is a non-smoker, does not drink alcohol.  Lives in [place] with her daughter and grandson.  She denies a family history of cancer.  I have reviewed the past medical history, past surgical history, social history and family history with the patient   ALLERGIES:  has no known allergies.  MEDICATIONS:  Current Outpatient Medications  Medication Sig Dispense Refill   alendronate (FOSAMAX) 70 MG tablet Take 1 tablet (70 mg total) by mouth every 7 (seven) days. Take with a full glass of water on an empty stomach. 4 tablet 11   amoxicillin-clavulanate (AUGMENTIN) 875-125 MG tablet Take 1 tablet by mouth every 12 (twelve) hours for 5 days. 10 tablet 0   cetirizine (ZYRTEC) 10 MG tablet Take 10 mg by mouth daily as needed for allergies.     ipratropium (ATROVENT) 0.06 % nasal spray Place 2 sprays into both nostrils 4 (four) times daily as needed for rhinitis. 15 mL 0   losartan (COZAAR) 50 MG tablet Take 1 tablet (50 mg total) by mouth every morning. 90 tablet 1   metFORMIN (GLUCOPHAGE) 500 MG tablet Take 0.5 tablets (250 mg total) by mouth 2 (two) times daily with a meal. 90 tablet 1   rosuvastatin (CRESTOR) 10 MG tablet Take 1 tablet (10 mg total) by mouth daily. 90 tablet 1   No current facility-administered medications for this visit.     REVIEW OF SYSTEMS:   Constitutional: Denies fevers, chills or night sweats Eyes: Denies blurriness of vision Ears, nose, mouth, throat, and  face: Denies mucositis or sore throat Respiratory: Denies cough, dyspnea or wheezes Cardiovascular: Denies palpitation, chest discomfort or lower extremity swelling Gastrointestinal:  Denies nausea, heartburn or change in bowel  habits Skin: Denies abnormal skin rashes Lymphatics: Denies new lymphadenopathy or easy bruising Neurological:Denies numbness, tingling or new weaknesses Behavioral/Psych: Mood is stable, no new changes  All other systems were reviewed with the patient and are negative.  PHYSICAL EXAMINATION:   Vitals:   07/28/23 0821  BP: 138/84  Pulse: 70  Resp: 18  Temp: 98.1 F (36.7 C)  SpO2: 95%    GENERAL:alert, no distress and comfortable LYMPH:  no palpable lymphadenopathy in the cervical, axillary or inguinal LUNGS: clear to auscultation and percussion with normal breathing effort HEART: regular rate & rhythm and no murmurs and no lower extremity edema ABDOMEN:abdomen soft, non-tender and normal bowel sounds Musculoskeletal:no cyanosis of digits and no clubbing  NEURO: alert & oriented x 3 with fluent speech  LABORATORY DATA:  I have reviewed the data as listed  Lab Results  Component Value Date   WBC 5.9 07/28/2023   NEUTROABS 3.5 07/28/2023   HGB 14.0 07/28/2023   HCT 43.4 07/28/2023   MCV 91.0 07/28/2023   PLT 202 07/28/2023        Chemistry      Component Value Date/Time   NA 138 07/24/2023 2046   NA 143 05/15/2023 0821   K 3.6 07/24/2023 2046   CL 106 07/24/2023 2046   CO2 24 07/24/2023 2046   BUN 13 07/24/2023 2046   BUN 17 05/15/2023 0821   CREATININE 0.89 07/24/2023 2046   CREATININE 0.83 08/25/2012 0902      Component Value Date/Time   CALCIUM 9.3 07/24/2023 2046   ALKPHOS 53 07/24/2023 2046   AST 16 07/24/2023 2046   ALT 16 07/24/2023 2046   BILITOT 0.7 07/24/2023 2046   BILITOT 0.4 05/15/2023 1610       RADIOGRAPHIC STUDIES: I have personally reviewed the radiological images as listed and agreed with the findings in the report.  CT ABDOMEN PELVIS W CONTRAST CLINICAL DATA:  Left lower quadrant abdominal pain  EXAM: CT ABDOMEN AND PELVIS WITH CONTRAST  TECHNIQUE: Multidetector CT imaging of the abdomen and pelvis was performed using the  standard protocol following bolus administration of intravenous contrast.  RADIATION DOSE REDUCTION: This exam was performed according to the departmental dose-optimization program which includes automated exposure control, adjustment of the mA and/or kV according to patient size and/or use of iterative reconstruction technique.  CONTRAST:  OMNIPAQUE IOHEXOL 300 MG/ML  SOLN  COMPARISON:  04/26/2022  FINDINGS: Lower chest: No acute abnormality.  Small hiatal hernia  Hepatobiliary: Cholelithiasis without pericholecystic inflammatory change. Liver unremarkable. No intra or extrahepatic biliary ductal dilation.  Pancreas: Unremarkable  Spleen: Unremarkable  Adrenals/Urinary Tract: The adrenal glands are unremarkable. The kidneys are normal in size and position. 2 mm punctate nonobstructing calculus noted within the interpolar region of the left kidney. Multiple bilateral parapelvic cysts are identified for which no follow-up imaging is recommended. The kidneys otherwise unremarkable. The bladder is.  Stomach/Bowel: Severe sigmoid diverticulosis. Superimposed circumferential bowel wall thickening and pericolonic inflammatory stranding is present in keeping acute, uncomplicated sigmoid diverticulitis best seen on image # 69/2 and 60/4. No evidence of obstruction. No free intraperitoneal gas or fluid. No loculated intra-abdominal fluid collections. Stomach, small bowel, and large bowel are otherwise unremarkable. Appendix normal.  Vascular/Lymphatic: Moderate aortoiliac atherosclerotic calcification. Retroaortic left renal vein. No aortic aneurysm. There is pathologic bilateral  common external iliac lymphadenopathy with the index lymph node within the right external iliac lymph node group measuring 20 mm in short axis diameter at axial image # 74/2.  Reproductive: Uterus and bilateral adnexa are unremarkable.  Other: No abdominal wall hernia  Musculoskeletal: Osseous  structures are age-appropriate. No acute bone abnormality.  IMPRESSION: 1. Acute, uncomplicated sigmoid diverticulitis. Superimposed severe sigmoid diverticulosis. 2. Pathologic bilateral common and external iliac lymphadenopathy, progressive since prior examination. This would be an unusual pattern for superimpose reactive adenopathy and interval progression of disease since prior examination of 04/26/2022 is favored in keeping with a indolent lymphoproliferative process such as chronic lymphocytic leukemia. 3. Cholelithiasis. 4. Minimal left nonobstructing nephrolithiasis.  Aortic Atherosclerosis (ICD10-I70.0).  Electronically Signed   By: Helyn Numbers M.D.   On: 07/25/2023 02:10   ASSESSMENT & PLAN:  Patient is a 73 year old female referred for recent increase in lymphadenopathy on CT scan.   Lymphadenopathy Patient with a history of lymphadenopathy with recent increase on recent CT scan.  Previous core biopsy negative for lymphoma.  Patient has no B symptoms.  Lymphadenopathy likely reactive from sigmoid diverticulosis. -Order LDH, uric acid, flow cytometry with CBC today to rule out lymphoma. -Repeat CT scan in 3 months to reassess for the size of lymphadenopathy.   Orders Placed This Encounter  Procedures   CT ABDOMEN PELVIS W CONTRAST    Standing Status:   Future    Expected Date:   10/25/2023    Expiration Date:   07/27/2024    If indicated for the ordered procedure, I authorize the administration of contrast media per Radiology protocol:   Yes    Does the patient have a contrast media/X-ray dye allergy?:   No    Preferred imaging location?:   [facility]    Release to patient:   Immediate [1]    If indicated for the ordered procedure, I authorize the administration of oral contrast media per Radiology protocol:   Yes   CBC with Differential    Standing Status:   Future    Number of Occurrences:   1    Expected Date:   07/28/2023    Expiration Date:    07/27/2024   Lactate dehydrogenase    Standing Status:   Future    Number of Occurrences:   1    Expected Date:   07/28/2023    Expiration Date:   07/27/2024   Uric acid    Standing Status:   Future    Number of Occurrences:   1    Expected Date:   07/28/2023    Expiration Date:   07/27/2024   Flow Cytometry, Peripheral Blood (Oncology)    Standing Status:   Future    Number of Occurrences:   1    Expected Date:   07/28/2023    Expiration Date:   07/27/2024    The total time spent in the appointment was 60 minutes encounter with patients including review of chart and various tests results, discussions about plan of care and coordination of care plan   All questions were answered. The patient knows to call the clinic with any problems, questions or concerns. No barriers to learning was detected.   Cindie Crumbly, MD 2/21/202512:00 PM

## 2023-07-28 NOTE — Patient Instructions (Addendum)
[facility] - [facility]  Discharge Instructions  You were seen and examined today by Dr. Anders Simmonds. Dr. Anders Simmonds is a medical oncologist, meaning that he specializes in the treatment of cancer diagnoses. Dr. Anders Simmonds discussed your past medical history, family history of cancers, and the events that led to you being here today.  You were referred to Dr. Anders Simmonds due to an abnormal CT scan which revealed enlarged lymph nodes. Lymph nodes are typically enlarged in the presence of cancer and inflammation. On your recent CT scan, there was inflammation noted in the bowel which is in the general location of the enlarged lymph nodes. You have no symptoms of cancer nor is there any concerning findings on your blood work in the emergency department. Most likely, the enlarged lymph nodes are consistent with inflammation in the body.  Dr. Anders Simmonds has recommended labs today to rule out any concerns for lymphoma or leukemia. Assuming those are normal, we will repeat your CT scan in 3 months and follow-up with you then.  Follow-up as scheduled.  Thank you for choosing [facility] to provide your oncology and hematology care.   To afford each patient quality time with our provider, please arrive at least 15 minutes before your scheduled appointment time. You may need to reschedule your appointment if you arrive late (10 or more minutes). Arriving late affects you and other patients whose appointments are after yours.  Also, if you miss three or more appointments without notifying the office, you may be dismissed from the clinic at the provider's discretion.    Again, thank you for choosing [facility].  Our hope is that these requests will decrease the amount of time that you wait before being seen by our physicians.   If you have a lab appointment with the Cancer Center - please note that after April 8th, all labs will be drawn in the cancer center.  You do not  have to check in or register with the main entrance as you have in the past but will complete your check-in at the cancer center.            _____________________________________________________________  Should you have questions after your visit to [facility], please contact our office at [phone] and follow the prompts.  Our office hours are 8:00 a.m. to 4:30 p.m. Monday - Thursday and 8:00 a.m. to 2:30 p.m. Friday.  Please note that voicemails left after 4:00 p.m. may not be returned until the following business day.  We are closed weekends and all major holidays.  You do have access to a nurse 24-7, just call the main number to the clinic [phone] and do not press any options, hold on the line and a nurse will answer the phone.    For prescription refill requests, have your pharmacy contact our office and allow 72 hours.    Masks are no longer required in the cancer centers. If you would like for your care team to wear a mask while they are taking care of you, please let them know. You may have one support person who is at least 71 years old accompany you for your appointments.

## 2023-08-04 LAB — SURGICAL PATHOLOGY

## 2023-08-09 LAB — FLOW CYTOMETRY

## 2023-08-18 ENCOUNTER — Ambulatory Visit (INDEPENDENT_AMBULATORY_CARE_PROVIDER_SITE_OTHER): Payer: PPO

## 2023-08-18 VITALS — Ht 66.0 in | Wt 220.0 lb

## 2023-08-18 DIAGNOSIS — Z0000 Encounter for general adult medical examination without abnormal findings: Secondary | ICD-10-CM

## 2023-08-18 DIAGNOSIS — Z1211 Encounter for screening for malignant neoplasm of colon: Secondary | ICD-10-CM

## 2023-08-18 DIAGNOSIS — Z5320 Procedure and treatment not carried out because of patient's decision for unspecified reasons: Secondary | ICD-10-CM

## 2023-08-18 NOTE — Patient Instructions (Signed)
 Patricia Fuller , Thank you for taking time to come for your Medicare Wellness Visit. I appreciate your ongoing commitment to your health goals. Please review the following plan we discussed and let me know if I can assist you in the future.   Referrals/Orders/Follow-Ups/Clinician Recommendations:  Next Medicare Annual Wellness Visit:   August 23, 2024 at 10:00 am telephone visit.   You have been referred to [facility].If you haven't heard from them within the next week, please call them to schedule your appointment.    Address: [address], [place], [place] [address] Phone: [phone]  If you are unable to login to your Mychart, please call [phone] for assistance.    This is a list of the screening recommended for you and due dates:  Health Maintenance  Topic Date Due   Pneumonia Vaccine (1 of 2 - PCV) Never done   Zoster (Shingles) Vaccine (1 of 2) Never done   Mammogram  Never done   Colon Cancer Screening  10/20/2023   Flu Shot  09/04/2023*   Hemoglobin A1C  11/13/2023   Yearly kidney health urinalysis for diabetes  11/15/2023   Complete foot exam   11/16/2023   Eye exam for diabetics  11/30/2023   DEXA scan (bone density measurement)  05/23/2024   Yearly kidney function blood test for diabetes  07/23/2024   Medicare Annual Wellness Visit  08/17/2024   Hepatitis C Screening  Completed   HPV Vaccine  Aged Out   DTaP/Tdap/Td vaccine  Discontinued   COVID-19 Vaccine  Discontinued  *Topic was postponed. The date shown is not the original due date.    Advanced directives: (Declined) Advance directive discussed with you today. Even though you declined this today, please call our office should you change your mind, and we can give you the proper paperwork for you to fill out.  Next Medicare Annual Wellness Visit scheduled for next year: yes  Understanding Your Risk for Falls Millions of people have serious injuries from falls each year. It is important to  understand your risk of falling. Talk with your health care provider about your risk and what you can do to lower it. If you do have a serious fall, make sure to tell your provider. Falling once raises your risk of falling again. How can falls affect me? Serious injuries from falls are common. These include: Broken bones, such as hip fractures. Head injuries, such as traumatic brain injuries (TBI) or concussions. A fear of falling can cause you to avoid activities and stay at home. This can make your muscles weaker and raise your risk for a fall. What can increase my risk? There are a number of risk factors that increase your risk for falling. The more risk factors you have, the higher your risk of falling. Serious injuries from a fall happen most often to people who are older than 71 years old. Teenagers and young adults ages 74-29 are also at higher risk. Common risk factors include: Weakness in the lower body. Being generally weak or confused due to long-term (chronic) illness. Dizziness or balance problems. Poor vision. Medicines that cause dizziness or drowsiness. These may include: Medicines for your blood pressure, heart, anxiety, insomnia, or swelling (edema). Pain medicines. Muscle relaxants. Other risk factors include: Drinking alcohol. Having had a fall in the past. Having foot pain or wearing improper footwear. Working at a dangerous job. Having any of the following in your home: Tripping hazards, such as floor clutter or loose rugs. Poor  lighting. Pets. Having dementia or memory loss. What actions can I take to lower my risk of falling?     Physical activity Stay physically fit. Do strength and balance exercises. Consider taking a regular class to build strength and balance. Yoga and tai chi are good options. Vision Have your eyes checked every year and your prescription for glasses or contacts updated as needed. Shoes and walking aids Wear non-skid shoes. Wear  shoes that have rubber soles and low heels. Do not wear high heels. Do not walk around the house in socks or slippers. Use a cane or walker as told by your provider. Home safety Attach secure railings on both sides of your stairs. Install grab bars for your bathtub, shower, and toilet. Use a non-skid mat in your bathtub or shower. Attach bath mats securely with double-sided, non-slip rug tape. Use good lighting in all rooms. Keep a flashlight near your bed. Make sure there is a clear path from your bed to the bathroom. Use night-lights. Do not use throw rugs. Make sure all carpeting is taped or tacked down securely. Remove all clutter from walkways and stairways, including extension cords. Repair uneven or broken steps and floors. Avoid walking on icy or slippery surfaces. Walk on the grass instead of on icy or slick sidewalks. Use ice melter to get rid of ice on walkways in the winter. Use a cordless phone. Questions to ask your health care provider Can you help me check my risk for a fall? Do any of my medicines make me more likely to fall? Should I take a vitamin D supplement? What exercises can I do to improve my strength and balance? Should I make an appointment to have my vision checked? Do I need a bone density test to check for weak bones (osteoporosis)? Would it help to use a cane or a walker? Where to find more information Centers for Disease Control and Prevention, STEADI: TonerPromos.no Community-Based Fall Prevention Programs: TonerPromos.no [employer] on Aging: BaseRingTones.pl Contact a health care provider if: You fall at home. You are afraid of falling at home. You feel weak, drowsy, or dizzy. This information is not intended to replace advice given to you by your health care provider. Make sure you discuss any questions you have with your health care provider. Document Revised: 01/24/2022 Document Reviewed: 01/24/2022 Elsevier Patient Education  2024 [employer].

## 2023-08-18 NOTE — Progress Notes (Signed)
 Please attest and cosign this visit due to patients primary care provider not being in the office at the time the visit was completed.   Because this visit was a virtual/telehealth visit,  certain criteria was not obtained, such a blood pressure, CBG if applicable, and timed get up and go. Any medications not marked as "taking" were not mentioned during the medication reconciliation part of the visit. Any vitals not documented were not able to be obtained due to this being a telehealth visit or patient was unable to self-report a recent blood pressure reading due to a lack of equipment at home via telehealth. Vitals that have been documented are verbally provided by the patient.   Subjective:   Patricia Fuller is a 73 y.o. who presents for a Medicare Wellness preventive visit.  Visit Complete: Virtual I connected with  Patricia Fuller on 08/18/23 by a audio enabled telemedicine application and verified that I am speaking with the correct person using two identifiers.  Patient Location: Home  Provider Location: Home Office  I discussed the limitations of evaluation and management by telemedicine. The patient expressed understanding and agreed to proceed.  Vital Signs: Because this visit was a virtual/telehealth visit, some criteria may be missing or patient reported. Any vitals not documented were not able to be obtained and vitals that have been documented are patient reported.  VideoDeclined- This patient declined Librarian, academic. Therefore the visit was completed with audio only.  Persons Participating in Visit: Patient.  AWV Questionnaire: No: Patient Medicare AWV questionnaire was not completed prior to this visit.  Cardiac Risk Factors include: advanced age (>79men, >15 women);diabetes mellitus;dyslipidemia;hypertension;obesity (BMI >30kg/m2);sedentary lifestyle     Objective:    Today's Vitals   08/18/23 1126 08/18/23 1128  Weight: 220 lb (99.8 kg)    Height: 5\' 6"  (1.676 m)   PainSc:  0-No pain   Body mass index is 35.51 kg/m.     08/18/2023   11:30 AM 07/28/2023    8:21 AM 07/24/2023    8:11 PM 08/12/2022    9:17 AM 04/26/2022    6:58 PM 08/03/2021    8:24 AM 10/19/2020    7:28 AM  Advanced Directives  Does Patient Have a Medical Advance Directive? No No No No No No No  Does patient want to make changes to medical advance directive?    No - Patient declined     Would patient like information on creating a medical advance directive? No - Patient declined No - Patient declined No - Patient declined  No - Patient declined No - Patient declined Yes (MAU/Ambulatory/Procedural Areas - Information given)    Current Medications (verified) Outpatient Encounter Medications as of 08/18/2023  Medication Sig   alendronate (FOSAMAX) 70 MG tablet Take 1 tablet (70 mg total) by mouth every 7 (seven) days. Take with a full glass of water on an empty stomach.   cetirizine (ZYRTEC) 10 MG tablet Take 10 mg by mouth daily as needed for allergies.   ipratropium (ATROVENT) 0.06 % nasal spray Place 2 sprays into both nostrils 4 (four) times daily as needed for rhinitis.   losartan (COZAAR) 50 MG tablet Take 1 tablet (50 mg total) by mouth every morning.   metFORMIN (GLUCOPHAGE) 500 MG tablet Take 0.5 tablets (250 mg total) by mouth 2 (two) times daily with a meal.   rosuvastatin (CRESTOR) 10 MG tablet Take 1 tablet (10 mg total) by mouth daily.   No facility-administered encounter medications on  file as of 08/18/2023.    Allergies (verified) Patient has no known allergies.   History: Past Medical History:  Diagnosis Date   Hyperlipidemia    Hypertension    Prediabetes    Renal calculi    Past Surgical History:  Procedure Laterality Date   BIOPSY  10/19/2020   Procedure: BIOPSY;  Surgeon: Lanelle Bal, DO;  [facility]: [facility];  Service: Endoscopy;;  transverse colon polyp   COLONOSCOPY WITH PROPOFOL N/A 10/19/2020   Procedure:  COLONOSCOPY WITH PROPOFOL;  Surgeon: Lanelle Bal, DO;  Location: [facility];  Service: Endoscopy;  Laterality: N/A;  am appt   LITHOTRIPSY     POLYPECTOMY  10/19/2020   Procedure: POLYPECTOMY;  Surgeon: Lanelle Bal, DO;  Location: [facility];  Service: Endoscopy;;  transverse colon polyp, descending colon polyp, sigmoid colon polyp    TUBAL LIGATION     Family History  Problem Relation Age of Onset   Hypertension Mother    Diabetes Mother    Heart disease Mother    Colon cancer Neg Hx    Social History   Socioeconomic History   Marital status: Single    Spouse name: Not on file   Number of children: Not on file   Years of education: Not on file   Highest education level: Not on file  Occupational History   Not on file  Tobacco Use   Smoking status: Never   Smokeless tobacco: Never  Vaping Use   Vaping status: Never Used  Substance and Sexual Activity   Alcohol use: No   Drug use: No   Sexual activity: Never  Other Topics Concern   Not on file  Social History Narrative   Not on file   Social Drivers of Health   Financial Resource Strain: Low Risk  (08/18/2023)   Overall Financial Resource Strain (CARDIA)    Difficulty of Paying Living Expenses: Not hard at all  Food Insecurity: No Food Insecurity (08/18/2023)   Hunger Vital Sign    Worried About Running Out of Food in the Last Year: Never true    Ran Out of Food in the Last Year: Never true  Transportation Needs: No Transportation Needs (08/18/2023)   PRAPARE - Administrator, Civil Service (Medical): No    Lack of Transportation (Non-Medical): No  Physical Activity: Inactive (08/18/2023)   Exercise Vital Sign    Days of Exercise per Week: 0 days    Minutes of Exercise per Session: 0 min  Stress: No Stress Concern Present (08/18/2023)   [employer] of Occupational Health - Occupational Stress Questionnaire    Feeling of Stress : Not at all  Social Connections: Socially Isolated  (08/18/2023)   Social Connection and Isolation Panel [NHANES]    Frequency of Communication with Friends and Family: More than three times a week    Frequency of Social Gatherings with Friends and Family: More than three times a week    Attends Religious Services: Never    Database administrator or Organizations: No    Attends Engineer, structural: Never    Marital Status: Divorced    Tobacco Counseling Counseling given: Not Answered    Clinical Intake:  Pre-visit preparation completed: Yes  Pain : No/denies pain Pain Score: 0-No pain     BMI - recorded: 35.51 Nutritional Status: BMI > 30  Obese Nutritional Risks: None Diabetes: Yes CBG done?: No (telehealth visit.) Did pt. bring in CBG monitor  from home?: No  How often do you need to have someone help you when you read instructions, pamphlets, or other written materials from your doctor or pharmacy?: 1 - Never  Interpreter Needed?: No  Information entered by :: Maryjean Ka CMA   Activities of Daily Living     08/18/2023   11:31 AM  In your present state of health, do you have any difficulty performing the following activities:  Hearing? 0  Vision? 0  Difficulty concentrating or making decisions? 0  Walking or climbing stairs? 0  Dressing or bathing? 0  Doing errands, shopping? 0  Preparing Food and eating ? N  Using the Toilet? N  In the past six months, have you accidently leaked urine? N  Do you have problems with loss of bowel control? N  Managing your Medications? N  Managing your Finances? N  Housekeeping or managing your Housekeeping? N    Patient Care Team: Babs Sciara, MD as PCP - General (Family Medicine) Lanelle Bal, DO as Consulting Physician (Gastroenterology) Cindie Crumbly, MD as Medical Oncologist (Oncology)  Indicate any recent Medical Services you may have received from other than [facility] providers in the past year (date may be approximate).     Assessment:   This is a  routine wellness examination for [facility].  Hearing/Vision screen Hearing Screening - Comments:: Patient denies any hearing difficulties.   Vision Screening - Comments:: Wears rx glasses - up to date with routine eye exams  Patient sees Dr. Daisy Lazar w/ [facility] [place] office.     Goals Addressed             This Visit's Progress    Exercise 3x per week (30 min per time)   On track    Continue exercise and eat helathy     Remain active and independent   On track      Depression Screen     08/18/2023   11:31 AM 06/22/2023    9:21 AM 05/18/2023    8:46 AM 11/24/2022   11:42 AM 11/16/2022    9:27 AM 08/12/2022    9:31 AM 06/17/2022    8:36 AM  PHQ 2/9 Scores  PHQ - 2 Score 0 0 0 0 0 0 0  PHQ- 9 Score 0 0 1 0       Fall Risk     08/18/2023   11:30 AM 06/22/2023    9:21 AM 11/24/2022   11:41 AM 11/16/2022    9:26 AM 08/12/2022    9:16 AM  Fall Risk   Falls in the past year? 0 0 0 0 0  Number falls in past yr: 0  0 0 0  Injury with Fall? 0  0 0 0  Risk for fall due to : No Fall Risks    No Fall Risks  Follow up Falls prevention discussed;Falls evaluation completed    Falls prevention discussed;Education provided;Falls evaluation completed    MEDICARE RISK AT HOME:  Medicare Risk at Home Any stairs in or around the home?: Yes If so, are there any without handrails?: No Home free of loose throw rugs in walkways, pet beds, electrical cords, etc?: Yes Adequate lighting in your home to reduce risk of falls?: Yes Life alert?: No Use of a cane, walker or w/c?: No Grab bars in the bathroom?: No Shower chair or bench in shower?: Yes (patient has but does not have to use it.) Elevated toilet seat or a handicapped toilet?: Yes  TIMED UP AND GO:  Was the test performed?  No  Cognitive Function: 6CIT completed        08/18/2023   11:29 AM 08/12/2022    9:17 AM 08/03/2021    8:26 AM  6CIT Screen  What Year? 0 points 0 points 0 points  What month? 0 points 0 points 0  points  What time? 0 points 0 points 0 points  Count back from 20 0 points 0 points 0 points  Months in reverse 0 points 0 points 0 points  Repeat phrase 0 points 0 points 2 points  Total Score 0 points 0 points 2 points    Immunizations  There is no immunization history on file for this patient.  Screening Tests Health Maintenance  Topic Date Due   Pneumonia Vaccine 1+ Years old (1 of 2 - PCV) Never done   Zoster Vaccines- Shingrix (1 of 2) Never done   MAMMOGRAM  Never done   Colonoscopy  10/20/2023   INFLUENZA VACCINE  09/04/2023 (Originally 01/05/2023)   HEMOGLOBIN A1C  11/13/2023   Diabetic kidney evaluation - Urine ACR  11/15/2023   FOOT EXAM  11/16/2023   OPHTHALMOLOGY EXAM  11/30/2023   DEXA SCAN  05/23/2024   Diabetic kidney evaluation - eGFR measurement  07/23/2024   Medicare Annual Wellness (AWV)  08/17/2024   Hepatitis C Screening  Completed   HPV VACCINES  Aged Out   DTaP/Tdap/Td  Discontinued   COVID-19 Vaccine  Discontinued    Health Maintenance  Health Maintenance Due  Topic Date Due   Pneumonia Vaccine 82+ Years old (1 of 2 - PCV) Never done   Zoster Vaccines- Shingrix (1 of 2) Never done   MAMMOGRAM  Never done   Colonoscopy  10/20/2023   Health Maintenance Items Addressed: Referral sent to GI for colonoscopy  Additional Screening:  Vision Screening: Recommended annual ophthalmology exams for early detection of glaucoma and other disorders of the eye.  Dental Screening: Recommended annual dental exams for proper oral hygiene  Community Resource Referral / Chronic Care Management: CRR required this visit?  No   CCM required this visit?  No     Plan:     I have personally reviewed and noted the following in the patient's chart:   Medical and social history Use of alcohol, tobacco or illicit drugs  Current medications and supplements including opioid prescriptions. Patient is not currently taking opioid prescriptions. Functional ability  and status Nutritional status Physical activity Advanced directives List of other physicians Hospitalizations, surgeries, and ER visits in previous 12 months Vitals Screenings to include cognitive, depression, and falls Referrals and appointments  In addition, I have reviewed and discussed with patient certain preventive protocols, quality metrics, and best practice recommendations. A written personalized care plan for preventive services as well as general preventive health recommendations were provided to patient.     Jordan Hawks Opal Dinning, CMA   08/18/2023   After Visit Summary: (Mail) Due to this being a telephonic visit, the after visit summary with patients personalized plan was offered to patient via mail   Notes: Nothing significant to report at this time.

## 2023-08-24 ENCOUNTER — Encounter: Payer: Self-pay | Admitting: *Deleted

## 2023-10-18 ENCOUNTER — Encounter (HOSPITAL_COMMUNITY): Payer: Self-pay

## 2023-10-18 ENCOUNTER — Encounter: Payer: Self-pay | Admitting: *Deleted

## 2023-10-20 ENCOUNTER — Ambulatory Visit (HOSPITAL_COMMUNITY)
Admission: RE | Admit: 2023-10-20 | Discharge: 2023-10-20 | Disposition: A | Discharge status: Home / Self Care | Payer: PPO | Source: Ambulatory Visit | Attending: Oncology | Admitting: Oncology

## 2023-10-20 ENCOUNTER — Encounter (HOSPITAL_COMMUNITY): Payer: Self-pay | Admitting: Radiology

## 2023-10-20 ENCOUNTER — Inpatient Hospital Stay: Payer: PPO

## 2023-10-20 DIAGNOSIS — N281 Cyst of kidney, acquired: Secondary | ICD-10-CM | POA: Diagnosis not present

## 2023-10-20 DIAGNOSIS — R590 Localized enlarged lymph nodes: Secondary | ICD-10-CM | POA: Diagnosis not present

## 2023-10-20 DIAGNOSIS — K449 Diaphragmatic hernia without obstruction or gangrene: Secondary | ICD-10-CM | POA: Diagnosis not present

## 2023-10-20 DIAGNOSIS — K5732 Diverticulitis of large intestine without perforation or abscess without bleeding: Secondary | ICD-10-CM | POA: Diagnosis not present

## 2023-10-20 DIAGNOSIS — K8020 Calculus of gallbladder without cholecystitis without obstruction: Secondary | ICD-10-CM | POA: Diagnosis not present

## 2023-10-20 LAB — POCT I-STAT CREATININE: Creatinine, Ser: 1 mg/dL (ref 0.44–1.00)

## 2023-10-20 MED ORDER — IOHEXOL 300 MG/ML  SOLN
100.0000 mL | Freq: Once | INTRAMUSCULAR | Status: AC | PRN
  Administered 2023-10-20: 100 mL via INTRAVENOUS

## 2023-10-27 ENCOUNTER — Inpatient Hospital Stay: Payer: PPO | Attending: Oncology | Admitting: Oncology

## 2023-10-27 VITALS — BP 138/83 | HR 82 | Temp 98.1°F | Resp 18 | Wt 229.0 lb

## 2023-10-27 DIAGNOSIS — R591 Generalized enlarged lymph nodes: Secondary | ICD-10-CM

## 2023-10-27 DIAGNOSIS — R590 Localized enlarged lymph nodes: Secondary | ICD-10-CM | POA: Insufficient documentation

## 2023-10-27 DIAGNOSIS — K5730 Diverticulosis of large intestine without perforation or abscess without bleeding: Secondary | ICD-10-CM | POA: Diagnosis not present

## 2023-10-27 NOTE — Assessment & Plan Note (Signed)
 Lymphadenopathy likely reactive from sigmoid diverticulosis. Patient with a history of lymphadenopathy with increase on CT scan.   Previous core biopsy negative for lymphoma.  Patient has no B symptoms.   LDH, uric acid : normal Flow cytometry: Negative Repeat CT scan showed improvement in lymphadenopathy  - Will repeat CT scan in 1 year - Instructed to report symptoms such as fever, chills, weight loss, anorexia, severe fatigue, or night sweats.  RTC in 1 year

## 2023-10-27 NOTE — Patient Instructions (Signed)
 VISIT SUMMARY:  Today, you came in for a follow-up appointment after your recent CT scan. The scan showed that your lymph nodes have improved and are now smaller. Blood tests were negative for lymphoma. You reported no symptoms such as fever, chills, weight loss, loss of appetite, severe fatigue, or night sweats. You occasionally experience constipation and asked about suitable laxatives. You also mentioned a past diagnosis of diverticulitis and have been cautious with your diet.  YOUR PLAN:  -LYMPHADENOPATHY: Lymphadenopathy means swollen lymph nodes. Your lymph nodes have improved and are now smaller, likely due to past colonic inflammation. Blood tests were negative for lymphoma. We will repeat the CT scan in one year. Please report any symptoms such as fever, chills, weight loss, loss of appetite, severe fatigue, or night sweats immediately.  -DIVERTICULITIS: Diverticulitis is an inflammation or infection of small pouches that can form in your intestines. Your past episode has resolved, and you currently have no symptoms. No follow-up is needed before one year unless new symptoms arise. You can take Colace for constipation.   INSTRUCTIONS:  Please schedule a follow-up appointment in one year and have a repeat CT scan before the appointment.

## 2023-10-27 NOTE — Progress Notes (Signed)
 [facility]  HEMATOLOGY FOLLOW-UP VISIT  Patricia Brasil, MD  REASON FOR FOLLOW-UP: Lymphadenopathy  ASSESSMENT & PLAN:  Patient is a 73 y.o. female following for lymphadenopathy  Lymphadenopathy Lymphadenopathy likely reactive from sigmoid diverticulosis. Patient with a history of lymphadenopathy with increase on CT scan.   Previous core biopsy negative for lymphoma.  Patient has no B symptoms.   LDH, uric acid : normal Flow cytometry: Negative Repeat CT scan showed improvement in lymphadenopathy  - Will repeat CT scan in 1 year - Instructed to report symptoms such as fever, chills, weight loss, anorexia, severe fatigue, or night sweats.  RTC in 1 year   Orders Placed This Encounter  Procedures   CT CHEST ABDOMEN PELVIS W CONTRAST    Standing Status:   Future    Expected Date:   10/04/2024    Expiration Date:   10/26/2024    If indicated for the ordered procedure, I authorize the administration of contrast media per Radiology protocol:   Yes    Does the patient have a contrast media/X-ray dye allergy?:   No    Preferred imaging location?:   [facility]    If indicated for the ordered procedure, I authorize the administration of oral contrast media per Radiology protocol:   Yes    The total time spent in the appointment was 20 minutes encounter with patients including review of chart and various tests results, discussions about plan of care and coordination of care plan   All questions were answered. The patient knows to call the clinic with any problems, questions or concerns. No barriers to learning was detected.  Patricia Grade, MD 5/23/20251:08 PM   INTERVAL HISTORY: Patricia Fuller 73 y.o. female following for lymphadenopathy. Her recent CT scan showed improvement in her lymph nodes, which are now smaller. Blood tests, including flow cytometry and marker tests, were negative for lymphoma.  No symptoms such as fever,  chills, weight loss, loss of appetite, severe fatigue, or night sweats.  I have reviewed the past medical history, past surgical history, social history and family history with the patient   ALLERGIES:  has no known allergies.  MEDICATIONS:  Current Outpatient Medications  Medication Sig Dispense Refill   alendronate  (FOSAMAX ) 70 MG tablet Take 1 tablet (70 mg total) by mouth every 7 (seven) days. Take with a full glass of water on an empty stomach. 4 tablet 11   cetirizine (ZYRTEC) 10 MG tablet Take 10 mg by mouth daily as needed for allergies.     ipratropium (ATROVENT ) 0.06 % nasal spray Place 2 sprays into both nostrils 4 (four) times daily as needed for rhinitis. 15 mL 0   losartan  (COZAAR ) 50 MG tablet Take 1 tablet (50 mg total) by mouth every morning. 90 tablet 1   metFORMIN  (GLUCOPHAGE ) 500 MG tablet Take 0.5 tablets (250 mg total) by mouth 2 (two) times daily with a meal. 90 tablet 1   rosuvastatin  (CRESTOR ) 10 MG tablet Take 1 tablet (10 mg total) by mouth daily. 90 tablet 1   No current facility-administered medications for this visit.     REVIEW OF SYSTEMS:   Constitutional: Denies fevers, chills or night sweats Eyes: Denies blurriness of vision Ears, nose, mouth, throat, and face: Denies mucositis or sore throat Respiratory: Denies cough, dyspnea or wheezes Cardiovascular: Denies palpitation, chest discomfort or lower extremity swelling Gastrointestinal:  Denies nausea, heartburn or change in bowel habits Skin: Denies abnormal skin rashes  Lymphatics: Denies new lymphadenopathy or easy bruising Neurological:Denies numbness, tingling or new weaknesses Behavioral/Psych: Mood is stable, no new changes  All other systems were reviewed with the patient and are negative.  PHYSICAL EXAMINATION:   Vitals:   10/27/23 0944  BP: 138/83  Pulse: 82  Resp: 18  Temp: 98.1 F (36.7 C)  SpO2: 97%    GENERAL:alert, no distress and comfortable SKIN: skin color, texture, turgor  are normal, no rashes or significant lesions LYMPH:  no palpable lymphadenopathy in the cervical, axillary or inguinal LUNGS: clear to auscultation and percussion with normal breathing effort HEART: regular rate & rhythm and no murmurs and no lower extremity edema ABDOMEN:abdomen soft, non-tender and normal bowel sounds Musculoskeletal:no cyanosis of digits and no clubbing  NEURO: alert & oriented x 3 with fluent speech  LABORATORY DATA:  I have reviewed the data as listed  Lab Results  Component Value Date   WBC 5.9 07/28/2023   NEUTROABS 3.5 07/28/2023   HGB 14.0 07/28/2023   HCT 43.4 07/28/2023   MCV 91.0 07/28/2023   PLT 202 07/28/2023       Chemistry      Component Value Date/Time   NA 138 07/24/2023 2046   NA 143 05/15/2023 0821   K 3.6 07/24/2023 2046   CL 106 07/24/2023 2046   CO2 24 07/24/2023 2046   BUN 13 07/24/2023 2046   BUN 17 05/15/2023 0821   CREATININE 1.00 10/20/2023 0942   CREATININE 0.83 08/25/2012 0902      Component Value Date/Time   CALCIUM  9.3 07/24/2023 2046   ALKPHOS 53 07/24/2023 2046   AST 16 07/24/2023 2046   ALT 16 07/24/2023 2046   BILITOT 0.7 07/24/2023 2046   BILITOT 0.4 05/15/2023 0821      Latest Reference Range & Units 07/28/23 08:59  Uric Acid, Serum 2.5 - 7.1 mg/dL 6.3  LDH 98 - 409 U/L 811   Flow cytometry: 07/28/2023:  DIAGNOSIS:   - No monoclonal B-cell, phenotypically aberrant T-cell, or distinct blast population identified    GATING AND PHENOTYPIC ANALYSIS:   Gated population: Flow cytometric immunophenotyping is performed using antibodies to the antigens listed in the table below. Electronic gates are placed around a cell cluster displaying light scatter properties corresponding to: lymphocytes   Abnormal Cells in gated population: N/A   Phenotype of Abnormal Cells: N/A   RADIOGRAPHIC STUDIES: I have personally reviewed the radiological images as listed and agreed with the findings in the report.  CT ABDOMEN  PELVIS W CONTRAST EXAMINATION: CT ABDOMEN PELVIS W CONTRAST  CLINICAL INDICATION: Female, 73 years old. abdominal lymphadenopathy monitor  TECHNIQUE: Axial CT of the abdomen and pelvis with 100 cc Omnipaque  300 intravenous contrast. Multiplanar reformations provided. Unless otherwise specified, incidental thyroid, adrenal, renal lesions do not require dedicated imaging follow up. Additionally, any mentioned pulmonary nodules do not require dedicated imaging follow-up based on the Fleischner guidelines unless otherwise specified. Coronary calcifications are not identified unless otherwise specified.  COMPARISON: 07/25/2023  FINDINGS:  There are minimal atelectatic changes in the lung bases. The heart is borderline enlarged. There is a small hiatal hernia. The liver. It is mildly enlarged. There is cholelithiasis. The spleen is normal. The pancreas is normal. Adrenals are normal. Bilateral peripelvic renal cysts are noted. There is a punctate nonobstructing left intrarenal stone. Abdominal aorta is normal in caliber. Scattered atherosclerotic changes are present. The urinary bladder is normal. The uterus is normal. There is colonic diverticulosis. The appendix is normal. Large and  small bowel loops are otherwise within normal limits. Common and external iliac chain adenopathy is improved. For instance there is a 1.3 cm short axis left common iliac chain lymph node previously measured 1.5 cm (series 2 image 58). Additionally there is a 1.3 cm right external iliac chain lymph node previously measuring 1.9 cm (series 2 image 79). There are degenerative changes of the spine with diffuse osseous demineralization.  IMPRESSION:  Common and external iliac chain adenopathy appears slightly improved compared to the prior study.  Interval resolution of sigmoid diverticulitis.  DOSE REDUCTION: This exam was performed according to our departmental dose-optimization program which includes  automated exposure control, adjustment of the mA and/or kV according to patient size and/or use of iterative reconstruction technique.  Electronically signed by: [place] Engel MD 10/20/2023 07:28 PM EDT RP Workstation: [id number]

## 2023-11-10 NOTE — Telephone Encounter (Unsigned)
 Copied from CRM [id number]. Topic: Clinical - Request for Lab/Test Order >> Nov 10, 2023  1:55 PM Fredrica W wrote: Reason for CRM: Patient called states she has appt 6/12. Is supposed to come Monday morning for blood work and was calling to see if order was in because last couple times it has not been in. No order showing. Thank You

## 2023-11-13 ENCOUNTER — Telehealth: Payer: Self-pay | Admitting: Family Medicine

## 2023-11-13 DIAGNOSIS — E1169 Type 2 diabetes mellitus with other specified complication: Secondary | ICD-10-CM

## 2023-11-13 DIAGNOSIS — Z79899 Other long term (current) drug therapy: Secondary | ICD-10-CM

## 2023-11-13 DIAGNOSIS — I10 Essential (primary) hypertension: Secondary | ICD-10-CM

## 2023-11-13 DIAGNOSIS — E119 Type 2 diabetes mellitus without complications: Secondary | ICD-10-CM

## 2023-11-13 DIAGNOSIS — E785 Hyperlipidemia, unspecified: Secondary | ICD-10-CM | POA: Diagnosis not present

## 2023-11-13 DIAGNOSIS — R739 Hyperglycemia, unspecified: Secondary | ICD-10-CM

## 2023-11-13 NOTE — Telephone Encounter (Signed)
 Blood work ordered in [employer]. Patient notified.

## 2023-11-13 NOTE — Telephone Encounter (Signed)
 Lipid, liver, metabolic 7, urine ACR, A1c Diagnosis type 2 diabetes, hyperlipidemia, high risk med, hypertension

## 2023-11-14 ENCOUNTER — Ambulatory Visit: Payer: Self-pay | Admitting: Family Medicine

## 2023-11-14 LAB — MICROALBUMIN / CREATININE URINE RATIO
Creatinine, Urine: 249.3 mg/dL
Microalb/Creat Ratio: 11 mg/g{creat} (ref 0–29)
Microalbumin, Urine: 28.2 ug/mL

## 2023-11-14 LAB — HEPATIC FUNCTION PANEL
ALT: 16 IU/L (ref 0–32)
AST: 18 IU/L (ref 0–40)
Albumin: 4.5 g/dL (ref 3.9–4.9)
Alkaline Phosphatase: 63 IU/L (ref 44–121)
Bilirubin Total: 0.4 mg/dL (ref 0.0–1.2)
Bilirubin, Direct: 0.17 mg/dL (ref 0.00–0.40)
Total Protein: 7.1 g/dL (ref 6.0–8.5)

## 2023-11-14 LAB — HEMOGLOBIN A1C
Est. average glucose Bld gHb Est-mCnc: 157 mg/dL
Hgb A1c MFr Bld: 7.1 % — ABNORMAL HIGH (ref 4.8–5.6)

## 2023-11-14 LAB — LIPID PANEL
Chol/HDL Ratio: 2.6 ratio (ref 0.0–4.4)
Cholesterol, Total: 154 mg/dL (ref 100–199)
HDL: 59 mg/dL (ref >39)
LDL Chol Calc (NIH): 65 mg/dL (ref 0–99)
Triglycerides: 179 mg/dL — ABNORMAL HIGH (ref 0–149)
VLDL Cholesterol Cal: 30 mg/dL (ref 5–40)

## 2023-11-14 LAB — BASIC METABOLIC PANEL WITH GFR
BUN/Creatinine Ratio: 21 (ref 12–28)
BUN: 18 mg/dL (ref 8–27)
CO2: 21 mmol/L (ref 20–29)
Calcium: 9.6 mg/dL (ref 8.7–10.3)
Chloride: 106 mmol/L (ref 96–106)
Creatinine, Ser: 0.85 mg/dL (ref 0.57–1.00)
Glucose: 136 mg/dL — ABNORMAL HIGH (ref 70–99)
Potassium: 4.3 mmol/L (ref 3.5–5.2)
Sodium: 144 mmol/L (ref 134–144)
eGFR: 74 mL/min/{1.73_m2} (ref >59)

## 2023-11-16 ENCOUNTER — Ambulatory Visit: Payer: PPO | Admitting: Family Medicine

## 2023-11-16 VITALS — BP 131/78 | HR 82 | Temp 98.4°F | Ht 66.0 in | Wt 229.4 lb

## 2023-11-16 DIAGNOSIS — E785 Hyperlipidemia, unspecified: Secondary | ICD-10-CM

## 2023-11-16 DIAGNOSIS — I10 Essential (primary) hypertension: Secondary | ICD-10-CM

## 2023-11-16 DIAGNOSIS — E1169 Type 2 diabetes mellitus with other specified complication: Secondary | ICD-10-CM

## 2023-11-16 DIAGNOSIS — Z1211 Encounter for screening for malignant neoplasm of colon: Secondary | ICD-10-CM

## 2023-11-16 DIAGNOSIS — E119 Type 2 diabetes mellitus without complications: Secondary | ICD-10-CM

## 2023-11-16 MED ORDER — ROSUVASTATIN CALCIUM 10 MG PO TABS: 10.0000 mg | ORAL_TABLET | Freq: Every day | ORAL | 1 refills | Status: DC

## 2023-11-16 MED ORDER — LOSARTAN POTASSIUM 50 MG PO TABS: 50.0000 mg | ORAL_TABLET | Freq: Every morning | ORAL | 1 refills | Status: DC

## 2023-11-16 MED ORDER — METFORMIN HCL 500 MG PO TABS: 250.0000 mg | ORAL_TABLET | Freq: Two times a day (BID) | ORAL | 1 refills | Status: DC

## 2023-11-16 NOTE — Progress Notes (Signed)
 Subjective:    Patient ID: Patricia Fuller, female    DOB: [birth year], 73 y.o.   MRN: [id number]  HPI Discussed the use of AI scribe software for clinical note transcription with the patient, who gave verbal consent to proceed.  History of Present Illness   Patricia Fuller is a 73 year old female with a history of diverticulitis and diabetes who presents for a follow-up visit.  She experienced abdominal discomfort and bowel issues, leading her to take a laxative, which resulted in difficulty urinating. This prompted a hospital visit where they were concerned about cancer. Further evaluation by a specialist confirmed she did not have cancer. She was previously diagnosed with diverticulitis, but recent follow-up indicated resolution of the condition. She is scheduled for another follow-up in a year.  She underwent a colonoscopy in 2022, which revealed non-cancerous tubular adenomas. She is due for a repeat colonoscopy this year and prefers a different preparation method due to previous discomfort.  Her diabetes management is described as fairly good, although her HbA1c has increased from 6.4 to 7.1, which she attributes to increased sugar intake from attending celebrations. She is consistent with her medications, which include diabetes, cholesterol, and blood pressure medications, all obtained from [facility] pharmacy.  No severe sweats, fevers, or significant changes in appetite. Bowel movements have improved with the use of Colace. She reports stiffness in one finger, attributed to osteoarthritis, a condition her father also had.  She is planning a trip to the beach to stay with her sons. She drives herself and reports no issues with driving. She enjoys setting up chairs and umbrellas on the beach to relax.        Review of Systems     Objective:   Physical Exam  General-in no acute distress Eyes-no discharge Lungs-respiratory rate normal, CTA CV-no murmurs,RRR Extremities skin warm dry no  edema Neuro grossly normal Behavior normal, alert  Results for orders placed or performed in visit on 11/13/23  Lipid panel   Collection Time: 11/13/23  9:43 AM  Result Value Ref Range   Cholesterol, Total 154 100 - 199 mg/dL   Triglycerides 829 (H) 0 - 149 mg/dL   HDL 59 >56 mg/dL   VLDL Cholesterol Cal 30 5 - 40 mg/dL   LDL Chol Calc (NIH) 65 0 - 99 mg/dL   Chol/HDL Ratio 2.6 0.0 - 4.4 ratio  Hepatic function panel   Collection Time: 11/13/23  9:43 AM  Result Value Ref Range   Total Protein 7.1 6.0 - 8.5 g/dL   Albumin  4.5 3.9 - 4.9 g/dL   Bilirubin Total 0.4 0.0 - 1.2 mg/dL   Bilirubin, Direct 2.13 0.00 - 0.40 mg/dL   Alkaline Phosphatase 63 44 - 121 IU/L   AST 18 0 - 40 IU/L   ALT 16 0 - 32 IU/L  Basic metabolic panel with GFR   Collection Time: 11/13/23  9:43 AM  Result Value Ref Range   Glucose 136 (H) 70 - 99 mg/dL   BUN 18 8 - 27 mg/dL   Creatinine, Ser 0.86 0.57 - 1.00 mg/dL   eGFR 74 >57 QI/ONG/2.95   BUN/Creatinine Ratio 21 12 - 28   Sodium 144 134 - 144 mmol/L   Potassium 4.3 3.5 - 5.2 mmol/L   Chloride 106 96 - 106 mmol/L   CO2 21 20 - 29 mmol/L   Calcium  9.6 8.7 - 10.3 mg/dL  Hemoglobin M8U   Collection Time: 11/13/23  9:43 AM  Result Value  Ref Range   Hgb A1c MFr Bld 7.1 (H) 4.8 - 5.6 %   Est. average glucose Bld gHb Est-mCnc 157 mg/dL  Microalbumin / creatinine urine ratio   Collection Time: 11/13/23  9:43 AM  Result Value Ref Range   Creatinine, Urine 249.3 Not Estab. mg/dL   Microalbumin, Urine 86.5 Not Estab. ug/mL   Microalb/Creat Ratio 11 0 - 29 mg/g creat        Assessment & Plan:  Assessment and Plan    Diabetes Mellitus Type 2 Glycemic control suboptimal with A1c increased to 7.1. Dietary indiscretions noted. - Encourage healthy diet and regular exercise. - Monitor blood glucose regularly. - Continue current diabetes medications.  Colonic Polyps Tubular adenoma found in 2022. Repeat colonoscopy needed. - Arrange repeat colonoscopy  with gastroenterology. - Discuss alternative bowel preparation methods.  Osteoarthritis Finger stiffness consistent with osteoarthritis. No severe pain or impairment. - Advise on pain management if symptoms worsen.  General Health Maintenance Declined pneumococcal vaccine and mammogram. Eye check-up due. Discussed pneumococcal vaccine benefits. - Encourage annual eye exam. - Discuss pneumococcal vaccine and mammogram benefits in future visits.  Follow-up Plans for vacation, returning in July. Next check-up in six months. - Schedule follow-up in December. - Order labs one week before December appointment.      1. Hyperlipidemia associated with type 2 diabetes mellitus (HCC) (Primary) Labs reviewed, at goal, continue meds, recheck 6 months  2. Essential hypertension, benign Blood pressure good continue current medication follow-up 6 months healthy diet regular physical activity  3. Diabetes mellitus without complication (HCC) A1c is gone up portion control physical activity healthy choices with food relook at labs in 6 months  4. Screening for colon cancer Referral for colonoscopy as planned - Ambulatory referral to Gastroenterology

## 2023-12-15 ENCOUNTER — Encounter (INDEPENDENT_AMBULATORY_CARE_PROVIDER_SITE_OTHER): Payer: Self-pay | Admitting: *Deleted

## 2024-03-04 ENCOUNTER — Telehealth: Payer: Self-pay | Admitting: Family Medicine

## 2024-03-04 ENCOUNTER — Other Ambulatory Visit: Payer: Self-pay | Admitting: Nurse Practitioner

## 2024-03-04 MED ORDER — ALENDRONATE SODIUM 70 MG PO TABS: 70.0000 mg | ORAL_TABLET | ORAL | 2 refills | Status: DC

## 2024-03-04 NOTE — Telephone Encounter (Signed)
 Refill on   alendronate  (FOSAMAX ) 70 MG tablet    [facility]

## 2024-05-16 ENCOUNTER — Other Ambulatory Visit: Payer: Self-pay | Admitting: Family Medicine

## 2024-05-17 ENCOUNTER — Ambulatory Visit: Admitting: Family Medicine

## 2024-05-25 ENCOUNTER — Other Ambulatory Visit: Payer: Self-pay | Admitting: Nurse Practitioner

## 2024-06-07 ENCOUNTER — Observation Stay (HOSPITAL_COMMUNITY)
Admission: EM | Admit: 2024-06-07 | Discharge: 2024-06-08 | Disposition: A | Discharge status: Home / Self Care | Attending: Internal Medicine | Admitting: Internal Medicine

## 2024-06-07 ENCOUNTER — Encounter (HOSPITAL_COMMUNITY): Payer: Self-pay | Admitting: Emergency Medicine

## 2024-06-07 ENCOUNTER — Emergency Department (HOSPITAL_COMMUNITY)

## 2024-06-07 ENCOUNTER — Other Ambulatory Visit: Payer: Self-pay

## 2024-06-07 DIAGNOSIS — Z6837 Body mass index (BMI) 37.0-37.9, adult: Secondary | ICD-10-CM | POA: Diagnosis not present

## 2024-06-07 DIAGNOSIS — E1169 Type 2 diabetes mellitus with other specified complication: Secondary | ICD-10-CM | POA: Diagnosis present

## 2024-06-07 DIAGNOSIS — E785 Hyperlipidemia, unspecified: Secondary | ICD-10-CM | POA: Diagnosis not present

## 2024-06-07 DIAGNOSIS — N201 Calculus of ureter: Principal | ICD-10-CM

## 2024-06-07 DIAGNOSIS — K8020 Calculus of gallbladder without cholecystitis without obstruction: Secondary | ICD-10-CM | POA: Diagnosis not present

## 2024-06-07 DIAGNOSIS — E66812 Obesity, class 2: Secondary | ICD-10-CM | POA: Insufficient documentation

## 2024-06-07 DIAGNOSIS — N132 Hydronephrosis with renal and ureteral calculous obstruction: Principal | ICD-10-CM | POA: Insufficient documentation

## 2024-06-07 DIAGNOSIS — R109 Unspecified abdominal pain: Secondary | ICD-10-CM | POA: Diagnosis present

## 2024-06-07 DIAGNOSIS — E119 Type 2 diabetes mellitus without complications: Secondary | ICD-10-CM | POA: Diagnosis not present

## 2024-06-07 DIAGNOSIS — R1032 Left lower quadrant pain: Secondary | ICD-10-CM | POA: Diagnosis not present

## 2024-06-07 DIAGNOSIS — N200 Calculus of kidney: Secondary | ICD-10-CM | POA: Diagnosis present

## 2024-06-07 DIAGNOSIS — I10 Essential (primary) hypertension: Secondary | ICD-10-CM | POA: Diagnosis not present

## 2024-06-07 DIAGNOSIS — Z7401 Bed confinement status: Secondary | ICD-10-CM | POA: Diagnosis not present

## 2024-06-07 LAB — CBC WITH DIFFERENTIAL/PLATELET
Abs Immature Granulocytes: 0.03 K/uL (ref 0.00–0.07)
Basophils Absolute: 0 K/uL (ref 0.0–0.1)
Basophils Relative: 0 %
Eosinophils Absolute: 0.2 K/uL (ref 0.0–0.5)
Eosinophils Relative: 2 %
HCT: 47.4 % — ABNORMAL HIGH (ref 36.0–46.0)
Hemoglobin: 15.2 g/dL — ABNORMAL HIGH (ref 12.0–15.0)
Immature Granulocytes: 0 %
Lymphocytes Relative: 22 %
Lymphs Abs: 1.8 K/uL (ref 0.7–4.0)
MCH: 29.5 pg (ref 26.0–34.0)
MCHC: 32.1 g/dL (ref 30.0–36.0)
MCV: 91.9 fL (ref 80.0–100.0)
Monocytes Absolute: 0.7 K/uL (ref 0.1–1.0)
Monocytes Relative: 9 %
Neutro Abs: 5.3 K/uL (ref 1.7–7.7)
Neutrophils Relative %: 67 %
Platelets: 190 K/uL (ref 150–400)
RBC: 5.16 MIL/uL — ABNORMAL HIGH (ref 3.87–5.11)
RDW: 13.2 % (ref 11.5–15.5)
WBC: 8.1 K/uL (ref 4.0–10.5)
nRBC: 0 % (ref 0.0–0.2)

## 2024-06-07 LAB — URINALYSIS, ROUTINE W REFLEX MICROSCOPIC
Bilirubin Urine: NEGATIVE
Glucose, UA: NEGATIVE mg/dL
Ketones, ur: NEGATIVE mg/dL
Nitrite: NEGATIVE
Protein, ur: 30 mg/dL — AB
RBC / HPF: >50 RBC/hpf (ref 0–5)
Specific Gravity, Urine: 1.024 (ref 1.005–1.030)
WBC, UA: >50 WBC/hpf (ref 0–5)
pH: 5 (ref 5.0–8.0)

## 2024-06-07 LAB — LIPASE, BLOOD: Lipase: 29 U/L (ref 11–51)

## 2024-06-07 LAB — COMPREHENSIVE METABOLIC PANEL WITH GFR
ALT: 18 U/L (ref 0–44)
AST: 23 U/L (ref 15–41)
Albumin: 4.5 g/dL (ref 3.5–5.0)
Alkaline Phosphatase: 61 U/L (ref 38–126)
Anion gap: 9 (ref 5–15)
BUN: 20 mg/dL (ref 8–23)
CO2: 29 mmol/L (ref 22–32)
Calcium: 10.1 mg/dL (ref 8.9–10.3)
Chloride: 102 mmol/L (ref 98–111)
Creatinine, Ser: 0.99 mg/dL (ref 0.44–1.00)
GFR, Estimated: >60 mL/min
Glucose, Bld: 159 mg/dL — ABNORMAL HIGH (ref 70–99)
Potassium: 4.3 mmol/L (ref 3.5–5.1)
Sodium: 140 mmol/L (ref 135–145)
Total Bilirubin: 0.6 mg/dL (ref 0.0–1.2)
Total Protein: 7.4 g/dL (ref 6.5–8.1)

## 2024-06-07 LAB — GLUCOSE, CAPILLARY: Glucose-Capillary: 155 mg/dL — ABNORMAL HIGH (ref 70–99)

## 2024-06-07 LAB — HEMOGLOBIN A1C
Hgb A1c MFr Bld: 7.1 % — ABNORMAL HIGH (ref 4.8–5.6)
Mean Plasma Glucose: 157.07 mg/dL

## 2024-06-07 LAB — CBG MONITORING, ED: Glucose-Capillary: 130 mg/dL — ABNORMAL HIGH (ref 70–99)

## 2024-06-07 MED ORDER — IOHEXOL 300 MG/ML  SOLN
100.0000 mL | Freq: Once | INTRAMUSCULAR | Status: AC | PRN
  Administered 2024-06-07: 100 mL via INTRAVENOUS

## 2024-06-07 MED ORDER — HYDRALAZINE HCL 20 MG/ML IJ SOLN: 10.0000 mg | INTRAMUSCULAR | Status: DC | PRN

## 2024-06-07 MED ORDER — MORPHINE SULFATE (PF) 2 MG/ML IV SOLN: 2.0000 mg | INTRAVENOUS | Status: DC | PRN

## 2024-06-07 MED ORDER — HEPARIN SODIUM (PORCINE) 5000 UNIT/ML IJ SOLN: 5000.0000 [IU] | Freq: Three times a day (TID) | INTRAMUSCULAR | Status: DC

## 2024-06-07 MED ORDER — KETOROLAC TROMETHAMINE 30 MG/ML IJ SOLN: 15.0000 mg | Freq: Once | INTRAMUSCULAR | Status: DC

## 2024-06-07 MED ORDER — LACTATED RINGERS IV SOLN: INTRAVENOUS | Status: DC

## 2024-06-07 MED ORDER — HYDROMORPHONE HCL 1 MG/ML IJ SOLN
1.0000 mg | Freq: Once | INTRAMUSCULAR | Status: AC
  Administered 2024-06-07: 1 mg via INTRAVENOUS
  Filled 2024-06-07: qty 1

## 2024-06-07 MED ORDER — ALBUTEROL SULFATE (2.5 MG/3ML) 0.083% IN NEBU: 2.5000 mg | INHALATION_SOLUTION | RESPIRATORY_TRACT | Status: DC | PRN

## 2024-06-07 MED ORDER — ONDANSETRON HCL 4 MG/2ML IJ SOLN
4.0000 mg | Freq: Once | INTRAMUSCULAR | Status: AC
  Administered 2024-06-07: 4 mg via INTRAVENOUS
  Filled 2024-06-07: qty 2

## 2024-06-07 MED ORDER — SODIUM CHLORIDE 0.9 % IV BOLUS
1000.0000 mL | Freq: Once | INTRAVENOUS | Status: AC
  Administered 2024-06-07: 1000 mL via INTRAVENOUS

## 2024-06-07 MED ORDER — KETOROLAC TROMETHAMINE 15 MG/ML IJ SOLN
15.0000 mg | Freq: Once | INTRAMUSCULAR | Status: AC
  Administered 2024-06-07: 15 mg via INTRAVENOUS
  Filled 2024-06-07: qty 1

## 2024-06-07 MED ORDER — ACETAMINOPHEN 650 MG RE SUPP: 650.0000 mg | Freq: Four times a day (QID) | RECTAL | Status: DC | PRN

## 2024-06-07 MED ORDER — ROSUVASTATIN CALCIUM 10 MG PO TABS
10.0000 mg | ORAL_TABLET | Freq: Every day | ORAL | Status: DC
  Filled 2024-06-07: qty 1

## 2024-06-07 MED ORDER — OXYCODONE HCL 5 MG PO TABS
5.0000 mg | ORAL_TABLET | ORAL | Status: DC | PRN
  Administered 2024-06-07: 5 mg via ORAL
  Filled 2024-06-07: qty 1

## 2024-06-07 MED ORDER — INSULIN ASPART 100 UNIT/ML IJ SOLN
0.0000 [IU] | Freq: Three times a day (TID) | INTRAMUSCULAR | Status: DC
  Administered 2024-06-08: 1 [IU] via SUBCUTANEOUS
  Filled 2024-06-07: qty 1

## 2024-06-07 MED ORDER — INSULIN ASPART 100 UNIT/ML IJ SOLN: 0.0000 [IU] | Freq: Every day | INTRAMUSCULAR | Status: DC

## 2024-06-07 MED ORDER — LOSARTAN POTASSIUM 25 MG PO TABS
50.0000 mg | ORAL_TABLET | Freq: Every morning | ORAL | Status: DC
  Administered 2024-06-08: 50 mg via ORAL
  Filled 2024-06-07: qty 2

## 2024-06-07 MED ORDER — ACETAMINOPHEN 325 MG PO TABS: 650.0000 mg | ORAL_TABLET | Freq: Four times a day (QID) | ORAL | Status: DC | PRN

## 2024-06-07 MED ORDER — SODIUM CHLORIDE 0.9 % IV SOLN
1.0000 g | INTRAVENOUS | Status: DC
  Administered 2024-06-07: 1 g via INTRAVENOUS
  Filled 2024-06-07: qty 10

## 2024-06-07 MED ORDER — HEPARIN SODIUM (PORCINE) 5000 UNIT/ML IJ SOLN
5000.0000 [IU] | Freq: Three times a day (TID) | INTRAMUSCULAR | Status: DC
  Filled 2024-06-07: qty 1

## 2024-06-07 NOTE — ED Triage Notes (Signed)
 Pt to the ED with complaints of lower left abdominal pain that radiates to her lower back with N/V since Tuesday.   Pt last vomited on Tuesday.  Pt states she has a history of Kidney stones.

## 2024-06-07 NOTE — ED Notes (Signed)
 Asked pt about giving a urine sample, pt is unable to at this time. Used prior to coming to the ER. Made pt aware a sample might be needed. RN notified.

## 2024-06-07 NOTE — H&P (Signed)
 "                                                                                                          [facility] H&P   Patient Demographics:    Patricia Fuller, is a 74 y.o. female  MRN: [id number]   DOB - Feb 08, 1951  Admit Date - 06/07/2024  Outpatient Primary MD for the patient is Alphonsa Glendia LABOR, MD    Chief Complaint  Patient presents with   Abdominal Pain      HPI:    Patricia Fuller  is a 74 y.o. female, with past medical history of hypertension, diabetes mellitus, hyperlipidemia, she presents to ED secondary to complaints of nausea, vomiting and abdominal pain, ongoing for last 3 days, with left-sided abdominal pain, radiating from back to the groin area, melena, bright red blood per rectum or hematochezia, she denies any fever or chills. -in ED patient was afebrile, labs with no evidence of leukocytosis, UA was significant for hematuria, CT abdomen pelvis was significant for obstructed left kidney stone 7 mm in the proximal left ureter associated with urothelial thickening,, as well with 2.8 cm gallstones within the gallbladder neck, with no evidence of acute cholecystitis, ED discussed with urology on-call who recommended admission to [facility], [facility]ist consulted to admit.    Review of systems:      A full 10 point Review of Systems was done, except as stated above, all other Review of Systems were negative.   With Past History of the following :    Past Medical History:  Diagnosis Date   Hyperlipidemia    Hypertension    Prediabetes    Renal calculi       Past Surgical History:  Procedure Laterality Date   BIOPSY  10/19/2020   Procedure: BIOPSY;  Surgeon: Cindie Carlin POUR, DO;  Location: [facility];  Service: Endoscopy;;  transverse colon polyp   COLONOSCOPY WITH PROPOFOL  N/A 10/19/2020   Procedure: COLONOSCOPY WITH PROPOFOL ;  Surgeon: Cindie Carlin POUR, DO;  Location: [facility];  Service: Endoscopy;  Laterality: N/A;  am appt   LITHOTRIPSY      POLYPECTOMY  10/19/2020   Procedure: POLYPECTOMY;  Surgeon: Cindie Carlin POUR, DO;  Location: [facility];  Service: Endoscopy;;  transverse colon polyp, descending colon polyp, sigmoid colon polyp    TUBAL LIGATION        Social History:     Social History   Tobacco Use   Smoking status: Never   Smokeless tobacco: Never  Substance Use Topics   Alcohol use: No       Family History :     Family History  Problem Relation Age of Onset   Hypertension Mother    Diabetes Mother    Heart disease Mother    Colon cancer Neg Hx      Home Medications:   Prior to Admission medications  Medication Sig Start Date End Date Taking? Authorizing Provider  alendronate  (FOSAMAX ) 70 MG tablet TAKE 1 TABLET BY MOUTH EVERY 7 DAYS. TAKE  WITH A FULL GLASS OF WATER ON AN EMPTY STOMACH 05/27/24   Mauro Elveria BROCKS, NP  cetirizine (ZYRTEC) 10 MG tablet Take 10 mg by mouth daily as needed for allergies.    [provider]  ipratropium (ATROVENT ) 0.06 % nasal spray Place 2 sprays into both nostrils 4 (four) times daily as needed for rhinitis. Patient not taking: Reported on 11/16/2023 06/22/23   Cook, Jayce G, DO  losartan  (COZAAR ) 50 MG tablet TAKE 1 TABLET BY MOUTH IN THE MORNING 05/16/24   Alphonsa Glendia LABOR, MD  metFORMIN  (GLUCOPHAGE ) 500 MG tablet TAKE 1/2 (ONE-HALF) TABLET BY MOUTH TWICE DAILY WITH A MEAL 05/16/24   Alphonsa Glendia LABOR, MD  rosuvastatin  (CRESTOR ) 10 MG tablet Take 1 tablet by mouth once daily 05/16/24   Alphonsa Glendia LABOR, MD     Allergies:    Allergies[1]   Physical Exam:   Vitals  Blood pressure (!) 140/79, pulse 89, temperature 97.7 F (36.5 C), temperature source Oral, resp. rate 18, height 5' 6 (1.676 m), weight 104.3 kg, SpO2 94%.   1. General Well-developed female, laying in bed, no apparent distress  2. Normal affect and insight, Not Suicidal or Homicidal, Awake Alert, Oriented X 3.  3. No F.N deficits, ALL C.Nerves Intact, Strength 5/5 all 4 extremities,  Sensation intact all 4 extremities, Plantars down going.  4. Ears and Eyes appear Normal, Conjunctivae clear, PERRLA. Moist Oral Mucosa.  5. Supple Neck, No JVD, No cervical lymphadenopathy appriciated, No Carotid Bruits.  6. Symmetrical Chest wall movement, Good air movement bilaterally, CTAB.  7. RRR, No Gallops, Rubs or Murmurs, No Parasternal Heave.  8. Positive Bowel Sounds, Abdomen Soft, left flank tenderness, No organomegaly appriciated,No rebound -guarding or rigidity.  9.  No Cyanosis, Normal Skin Turgor, No Skin Rash or Bruise.  10. Good muscle tone,  joints appear normal , no effusions, Normal ROM.     Data Review:    CBC Recent Labs  Lab 06/07/24 1024  WBC 8.1  HGB 15.2*  HCT 47.4*  PLT 190  MCV 91.9  MCH 29.5  MCHC 32.1  RDW 13.2  LYMPHSABS 1.8  MONOABS 0.7  EOSABS 0.2  BASOSABS 0.0   ------------------------------------------------------------------------------------------------------------------  Chemistries  Recent Labs  Lab 06/07/24 1024  NA 140  K 4.3  CL 102  CO2 29  GLUCOSE 159*  BUN 20  CREATININE 0.99  CALCIUM  10.1  AST 23  ALT 18  ALKPHOS 61  BILITOT 0.6   ------------------------------------------------------------------------------------------------------------------ estimated creatinine clearance is 63.6 mL/min (by C-G formula based on SCr of 0.99 mg/dL). ------------------------------------------------------------------------------------------------------------------ No results for input(s): TSH, T4TOTAL, T3FREE, THYROIDAB in the last 72 hours.  Invalid input(s): FREET3  Coagulation profile No results for input(s): INR, PROTIME in the last 168 hours. ------------------------------------------------------------------------------------------------------------------- No results for input(s): DDIMER in the last 72  hours. -------------------------------------------------------------------------------------------------------------------  Cardiac Enzymes No results for input(s): CKMB, TROPONINI, MYOGLOBIN in the last 168 hours.  Invalid input(s): CK ------------------------------------------------------------------------------------------------------------------ No results found for: BNP   ---------------------------------------------------------------------------------------------------------------  Urinalysis    Component Value Date/Time   COLORURINE YELLOW 06/07/2024 1025   APPEARANCEUR CLOUDY (A) 06/07/2024 1025   LABSPEC 1.024 06/07/2024 1025   PHURINE 5.0 06/07/2024 1025   GLUCOSEU NEGATIVE 06/07/2024 1025   HGBUR MODERATE (A) 06/07/2024 1025   BILIRUBINUR NEGATIVE 06/07/2024 1025   BILIRUBINUR negative 12/13/2020 1105   KETONESUR NEGATIVE 06/07/2024 1025   PROTEINUR 30 (A) 06/07/2024 1025   UROBILINOGEN 0.2 12/13/2020 1105   UROBILINOGEN 0.2 11/26/2013 1949   NITRITE  NEGATIVE 06/07/2024 1025   LEUKOCYTESUR LARGE (A) 06/07/2024 1025    ----------------------------------------------------------------------------------------------------------------   Imaging Results:    CT ABDOMEN PELVIS W CONTRAST Result Date: 06/07/2024 EXAM: CT ABDOMEN AND PELVIS WITH CONTRAST 06/07/2024 11:49:12 AM TECHNIQUE: CT of the abdomen and pelvis was performed with the administration of 100 mL of iohexol  (OMNIPAQUE ) 300 MG/ML solution. Multiplanar reformatted images are provided for review. Automated exposure control, iterative reconstruction, and/or weight-based adjustment of the mA/kV was utilized to reduce the radiation dose to as low as reasonably achievable. COMPARISON: None available. CLINICAL HISTORY: Abdominal pain, acute (Ped 0-17y) FINDINGS: LOWER CHEST: Bibasilar atelectasis. LIVER: The liver is unremarkable. GALLBLADDER AND BILE DUCTS: Calcified gallstone measuring up to 2.8 cm within the  neck of the gallbladder. No associated gallbladder wall thickening or pericholecystic fluid. No biliary ductal dilatation. SPLEEN: No acute abnormality. PANCREAS: No acute abnormality. ADRENAL GLANDS: No acute abnormality. KIDNEYS, URETERS AND BLADDER: 7 mm calcified stone within the proximal left ureter distal to the ureteropelvic junction with associated mild proximal hydroureter and at least mild proximal hydronephrosis. Associated urothelial thickening. No extravasation of intravenous contrast on delayed view from the left kidney. Delayed left nephrogram. No left nephrolithiasis. No right hydroureteronephrosis. No right nephroureterolithiasis. The right kidney enhances homogeneously. No filling defects of the partially visualized right collecting systems on delayed imaging. Bilateral simple parapelvic cysts. Simple renal cysts do not require additional follow-up unless clinically indicated due to signs/symptoms. No perinephric or periureteral stranding. Urinary bladder is unremarkable. GI AND BOWEL: Stomach demonstrates no acute abnormality. No small or large bowel thickening or dilatation. The appendix is unremarkable. Colonic diverticulosis. There is no bowel obstruction. PERITONEUM AND RETROPERITONEUM: No ascites. No free air. VASCULATURE: Atherosclerotic plaque. Aorta is normal in caliber. LYMPH NODES: No lymphadenopathy. REPRODUCTIVE ORGANS: The uterus is unremarkable. No adnexal mass. BONES AND SOFT TISSUES: Chronic T12 compression fracture. No focal soft tissue abnormality. IMPRESSION: 1. Obstructive 7 mm calcified stone within the proximal left ureter with associated urothelial thickening which may represent superimposed infection. Correlate with urinalysis. 2. Calcified 2.8 cm gallstone within the gallbladder neck, without CT evidence of acute cholecystitis. 3. Colonic diverticulosis with no acute diverticulitis. Electronically signed by: Morgane Naveau MD 06/07/2024 12:48 PM EST RP Workstation:  [id number]       Assessment & Plan:    Principal Problem:   Kidney stone Active Problems:   Essential hypertension, benign   Hyperlipidemia associated with type 2 diabetes mellitus (HCC)   Calculus of gallbladder without cholecystitis without obstruction   Diabetes mellitus without complication (HCC)    Left renal colic Infected kidney stone - Patient presents with left renal colic, 7 mm obstructed stone, with imaging concerning for infection, as well as her UA positive for white blood cells and leukocyte esterase. - Admit to [facility] per urology recommendation, will keep n.p.o. after midnight for intervention in a.m.SABRA - Started on IV Rocephin, follow urine cultures. - Continue with IV fluids. - Continue with as needed pain regimen  Hypertension - Continue with home medications, will add as needed hydralazine.  Diabetes mellitus - Hold metformin , check A1c, will start on insulin sliding scale  Hyperlipidemia  -Continue with statin  Cholelithiasis - Pain secondary to left renal colic, no epigastric or right upper quadrant pain, no evidence of acute cholecystitis on imaging, exam or labs, lipase within normal limit. - I have discussed this finding with the patient, can be followed as an outpatient with general surgery  Obesity class II - Body mass index is 37.12  kg/m.   DVT Prophylaxis Heparin  AM Labs Ordered, also please review Full Orders  Family Communication: Admission, patients condition and plan of care including tests being ordered have been discussed with the patient and son at bedside who indicate understanding and agree with the plan and Code Status.  Code Status full code  Likely DC to home  Consults called: Discussed with urology Dr. Sherrilee, he will inform urology at [facility] about the patient  Admission status: Inpatient  Time spent in minutes : 70 minutes   Brayton Lye M.D on 06/07/2024 at 4:30 PM   [facility] - Office   [phone]        [1] No Known Allergies  "

## 2024-06-07 NOTE — Consult Note (Signed)
 Reason for Consult:LEFT Ureteral Stone  Referring Physician: Brayton Lye MD  Patricia Fuller is an 74 y.o. female.   HPI:   1 - LEFT Ureteral Stone - 7mm left proximal sotne on ER CT 06/07/24. Stone is solitary with moderate hydro. Cr 0.99, UA without infections parameters. No fevers /. Leukocytosis.  PMH sig for obesity. Denies CV disease / blood thinners. Her PCP is Dr. Alphonsa in [place].   Today Tulip is seen in consultation for LEFT ureteral stone. She was admitted through ER last night to hospitalist service without us  even being contacted prior. Received rocephin  yesterday.   Past Medical History:  Diagnosis Date   Hyperlipidemia    Hypertension    Prediabetes    Renal calculi     Past Surgical History:  Procedure Laterality Date   BIOPSY  10/19/2020   Procedure: BIOPSY;  Surgeon: Cindie Carlin POUR, DO;  Location: [facility];  Service: Endoscopy;;  transverse colon polyp   COLONOSCOPY WITH PROPOFOL  N/A 10/19/2020   Procedure: COLONOSCOPY WITH PROPOFOL ;  Surgeon: Cindie Carlin POUR, DO;  Location: [facility];  Service: Endoscopy;  Laterality: N/A;  am appt   LITHOTRIPSY     POLYPECTOMY  10/19/2020   Procedure: POLYPECTOMY;  Surgeon: Cindie Carlin POUR, DO;  Location: [facility];  Service: Endoscopy;;  transverse colon polyp, descending colon polyp, sigmoid colon polyp    TUBAL LIGATION      Family History  Problem Relation Age of Onset   Hypertension Mother    Diabetes Mother    Heart disease Mother    Colon cancer Neg Hx     Social History:  reports that she has never smoked. She has never used smokeless tobacco. She reports that she does not drink alcohol and does not use drugs.  Allergies: Allergies[1]  Medications: I have reviewed the patient's current medications.  Results for orders placed or performed during the hospital encounter of 06/07/24 (from the past 48 hours)  CBC with Differential     Status: Abnormal   Collection Time: 06/07/24 10:24 AM   Result Value Ref Range   WBC 8.1 4.0 - 10.5 K/uL   RBC 5.16 (H) 3.87 - 5.11 MIL/uL   Hemoglobin 15.2 (H) 12.0 - 15.0 g/dL   HCT 52.5 (H) 63.9 - 53.9 %   MCV 91.9 80.0 - 100.0 fL   MCH 29.5 26.0 - 34.0 pg   MCHC 32.1 30.0 - 36.0 g/dL   RDW 86.7 88.4 - 84.4 %   Platelets 190 150 - 400 K/uL   nRBC 0.0 0.0 - 0.2 %   Neutrophils Relative % 67 %   Neutro Abs 5.3 1.7 - 7.7 K/uL   Lymphocytes Relative 22 %   Lymphs Abs 1.8 0.7 - 4.0 K/uL   Monocytes Relative 9 %   Monocytes Absolute 0.7 0.1 - 1.0 K/uL   Eosinophils Relative 2 %   Eosinophils Absolute 0.2 0.0 - 0.5 K/uL   Basophils Relative 0 %   Basophils Absolute 0.0 0.0 - 0.1 K/uL   Immature Granulocytes 0 %   Abs Immature Granulocytes 0.03 0.00 - 0.07 K/uL    Comment: Performed at [facility], [address], [place], [place] [address]  Comprehensive metabolic panel     Status: Abnormal   Collection Time: 06/07/24 10:24 AM  Result Value Ref Range   Sodium 140 135 - 145 mmol/L    Comment: Electrolytes repeated to verify    Potassium 4.3 3.5 - 5.1 mmol/L  Chloride 102 98 - 111 mmol/L   CO2 29 22 - 32 mmol/L   Glucose, Bld 159 (H) 70 - 99 mg/dL    Comment: Glucose reference range applies only to samples taken after fasting for at least 8 hours.   BUN 20 8 - 23 mg/dL   Creatinine, Ser 9.00 0.44 - 1.00 mg/dL   Calcium  10.1 8.9 - 10.3 mg/dL   Total Protein 7.4 6.5 - 8.1 g/dL   Albumin  4.5 3.5 - 5.0 g/dL   AST 23 15 - 41 U/L   ALT 18 0 - 44 U/L   Alkaline Phosphatase 61 38 - 126 U/L   Total Bilirubin 0.6 0.0 - 1.2 mg/dL   GFR, Estimated >39 >39 mL/min    Comment: (NOTE) Calculated using the CKD-EPI Creatinine Equation (2021)    Anion gap 9 5 - 15    Comment: Performed at [facility], [address] [place], [place] [address]  Lipase, blood     Status: None   Collection Time: 06/07/24 10:24 AM  Result Value Ref Range   Lipase 29 11 - 51 U/L    Comment: Performed at [facility], [address] [place], [place] [address]   Urinalysis, Routine w reflex microscopic -Urine, Clean Catch     Status: Abnormal   Collection Time: 06/07/24 10:25 AM  Result Value Ref Range   Color, Urine YELLOW YELLOW   APPearance CLOUDY (A) CLEAR   Specific Gravity, Urine 1.024 1.005 - 1.030   pH 5.0 5.0 - 8.0   Glucose, UA NEGATIVE NEGATIVE mg/dL   Hgb urine dipstick MODERATE (A) NEGATIVE   Bilirubin Urine NEGATIVE NEGATIVE   Ketones, ur NEGATIVE NEGATIVE mg/dL   Protein, ur 30 (A) NEGATIVE mg/dL   Nitrite NEGATIVE NEGATIVE   Leukocytes,Ua LARGE (A) NEGATIVE   RBC / HPF >50 0 - 5 RBC/hpf   WBC, UA >50 0 - 5 WBC/hpf   Bacteria, UA RARE (A) NONE SEEN   Squamous Epithelial / HPF 11-20 0 - 5 /HPF   Mucus PRESENT    Budding Yeast PRESENT     Comment: Performed at [facility], [address] [place], [place] [address]  CBG monitoring, ED     Status: Abnormal   Collection Time: 06/07/24  5:10 PM  Result Value Ref Range   Glucose-Capillary 130 (H) 70 - 99 mg/dL    Comment: Glucose reference range applies only to samples taken after fasting for at least 8 hours.    CT ABDOMEN PELVIS W CONTRAST Result Date: 06/07/2024 EXAM: CT ABDOMEN AND PELVIS WITH CONTRAST 06/07/2024 11:49:12 AM TECHNIQUE: CT of the abdomen and pelvis was performed with the administration of 100 mL of iohexol  (OMNIPAQUE ) 300 MG/ML solution. Multiplanar reformatted images are provided for review. Automated exposure control, iterative reconstruction, and/or weight-based adjustment of the mA/kV was utilized to reduce the radiation dose to as low as reasonably achievable. COMPARISON: None available. CLINICAL HISTORY: Abdominal pain, acute (Ped 0-17y) FINDINGS: LOWER CHEST: Bibasilar atelectasis. LIVER: The liver is unremarkable. GALLBLADDER AND BILE DUCTS: Calcified gallstone measuring up to 2.8 cm within the neck of the gallbladder. No associated gallbladder wall thickening or pericholecystic fluid. No biliary ductal dilatation. SPLEEN: No acute abnormality. PANCREAS: No  acute abnormality. ADRENAL GLANDS: No acute abnormality. KIDNEYS, URETERS AND BLADDER: 7 mm calcified stone within the proximal left ureter distal to the ureteropelvic junction with associated mild proximal hydroureter and at least mild proximal hydronephrosis. Associated urothelial thickening. No extravasation of intravenous contrast on delayed view  from the left kidney. Delayed left nephrogram. No left nephrolithiasis. No right hydroureteronephrosis. No right nephroureterolithiasis. The right kidney enhances homogeneously. No filling defects of the partially visualized right collecting systems on delayed imaging. Bilateral simple parapelvic cysts. Simple renal cysts do not require additional follow-up unless clinically indicated due to signs/symptoms. No perinephric or periureteral stranding. Urinary bladder is unremarkable. GI AND BOWEL: Stomach demonstrates no acute abnormality. No small or large bowel thickening or dilatation. The appendix is unremarkable. Colonic diverticulosis. There is no bowel obstruction. PERITONEUM AND RETROPERITONEUM: No ascites. No free air. VASCULATURE: Atherosclerotic plaque. Aorta is normal in caliber. LYMPH NODES: No lymphadenopathy. REPRODUCTIVE ORGANS: The uterus is unremarkable. No adnexal mass. BONES AND SOFT TISSUES: Chronic T12 compression fracture. No focal soft tissue abnormality. IMPRESSION: 1. Obstructive 7 mm calcified stone within the proximal left ureter with associated urothelial thickening which may represent superimposed infection. Correlate with urinalysis. 2. Calcified 2.8 cm gallstone within the gallbladder neck, without CT evidence of acute cholecystitis. 3. Colonic diverticulosis with no acute diverticulitis. Electronically signed by: Morgane Naveau MD 06/07/2024 12:48 PM EST RP Workstation: [id number]    Review of Systems  Constitutional:  Negative for chills and fever.  Genitourinary:  Positive for flank pain.  All other systems reviewed and are  negative.  Blood pressure (!) 147/76, pulse 98, temperature 98.3 F (36.8 C), temperature source Oral, resp. rate 16, height 5' 6 (1.676 m), weight 104.3 kg, SpO2 97%. Physical Exam Vitals reviewed.  HENT:     Head: Normocephalic.  Cardiovascular:     Rate and Rhythm: Normal rate.  Abdominal:     Comments: Moderate truncal obesity  Genitourinary:    Comments: Minimal left CVAT at this moment Skin:    General: Skin is warm.  Neurological:     General: No focal deficit present.     Mental Status: She is alert.     Assessment/Plan:  As patient's colic difficult to control, I offered left ureteroscopy with goal of stone free, likely DC post-op. SWL also reasoable but not available in acute setting. Risks, benefits, peri-op course discussed.     [name]. 06/07/2024, 9:07 PM         [1] No Known Allergies

## 2024-06-07 NOTE — ED Provider Notes (Signed)
 " [place] [facility] AT [facility] Provider Note   CSN: [id number] Arrival date & time: 06/07/24  9040     Patient presents with: Abdominal Pain   Patricia Fuller is a 74 y.o. female.  presents to the ED with nausea, vomiting, abdominal pain, diarrhea started 3 days ago.  Patient reports left-sided abdominal pain that does radiate to the back in the pelvic area.  Denies blood in stool or vomit. She denies taking any medication. She denies recent travel.  She denies any alcohol or drug use.  Patient notes a history of diverticulitis and kidney stones.  Denies abdominal surgery.  Patient denies fevers, cough, chills, shortness of breath, chest pain, hematuria, black stools, or any other symptoms at this time.     Abdominal Pain Associated symptoms: diarrhea, nausea and vomiting        Prior to Admission medications  Medication Sig Start Date End Date Taking? Authorizing Provider  alendronate  (FOSAMAX ) 70 MG tablet TAKE 1 TABLET BY MOUTH EVERY 7 DAYS. TAKE WITH A FULL GLASS OF WATER ON AN EMPTY STOMACH 05/27/24   Mauro Elveria BROCKS, NP  cetirizine (ZYRTEC) 10 MG tablet Take 10 mg by mouth daily as needed for allergies.    [provider]  ipratropium (ATROVENT ) 0.06 % nasal spray Place 2 sprays into both nostrils 4 (four) times daily as needed for rhinitis. Patient not taking: Reported on 11/16/2023 06/22/23   Cook, Jayce G, DO  losartan  (COZAAR ) 50 MG tablet TAKE 1 TABLET BY MOUTH IN THE MORNING 05/16/24   Alphonsa Glendia LABOR, MD  metFORMIN  (GLUCOPHAGE ) 500 MG tablet TAKE 1/2 (ONE-HALF) TABLET BY MOUTH TWICE DAILY WITH A MEAL 05/16/24   Alphonsa Glendia LABOR, MD  rosuvastatin  (CRESTOR ) 10 MG tablet Take 1 tablet by mouth once daily 05/16/24   Alphonsa Glendia LABOR, MD    Allergies: Patient has no known allergies.    Review of Systems  Gastrointestinal:  Positive for abdominal pain, diarrhea, nausea and vomiting.    Updated Vital Signs BP (!) 140/79 (BP Location: Left  Arm)   Pulse 89   Temp 97.7 F (36.5 C) (Oral)   Resp 18   Ht 5' 6 (1.676 m)   Wt 104.3 kg   SpO2 94%   BMI 37.12 kg/m   Physical Exam Vitals and nursing note reviewed.  Constitutional:      General: She is in acute distress.     Appearance: She is well-developed.     Comments: Patient was actively vomiting and appears in pain upon initial evaluation  HENT:     Head: Normocephalic and atraumatic.  Eyes:     Conjunctiva/sclera: Conjunctivae normal.  Cardiovascular:     Rate and Rhythm: Normal rate and regular rhythm.     Heart sounds: Normal heart sounds. No murmur heard. Pulmonary:     Effort: Pulmonary effort is normal. No respiratory distress.     Breath sounds: Normal breath sounds.  Abdominal:     Palpations: Abdomen is soft.     Tenderness: There is abdominal tenderness in the left lower quadrant. There is no right CVA tenderness, left CVA tenderness, guarding or rebound. Negative signs include Murphy's sign, Rovsing's sign, McBurney's sign, psoas sign and obturator sign.  Musculoskeletal:        General: No swelling.     Cervical back: Neck supple.  Skin:    General: Skin is warm and dry.     Capillary Refill: Capillary refill takes less than 2  seconds.  Neurological:     Mental Status: She is alert.  Psychiatric:        Mood and Affect: Mood normal.     (all labs ordered are listed, but only abnormal results are displayed) Labs Reviewed  CBC WITH DIFFERENTIAL/PLATELET - Abnormal; Notable for the following components:      Result Value   RBC 5.16 (*)    Hemoglobin 15.2 (*)    HCT 47.4 (*)    All other components within normal limits  COMPREHENSIVE METABOLIC PANEL WITH GFR - Abnormal; Notable for the following components:   Glucose, Bld 159 (*)    All other components within normal limits  URINALYSIS, ROUTINE W REFLEX MICROSCOPIC - Abnormal; Notable for the following components:   APPearance CLOUDY (*)    Hgb urine dipstick MODERATE (*)    Protein, ur 30  (*)    Leukocytes,Ua LARGE (*)    Bacteria, UA RARE (*)    All other components within normal limits  LIPASE, BLOOD    EKG: None  Radiology: CT ABDOMEN PELVIS W CONTRAST Result Date: 06/07/2024 EXAM: CT ABDOMEN AND PELVIS WITH CONTRAST 06/07/2024 11:49:12 AM TECHNIQUE: CT of the abdomen and pelvis was performed with the administration of 100 mL of iohexol  (OMNIPAQUE ) 300 MG/ML solution. Multiplanar reformatted images are provided for review. Automated exposure control, iterative reconstruction, and/or weight-based adjustment of the mA/kV was utilized to reduce the radiation dose to as low as reasonably achievable. COMPARISON: None available. CLINICAL HISTORY: Abdominal pain, acute (Ped 0-17y) FINDINGS: LOWER CHEST: Bibasilar atelectasis. LIVER: The liver is unremarkable. GALLBLADDER AND BILE DUCTS: Calcified gallstone measuring up to 2.8 cm within the neck of the gallbladder. No associated gallbladder wall thickening or pericholecystic fluid. No biliary ductal dilatation. SPLEEN: No acute abnormality. PANCREAS: No acute abnormality. ADRENAL GLANDS: No acute abnormality. KIDNEYS, URETERS AND BLADDER: 7 mm calcified stone within the proximal left ureter distal to the ureteropelvic junction with associated mild proximal hydroureter and at least mild proximal hydronephrosis. Associated urothelial thickening. No extravasation of intravenous contrast on delayed view from the left kidney. Delayed left nephrogram. No left nephrolithiasis. No right hydroureteronephrosis. No right nephroureterolithiasis. The right kidney enhances homogeneously. No filling defects of the partially visualized right collecting systems on delayed imaging. Bilateral simple parapelvic cysts. Simple renal cysts do not require additional follow-up unless clinically indicated due to signs/symptoms. No perinephric or periureteral stranding. Urinary bladder is unremarkable. GI AND BOWEL: Stomach demonstrates no acute abnormality. No small or  large bowel thickening or dilatation. The appendix is unremarkable. Colonic diverticulosis. There is no bowel obstruction. PERITONEUM AND RETROPERITONEUM: No ascites. No free air. VASCULATURE: Atherosclerotic plaque. Aorta is normal in caliber. LYMPH NODES: No lymphadenopathy. REPRODUCTIVE ORGANS: The uterus is unremarkable. No adnexal mass. BONES AND SOFT TISSUES: Chronic T12 compression fracture. No focal soft tissue abnormality. IMPRESSION: 1. Obstructive 7 mm calcified stone within the proximal left ureter with associated urothelial thickening which may represent superimposed infection. Correlate with urinalysis. 2. Calcified 2.8 cm gallstone within the gallbladder neck, without CT evidence of acute cholecystitis. 3. Colonic diverticulosis with no acute diverticulitis. Electronically signed by: Morgane Naveau MD 06/07/2024 12:48 PM EST RP Workstation: [id number]     Procedures   Medications Ordered in the ED  sodium chloride  0.9 % bolus 1,000 mL (0 mLs Intravenous Stopped 06/07/24 1406)  ondansetron  (ZOFRAN ) injection 4 mg (4 mg Intravenous Given 06/07/24 1035)  HYDROmorphone  (DILAUDID ) injection 1 mg (1 mg Intravenous Given 06/07/24 1123)  iohexol  (OMNIPAQUE ) 300 MG/ML solution 100  mL (100 mLs Intravenous Contrast Given 06/07/24 1139)  HYDROmorphone  (DILAUDID ) injection 1 mg (1 mg Intravenous Given 06/07/24 1406)  ketorolac  (TORADOL ) 15 MG/ML injection 15 mg (15 mg Intravenous Given 06/07/24 1406)                                    Medical Decision Making Amount and/or Complexity of Data Reviewed Labs: ordered. Radiology: ordered.  Risk Prescription drug management.     This patient presents to the ED for concern of abdominal pain, nausea, vomiting, this involves an extensive number of treatment options, and is a complaint that carries with it a high risk of complications and morbidity.  The differential diagnosis includes AAA, mesenteric ischemia, appendicitis, diverticulitis, DKA,  gastroenteritis, nephrolithiasis, pancreatitis, constipation, UTI, bowel obstruction, biliary disease, IBD, PUD, GERD.   Co morbidities / Chronic conditions that complicate the patient evaluation  Hypertension, cholelithiasis, kidney stones, diverticulitis, diabetes   Additional history obtained:  Additional history obtained from EMR External records from outside source obtained and reviewed including CT abdomen pelvis from 10/20/2023 which revealed diverticulitis.    Lab Tests:  I Ordered, and personally interpreted labs.  The pertinent results include: No leukocytosis, normal lipase, otherwise unremarkable.   Imaging Studies ordered:  I ordered imaging studies including CT abdomen pelvis with contrast I independently visualized and interpreted imaging which showed obstructing 7 mm calcified stone in the proximal left ureter with associated urothelial thickening and diverticulosis. Calcified 2.8 cm gallstone within the gallbladder neck  on CT.  I agree with the radiologist interpretation   Cardiac Monitoring: / EKG:  The patient was maintained on a cardiac monitor.  I personally viewed and interpreted the cardiac monitored which showed an underlying rhythm of: Normal sinus    Dispostion: Admit   Patient presents with left lower quadrant abdominal pain, nausea, vomiting, diarrhea.  On exam patient was actively vomiting and appeared in distress due to her pain.  She is hypertensive but other vital signs stable.  Provided with Zofran , fluids, Dilaudid  for her symptoms.  There was tenderness to left lower quadrant no guarding, rigidity, rebound tenderness on exam today.  No CVA tenderness appreciated.  She is afebrile. Lab work was benign here today.  CT abdomen pelvis revealed obstructing 7 mm calcified stone in the proximal left ureter with associated urothelial thickening and diverticulosis.  Patient was given additional Dilaudid  and Toradol  for pain.  Upon reevaluation, patient did  report improvement in her symptoms.  No signs of sepsis.  Urology was consulted who recommended hospitalist admission for pain control to [facility] for further management and they will evaluate her there once admitted.  This was discussed with the patient and she is in agreement with plan.   Discussed case with hospitalist who will admit the patient for pain control.       Final diagnoses:  Left ureteral stone    ED Discharge Orders     None          Braxton Dubois, PA-C 06/07/24 1551    Cleotilde Rogue, MD 06/08/24 [phone]  "

## 2024-06-08 ENCOUNTER — Inpatient Hospital Stay (HOSPITAL_COMMUNITY)

## 2024-06-08 ENCOUNTER — Encounter (HOSPITAL_COMMUNITY)
Admission: EM | Disposition: A | Discharge status: Home / Self Care | Payer: Self-pay | Source: Home / Self Care | Attending: Internal Medicine

## 2024-06-08 ENCOUNTER — Inpatient Hospital Stay (HOSPITAL_COMMUNITY): Admitting: Certified Registered"

## 2024-06-08 DIAGNOSIS — E669 Obesity, unspecified: Secondary | ICD-10-CM

## 2024-06-08 DIAGNOSIS — E119 Type 2 diabetes mellitus without complications: Secondary | ICD-10-CM

## 2024-06-08 DIAGNOSIS — E1169 Type 2 diabetes mellitus with other specified complication: Secondary | ICD-10-CM

## 2024-06-08 DIAGNOSIS — E785 Hyperlipidemia, unspecified: Secondary | ICD-10-CM | POA: Diagnosis not present

## 2024-06-08 DIAGNOSIS — N201 Calculus of ureter: Secondary | ICD-10-CM

## 2024-06-08 DIAGNOSIS — K8020 Calculus of gallbladder without cholecystitis without obstruction: Secondary | ICD-10-CM

## 2024-06-08 DIAGNOSIS — I10 Essential (primary) hypertension: Secondary | ICD-10-CM

## 2024-06-08 DIAGNOSIS — N200 Calculus of kidney: Secondary | ICD-10-CM | POA: Diagnosis not present

## 2024-06-08 HISTORY — PX: CYSTOSCOPY/URETEROSCOPY/HOLMIUM LASER/STENT PLACEMENT: SHX6546

## 2024-06-08 LAB — BASIC METABOLIC PANEL WITH GFR
Anion gap: 9 (ref 5–15)
BUN: 23 mg/dL (ref 8–23)
CO2: 25 mmol/L (ref 22–32)
Calcium: 9.2 mg/dL (ref 8.9–10.3)
Chloride: 108 mmol/L (ref 98–111)
Creatinine, Ser: 1.1 mg/dL — ABNORMAL HIGH (ref 0.44–1.00)
GFR, Estimated: 53 mL/min — ABNORMAL LOW
Glucose, Bld: 132 mg/dL — ABNORMAL HIGH (ref 70–99)
Potassium: 4.5 mmol/L (ref 3.5–5.1)
Sodium: 142 mmol/L (ref 135–145)

## 2024-06-08 LAB — CBC
HCT: 41 % (ref 36.0–46.0)
Hemoglobin: 13.2 g/dL (ref 12.0–15.0)
MCH: 29.4 pg (ref 26.0–34.0)
MCHC: 32.2 g/dL (ref 30.0–36.0)
MCV: 91.3 fL (ref 80.0–100.0)
Platelets: 152 K/uL (ref 150–400)
RBC: 4.49 MIL/uL (ref 3.87–5.11)
RDW: 13.2 % (ref 11.5–15.5)
WBC: 6.9 K/uL (ref 4.0–10.5)
nRBC: 0 % (ref 0.0–0.2)

## 2024-06-08 LAB — GLUCOSE, CAPILLARY: Glucose-Capillary: 127 mg/dL — ABNORMAL HIGH (ref 70–99)

## 2024-06-08 MED ORDER — LIDOCAINE HCL (PF) 2 % IJ SOLN
INTRAMUSCULAR | Status: DC | PRN
  Administered 2024-06-08: 60 mg via INTRADERMAL

## 2024-06-08 MED ORDER — MIDAZOLAM HCL (PF) 2 MG/2ML IJ SOLN
INTRAMUSCULAR | Status: DC | PRN
  Administered 2024-06-08: 2 mg via INTRAVENOUS

## 2024-06-08 MED ORDER — ACETAMINOPHEN 10 MG/ML IV SOLN
INTRAVENOUS | Status: AC
  Filled 2024-06-08: qty 100

## 2024-06-08 MED ORDER — LIDOCAINE HCL (PF) 2 % IJ SOLN
INTRAMUSCULAR | Status: AC
  Filled 2024-06-08: qty 5

## 2024-06-08 MED ORDER — ONDANSETRON HCL 4 MG/2ML IJ SOLN
INTRAMUSCULAR | Status: AC
  Filled 2024-06-08: qty 2

## 2024-06-08 MED ORDER — ACETAMINOPHEN 500 MG PO TABS
1000.0000 mg | ORAL_TABLET | Freq: Once | ORAL | Status: DC
  Filled 2024-06-08: qty 2

## 2024-06-08 MED ORDER — FENTANYL CITRATE (PF) 100 MCG/2ML IJ SOLN
INTRAMUSCULAR | Status: DC | PRN
  Administered 2024-06-08: 50 ug via INTRAVENOUS

## 2024-06-08 MED ORDER — OXYCODONE-ACETAMINOPHEN 5-325 MG PO TABS: 1.0000 | ORAL_TABLET | Freq: Four times a day (QID) | ORAL | 0 refills | Status: DC | PRN

## 2024-06-08 MED ORDER — MIDAZOLAM HCL 2 MG/2ML IJ SOLN
INTRAMUSCULAR | Status: AC
  Filled 2024-06-08: qty 2

## 2024-06-08 MED ORDER — ACETAMINOPHEN 10 MG/ML IV SOLN
INTRAVENOUS | Status: DC | PRN
  Administered 2024-06-08: 1000 mg via INTRAVENOUS

## 2024-06-08 MED ORDER — OXYCODONE HCL 5 MG/5ML PO SOLN: 5.0000 mg | Freq: Once | ORAL | Status: DC | PRN

## 2024-06-08 MED ORDER — CEPHALEXIN 500 MG PO CAPS: 500.0000 mg | ORAL_CAPSULE | Freq: Two times a day (BID) | ORAL | 0 refills | Status: DC

## 2024-06-08 MED ORDER — SODIUM CHLORIDE 0.9 % IR SOLN
Status: DC | PRN
  Administered 2024-06-08: 3000 mL via INTRAVESICAL

## 2024-06-08 MED ORDER — IOHEXOL 300 MG/ML  SOLN
INTRAMUSCULAR | Status: DC | PRN
  Administered 2024-06-08: 15 mL

## 2024-06-08 MED ORDER — SENNOSIDES-DOCUSATE SODIUM 8.6-50 MG PO TABS: 1.0000 | ORAL_TABLET | Freq: Two times a day (BID) | ORAL | 0 refills | Status: AC

## 2024-06-08 MED ORDER — PROPOFOL 10 MG/ML IV BOLUS
INTRAVENOUS | Status: AC
  Filled 2024-06-08: qty 20

## 2024-06-08 MED ORDER — DEXAMETHASONE SOD PHOSPHATE PF 10 MG/ML IJ SOLN
INTRAMUSCULAR | Status: DC | PRN
  Administered 2024-06-08: 4 mg via INTRAVENOUS

## 2024-06-08 MED ORDER — ONDANSETRON HCL 4 MG/2ML IJ SOLN: 4.0000 mg | Freq: Once | INTRAMUSCULAR | Status: DC | PRN

## 2024-06-08 MED ORDER — PROPOFOL 10 MG/ML IV BOLUS
INTRAVENOUS | Status: DC | PRN
  Administered 2024-06-08: 140 mg via INTRAVENOUS

## 2024-06-08 MED ORDER — KETOROLAC TROMETHAMINE 10 MG PO TABS: 10.0000 mg | ORAL_TABLET | Freq: Three times a day (TID) | ORAL | 0 refills | Status: AC | PRN

## 2024-06-08 MED ORDER — OXYCODONE HCL 5 MG PO TABS: 5.0000 mg | ORAL_TABLET | Freq: Once | ORAL | Status: DC | PRN

## 2024-06-08 MED ORDER — PHENYLEPHRINE 80 MCG/ML (10ML) SYRINGE FOR IV PUSH (FOR BLOOD PRESSURE SUPPORT)
PREFILLED_SYRINGE | INTRAVENOUS | Status: DC | PRN
  Administered 2024-06-08: 80 ug via INTRAVENOUS
  Administered 2024-06-08: 160 ug via INTRAVENOUS
  Administered 2024-06-08: 80 ug via INTRAVENOUS

## 2024-06-08 MED ORDER — FENTANYL CITRATE (PF) 100 MCG/2ML IJ SOLN
INTRAMUSCULAR | Status: AC
  Filled 2024-06-08: qty 2

## 2024-06-08 MED ORDER — FENTANYL CITRATE (PF) 50 MCG/ML IJ SOSY: 25.0000 ug | PREFILLED_SYRINGE | INTRAMUSCULAR | Status: DC | PRN

## 2024-06-08 MED ORDER — ONDANSETRON HCL 4 MG/2ML IJ SOLN
INTRAMUSCULAR | Status: DC | PRN
  Administered 2024-06-08: 4 mg via INTRAVENOUS

## 2024-06-08 NOTE — Plan of Care (Signed)
 " Problem: Education: Goal: Knowledge of General Education information will improve Description: Including pain rating scale, medication(s)/side effects and non-pharmacologic comfort measures 06/08/2024 1212 by Reesa Burnard HERO, RN Outcome: Adequate for Discharge 06/08/2024 1212 by Reesa Burnard HERO, RN Outcome: Progressing   Problem: Health Behavior/Discharge Planning: Goal: Ability to manage health-related needs will improve 06/08/2024 1212 by Reesa Burnard HERO, RN Outcome: Adequate for Discharge 06/08/2024 1212 by Reesa Burnard HERO, RN Outcome: Progressing   Problem: Clinical Measurements: Goal: Ability to maintain clinical measurements within normal limits will improve 06/08/2024 1212 by Reesa Burnard HERO, RN Outcome: Adequate for Discharge 06/08/2024 1212 by Reesa Burnard HERO, RN Outcome: Progressing Goal: Will remain free from infection 06/08/2024 1212 by Reesa Burnard HERO, RN Outcome: Adequate for Discharge 06/08/2024 1212 by Reesa Burnard HERO, RN Outcome: Progressing Goal: Diagnostic test results will improve 06/08/2024 1212 by Reesa Burnard HERO, RN Outcome: Adequate for Discharge 06/08/2024 1212 by Reesa Burnard HERO, RN Outcome: Progressing Goal: Respiratory complications will improve 06/08/2024 1212 by Reesa Burnard HERO, RN Outcome: Adequate for Discharge 06/08/2024 1212 by Reesa Burnard HERO, RN Outcome: Progressing Goal: Cardiovascular complication will be avoided 06/08/2024 1212 by Reesa Burnard HERO, RN Outcome: Adequate for Discharge 06/08/2024 1212 by Reesa Burnard HERO, RN Outcome: Progressing   Problem: Activity: Goal: Risk for activity intolerance will decrease 06/08/2024 1212 by Hogan-Nutting, Burnard HERO, RN Outcome: Adequate for Discharge 06/08/2024 1212 by Reesa Burnard HERO, RN Outcome: Progressing   Problem: Nutrition: Goal: Adequate nutrition will be maintained 06/08/2024 1212 by Reesa Burnard HERO,  RN Outcome: Adequate for Discharge 06/08/2024 1212 by Reesa Burnard HERO, RN Outcome: Progressing   Problem: Coping: Goal: Level of anxiety will decrease 06/08/2024 1212 by Reesa Burnard HERO, RN Outcome: Adequate for Discharge 06/08/2024 1212 by Reesa Burnard HERO, RN Outcome: Progressing   Problem: Elimination: Goal: Will not experience complications related to bowel motility 06/08/2024 1212 by Reesa Burnard HERO, RN Outcome: Adequate for Discharge 06/08/2024 1212 by Reesa Burnard HERO, RN Outcome: Progressing Goal: Will not experience complications related to urinary retention 06/08/2024 1212 by Reesa Burnard HERO, RN Outcome: Adequate for Discharge 06/08/2024 1212 by Reesa Burnard HERO, RN Outcome: Progressing   Problem: Pain Managment: Goal: General experience of comfort will improve and/or be controlled 06/08/2024 1212 by Reesa Burnard HERO, RN Outcome: Adequate for Discharge 06/08/2024 1212 by Reesa Burnard HERO, RN Outcome: Progressing   Problem: Safety: Goal: Ability to remain free from injury will improve 06/08/2024 1212 by Hogan-Nutting, Burnard HERO, RN Outcome: Adequate for Discharge 06/08/2024 1212 by Reesa Burnard HERO, RN Outcome: Progressing   Problem: Skin Integrity: Goal: Risk for impaired skin integrity will decrease 06/08/2024 1212 by Reesa Burnard HERO, RN Outcome: Adequate for Discharge 06/08/2024 1212 by Reesa Burnard HERO, RN Outcome: Progressing   Problem: Education: Goal: Ability to describe self-care measures that may prevent or decrease complications (Diabetes Survival Skills Education) will improve 06/08/2024 1212 by Reesa Burnard HERO, RN Outcome: Adequate for Discharge 06/08/2024 1212 by Reesa Burnard HERO, RN Outcome: Progressing Goal: Individualized Educational Video(s) 06/08/2024 1212 by Reesa Burnard HERO, RN Outcome: Adequate for Discharge 06/08/2024 1212 by Reesa Burnard HERO, RN Outcome:  Progressing   Problem: Coping: Goal: Ability to adjust to condition or change in health will improve 06/08/2024 1212 by Reesa Burnard HERO, RN Outcome: Adequate for Discharge 06/08/2024 1212 by Reesa Burnard HERO, RN Outcome: Progressing   Problem: Fluid Volume: Goal: Ability to maintain a balanced intake and output will improve 06/08/2024 1212 by Hogan-Nutting, Burnard HERO, RN Outcome: Adequate for Discharge  06/08/2024 1212 by Reesa Burnard HERO, RN Outcome: Progressing   Problem: Health Behavior/Discharge Planning: Goal: Ability to identify and utilize available resources and services will improve 06/08/2024 1212 by Hogan-Nutting, Burnard HERO, RN Outcome: Adequate for Discharge 06/08/2024 1212 by Reesa Burnard HERO, RN Outcome: Progressing Goal: Ability to manage health-related needs will improve 06/08/2024 1212 by Hogan-Nutting, Burnard HERO, RN Outcome: Adequate for Discharge 06/08/2024 1212 by Reesa Burnard HERO, RN Outcome: Progressing   Problem: Metabolic: Goal: Ability to maintain appropriate glucose levels will improve 06/08/2024 1212 by Reesa Burnard HERO, RN Outcome: Adequate for Discharge 06/08/2024 1212 by Reesa Burnard HERO, RN Outcome: Progressing   Problem: Nutritional: Goal: Maintenance of adequate nutrition will improve 06/08/2024 1212 by Reesa Burnard HERO, RN Outcome: Adequate for Discharge 06/08/2024 1212 by Reesa Burnard HERO, RN Outcome: Progressing Goal: Progress toward achieving an optimal weight will improve 06/08/2024 1212 by Reesa Burnard HERO, RN Outcome: Adequate for Discharge 06/08/2024 1212 by Reesa Burnard HERO, RN Outcome: Progressing   Problem: Skin Integrity: Goal: Risk for impaired skin integrity will decrease 06/08/2024 1212 by Reesa Burnard HERO, RN Outcome: Adequate for Discharge 06/08/2024 1212 by Reesa Burnard HERO, RN Outcome: Progressing   Problem: Tissue Perfusion: Goal: Adequacy of tissue perfusion will  improve 06/08/2024 1212 by Reesa Burnard HERO, RN Outcome: Adequate for Discharge 06/08/2024 1212 by Reesa Burnard HERO, RN Outcome: Progressing   Problem: Education: Goal: Knowledge of the prescribed therapeutic regimen will improve 06/08/2024 1212 by Reesa Burnard HERO, RN Outcome: Adequate for Discharge 06/08/2024 1212 by Reesa Burnard HERO, RN Outcome: Progressing   Problem: Bowel/Gastric: Goal: Gastrointestinal status for postoperative course will improve 06/08/2024 1212 by Hogan-Nutting, Burnard HERO, RN Outcome: Adequate for Discharge 06/08/2024 1212 by Reesa Burnard HERO, RN Outcome: Progressing   Problem: Cardiac: Goal: Ability to maintain an adequate cardiac output 06/08/2024 1212 by Reesa Burnard HERO, RN Outcome: Adequate for Discharge 06/08/2024 1212 by Reesa Burnard HERO, RN Outcome: Progressing Goal: Will show no evidence of cardiac arrhythmias 06/08/2024 1212 by Reesa Burnard HERO, RN Outcome: Adequate for Discharge 06/08/2024 1212 by Reesa Burnard HERO, RN Outcome: Progressing   Problem: Nutritional: Goal: Will attain and maintain optimal nutritional status 06/08/2024 1212 by Reesa Burnard HERO, RN Outcome: Adequate for Discharge 06/08/2024 1212 by Reesa Burnard HERO, RN Outcome: Progressing   Problem: Neurological: Goal: Will regain or maintain usual level of consciousness 06/08/2024 1212 by Reesa Burnard HERO, RN Outcome: Adequate for Discharge 06/08/2024 1212 by Reesa Burnard HERO, RN Outcome: Progressing   Problem: Clinical Measurements: Goal: Ability to maintain clinical measurements within normal limits 06/08/2024 1212 by Reesa Burnard HERO, RN Outcome: Adequate for Discharge 06/08/2024 1212 by Reesa Burnard HERO, RN Outcome: Progressing Goal: Postoperative complications will be avoided or minimized 06/08/2024 1212 by Reesa Burnard HERO, RN Outcome: Adequate for Discharge 06/08/2024 1212 by Reesa Burnard HERO,  RN Outcome: Progressing   Problem: Respiratory: Goal: Will regain and/or maintain adequate ventilation 06/08/2024 1212 by Reesa Burnard HERO, RN Outcome: Adequate for Discharge 06/08/2024 1212 by Reesa Burnard HERO, RN Outcome: Progressing Goal: Respiratory status will improve 06/08/2024 1212 by Reesa Burnard HERO, RN Outcome: Adequate for Discharge 06/08/2024 1212 by Reesa Burnard HERO, RN Outcome: Progressing   Problem: Skin Integrity: Goal: Demonstrates signs of wound healing without infection 06/08/2024 1212 by Reesa Burnard HERO, RN Outcome: Adequate for Discharge 06/08/2024 1212 by Reesa Burnard HERO, RN Outcome: Progressing   Problem: Urinary Elimination: Goal: Will remain free from infection 06/08/2024 1212 by Reesa Burnard HERO, RN Outcome: Adequate for Discharge 06/08/2024 1212  by Reesa Burnard HERO, RN Outcome: Progressing Goal: Ability to achieve and maintain adequate urine output 06/08/2024 1212 by Reesa Burnard HERO, RN Outcome: Adequate for Discharge 06/08/2024 1212 by Reesa Burnard HERO, RN Outcome: Progressing   "

## 2024-06-08 NOTE — Plan of Care (Signed)
" °  Problem: Education: Goal: Knowledge of General Education information will improve Description: Including pain rating scale, medication(s)/side effects and non-pharmacologic comfort measures Outcome: Progressing   Problem: Health Behavior/Discharge Planning: Goal: Ability to manage health-related needs will improve Outcome: Progressing   Problem: Clinical Measurements: Goal: Ability to maintain clinical measurements within normal limits will improve Outcome: Progressing Goal: Will remain free from infection Outcome: Progressing Goal: Diagnostic test results will improve Outcome: Progressing Goal: Respiratory complications will improve Outcome: Progressing Goal: Cardiovascular complication will be avoided Outcome: Progressing   Problem: Activity: Goal: Risk for activity intolerance will decrease Outcome: Progressing   Problem: Nutrition: Goal: Adequate nutrition will be maintained Outcome: Progressing   Problem: Coping: Goal: Level of anxiety will decrease Outcome: Progressing   Problem: Elimination: Goal: Will not experience complications related to bowel motility Outcome: Progressing Goal: Will not experience complications related to urinary retention Outcome: Progressing   Problem: Pain Managment: Goal: General experience of comfort will improve and/or be controlled Outcome: Progressing   Problem: Safety: Goal: Ability to remain free from injury will improve Outcome: Progressing   Problem: Skin Integrity: Goal: Risk for impaired skin integrity will decrease Outcome: Progressing   Problem: Education: Goal: Ability to describe self-care measures that may prevent or decrease complications (Diabetes Survival Skills Education) will improve Outcome: Progressing Goal: Individualized Educational Video(s) Outcome: Progressing   Problem: Coping: Goal: Ability to adjust to condition or change in health will improve Outcome: Progressing   Problem: Fluid  Volume: Goal: Ability to maintain a balanced intake and output will improve Outcome: Progressing   Problem: Health Behavior/Discharge Planning: Goal: Ability to identify and utilize available resources and services will improve Outcome: Progressing Goal: Ability to manage health-related needs will improve Outcome: Progressing   Problem: Metabolic: Goal: Ability to maintain appropriate glucose levels will improve Outcome: Progressing   Problem: Nutritional: Goal: Maintenance of adequate nutrition will improve Outcome: Progressing Goal: Progress toward achieving an optimal weight will improve Outcome: Progressing   Problem: Skin Integrity: Goal: Risk for impaired skin integrity will decrease Outcome: Progressing   Problem: Tissue Perfusion: Goal: Adequacy of tissue perfusion will improve Outcome: Progressing   Problem: Education: Goal: Knowledge of the prescribed therapeutic regimen will improve Outcome: Progressing   Problem: Bowel/Gastric: Goal: Gastrointestinal status for postoperative course will improve Outcome: Progressing   Problem: Cardiac: Goal: Ability to maintain an adequate cardiac output Outcome: Progressing Goal: Will show no evidence of cardiac arrhythmias Outcome: Progressing   Problem: Nutritional: Goal: Will attain and maintain optimal nutritional status Outcome: Progressing   Problem: Neurological: Goal: Will regain or maintain usual level of consciousness Outcome: Progressing   Problem: Clinical Measurements: Goal: Ability to maintain clinical measurements within normal limits Outcome: Progressing Goal: Postoperative complications will be avoided or minimized Outcome: Progressing   Problem: Respiratory: Goal: Will regain and/or maintain adequate ventilation Outcome: Progressing Goal: Respiratory status will improve Outcome: Progressing   Problem: Skin Integrity: Goal: Demonstrates signs of wound healing without infection Outcome:  Progressing   Problem: Urinary Elimination: Goal: Will remain free from infection Outcome: Progressing Goal: Ability to achieve and maintain adequate urine output Outcome: Progressing   "

## 2024-06-08 NOTE — Anesthesia Preprocedure Evaluation (Addendum)
"                                    Anesthesia Evaluation  Patient identified by MRN, date of birth, ID band Patient awake    Reviewed: Allergy & Precautions, NPO status , Patient's Chart, lab work & pertinent test results  History of Anesthesia Complications Negative for: history of anesthetic complications  Airway Mallampati: III  TM Distance: >3 FB Neck ROM: Full    Dental  (+) Dental Advisory Given, Teeth Intact   Pulmonary neg pulmonary ROS   Pulmonary exam normal        Cardiovascular hypertension, Pt. on medications Normal cardiovascular exam     Neuro/Psych  Neuromuscular disease  negative psych ROS   GI/Hepatic negative GI ROS, Neg liver ROS,,,  Endo/Other  diabetes, Type 2, Oral Hypoglycemic Agents   Obesity   Renal/GU  Ureteral stone      Musculoskeletal  (+) Arthritis ,    Abdominal   Peds  Hematology negative hematology ROS (+)   Anesthesia Other Findings   Reproductive/Obstetrics                              Anesthesia Physical Anesthesia Plan  ASA: 2  Anesthesia Plan: General   Post-op Pain Management: Tylenol  PO (pre-op)*   Induction: Intravenous  PONV Risk Score and Plan: 3 and Treatment may vary due to age or medical condition, Ondansetron  and Dexamethasone   Airway Management Planned: LMA  Additional Equipment: None  Intra-op Plan:   Post-operative Plan: Extubation in OR  Informed Consent: I have reviewed the patients History and Physical, chart, labs and discussed the procedure including the risks, benefits and alternatives for the proposed anesthesia with the patient or authorized representative who has indicated his/her understanding and acceptance.     Dental advisory given  Plan Discussed with: CRNA and Anesthesiologist  Anesthesia Plan Comments:          Anesthesia Quick Evaluation  "

## 2024-06-08 NOTE — Discharge Summary (Signed)
 " Physician Discharge Summary   Patient: Patricia Fuller MRN: [id number] DOB: Mar 28, 1951  Admit date:     06/07/2024  Discharge date: {dischdate:26783}  Discharge Physician: Concepcion Riser   PCP: Alphonsa Glendia LABOR, MD   Recommendations at discharge:  {Tip this will not be part of the note when signed- Example include specific recommendations for outpatient follow-up, pending tests to follow-up on. (Optional):26781}  PCP follow up in 1 week. Urology follow up as scheduled.  Discharge Diagnoses: Principal Problem:   Kidney stone Active Problems:   Essential hypertension, benign   Hyperlipidemia associated with type 2 diabetes mellitus (HCC)   Calculus of gallbladder without cholecystitis without obstruction   Diabetes mellitus without complication (HCC)  Resolved Problems:   * No resolved hospital problems. [facility] Course: No notes on file  Assessment and Plan: No notes have been filed under this hospital service. Service: Hospitalist     {Tip this will not be part of the note when signed Body mass index is 37.12 kg/m. , ,  (Optional):26781}  {(NOTE) Pain control PDMP Statment (Optional):26782} Consultants: *** Procedures performed: ***  Disposition: {Plan; Disposition:26390} Diet recommendation:  Discharge Diet Orders (From admission, onward)     Start     Ordered   06/08/24 0000  Diet Carb Modified        06/08/24 1102   06/08/24 0000  Diet - low sodium heart healthy        06/08/24 1102           {Diet_Plan:26776} DISCHARGE MEDICATION: Allergies as of 06/08/2024   No Known Allergies      Medication List     TAKE these medications    alendronate  70 MG tablet Commonly known as: FOSAMAX  TAKE 1 TABLET BY MOUTH EVERY 7 DAYS. TAKE WITH A FULL GLASS OF WATER ON AN EMPTY STOMACH What changed: See the new instructions.   cephALEXin  500 MG capsule Commonly known as: KEFLEX  Take 1 capsule (500 mg total) by mouth 2 (two) times daily. X 3 days to prevent  infection with tethered stent. Notes to patient: Take with food   cetirizine 10 MG tablet Commonly known as: ZYRTEC Take 10 mg by mouth in the morning.   ipratropium 0.06 % nasal spray Commonly known as: ATROVENT  Place 2 sprays into both nostrils 4 (four) times daily as needed for rhinitis.   ketorolac  10 MG tablet Commonly known as: TORADOL  Take 1 tablet (10 mg total) by mouth every 8 (eight) hours as needed for moderate pain (pain score 4-6). Or stent discomfort post-operatively.   losartan  50 MG tablet Commonly known as: COZAAR  TAKE 1 TABLET BY MOUTH IN THE MORNING   metFORMIN  500 MG tablet Commonly known as: GLUCOPHAGE  TAKE 1/2 (ONE-HALF) TABLET BY MOUTH TWICE DAILY WITH A MEAL What changed: See the new instructions.   oxyCODONE -acetaminophen  5-325 MG tablet Commonly known as: Percocet Take 1 tablet by mouth every 6 (six) hours as needed for severe pain (pain score 7-10) (post-operatively).   rosuvastatin  10 MG tablet Commonly known as: CRESTOR  Take 1 tablet by mouth once daily What changed: when to take this   senna-docusate 8.6-50 MG tablet Commonly known as: Senokot-S Take 1 tablet by mouth 2 (two) times daily. While taking strong pain meds to prevent constipation.        Follow-up Information     [facility] Follow up.   Why: Call to arrange office post-op visit within 3 weeks. Contact information: [address]  [place]  [address] [phone]               Discharge Exam: Patricia Fuller   06/07/24 1015  Weight: 104.3 kg   ***  Condition at discharge: {DC Condition:26389}  The results of significant diagnostics from this hospitalization (including imaging, microbiology, ancillary and laboratory) are listed below for reference.   Imaging Studies: DG C-Arm 1-60 Min-No Report Result Date: 06/08/2024 Fluoroscopy was utilized by the requesting physician.  No radiographic interpretation.   CT ABDOMEN PELVIS W  CONTRAST Result Date: 06/07/2024 EXAM: CT ABDOMEN AND PELVIS WITH CONTRAST 06/07/2024 11:49:12 AM TECHNIQUE: CT of the abdomen and pelvis was performed with the administration of 100 mL of iohexol  (OMNIPAQUE ) 300 MG/ML solution. Multiplanar reformatted images are provided for review. Automated exposure control, iterative reconstruction, and/or weight-based adjustment of the mA/kV was utilized to reduce the radiation dose to as low as reasonably achievable. COMPARISON: None available. CLINICAL HISTORY: Abdominal pain, acute (Ped 0-17y) FINDINGS: LOWER CHEST: Bibasilar atelectasis. LIVER: The liver is unremarkable. GALLBLADDER AND BILE DUCTS: Calcified gallstone measuring up to 2.8 cm within the neck of the gallbladder. No associated gallbladder wall thickening or pericholecystic fluid. No biliary ductal dilatation. SPLEEN: No acute abnormality. PANCREAS: No acute abnormality. ADRENAL GLANDS: No acute abnormality. KIDNEYS, URETERS AND BLADDER: 7 mm calcified stone within the proximal left ureter distal to the ureteropelvic junction with associated mild proximal hydroureter and at least mild proximal hydronephrosis. Associated urothelial thickening. No extravasation of intravenous contrast on delayed view from the left kidney. Delayed left nephrogram. No left nephrolithiasis. No right hydroureteronephrosis. No right nephroureterolithiasis. The right kidney enhances homogeneously. No filling defects of the partially visualized right collecting systems on delayed imaging. Bilateral simple parapelvic cysts. Simple renal cysts do not require additional follow-up unless clinically indicated due to signs/symptoms. No perinephric or periureteral stranding. Urinary bladder is unremarkable. GI AND BOWEL: Stomach demonstrates no acute abnormality. No small or large bowel thickening or dilatation. The appendix is unremarkable. Colonic diverticulosis. There is no bowel obstruction. PERITONEUM AND RETROPERITONEUM: No ascites. No  free air. VASCULATURE: Atherosclerotic plaque. Aorta is normal in caliber. LYMPH NODES: No lymphadenopathy. REPRODUCTIVE ORGANS: The uterus is unremarkable. No adnexal mass. BONES AND SOFT TISSUES: Chronic T12 compression fracture. No focal soft tissue abnormality. IMPRESSION: 1. Obstructive 7 mm calcified stone within the proximal left ureter with associated urothelial thickening which may represent superimposed infection. Correlate with urinalysis. 2. Calcified 2.8 cm gallstone within the gallbladder neck, without CT evidence of acute cholecystitis. 3. Colonic diverticulosis with no acute diverticulitis. Electronically signed by: Morgane Naveau MD 06/07/2024 12:48 PM EST RP Workstation: [id number]    Microbiology: Results for orders placed or performed during the hospital encounter of 07/24/23  Urine Culture     Status: Abnormal   Collection Time: 07/25/23 12:50 AM   Specimen: Urine, Clean Catch  Result Value Ref Range Status   Specimen Description   Final    URINE, CLEAN CATCH Performed at [facility], [address] [place], [place] [address]    Special Requests   Final    NONE Performed at [facility], [address] [place], [place] [address]    Culture 60,000 COLONIES/mL ESCHERICHIA COLI (A)  Final   Report Status 07/27/2023 FINAL  Final   Organism ID, Bacteria ESCHERICHIA COLI (A)  Final      Susceptibility   Escherichia coli - MIC*    AMPICILLIN >=32 RESISTANT Resistant     CEFAZOLIN <=4 SENSITIVE Sensitive     CEFEPIME <=  0.12 SENSITIVE Sensitive     CEFTRIAXONE  <=0.25 SENSITIVE Sensitive     CIPROFLOXACIN <=0.25 SENSITIVE Sensitive     GENTAMICIN >=16 RESISTANT Resistant     IMIPENEM <=0.25 SENSITIVE Sensitive     NITROFURANTOIN <=16 SENSITIVE Sensitive     TRIMETH/SULFA <=20 SENSITIVE Sensitive     AMPICILLIN/SULBACTAM 8 SENSITIVE Sensitive     PIP/TAZO <=4 SENSITIVE Sensitive ug/mL    * 60,000 COLONIES/mL ESCHERICHIA COLI    Labs: CBC: Recent Labs  Lab  06/07/24 1024  WBC 8.1  NEUTROABS 5.3  HGB 15.2*  HCT 47.4*  MCV 91.9  PLT 190   Basic Metabolic Panel: Recent Labs  Lab 06/07/24 1024  NA 140  K 4.3  CL 102  CO2 29  GLUCOSE 159*  BUN 20  CREATININE 0.99  CALCIUM  10.1   Liver Function Tests: Recent Labs  Lab 06/07/24 1024  AST 23  ALT 18  ALKPHOS 61  BILITOT 0.6  PROT 7.4  ALBUMIN  4.5   CBG: Recent Labs  Lab 06/07/24 1710 06/07/24 2115 06/08/24 0732  GLUCAP 130* 155* 127*    Discharge time spent: {LESS THAN/GREATER THAN:26388} 30 minutes.  Signed: Concepcion Riser, MD [facility] 06/08/2024 "

## 2024-06-08 NOTE — Anesthesia Postprocedure Evaluation (Signed)
"   Anesthesia Post Note  Patient: Ronal KANDICE Space  Procedure(s) Performed: CYSTOSCOPY/URETEROSCOPY/HOLMIUM LASER/STENT PLACEMENT (Left: Ureter)     Patient location during evaluation: PACU Anesthesia Type: General Level of consciousness: awake and alert Pain management: pain level controlled Vital Signs Assessment: post-procedure vital signs reviewed and stable Respiratory status: spontaneous breathing, nonlabored ventilation, respiratory function stable and patient connected to nasal cannula oxygen Cardiovascular status: stable and blood pressure returned to baseline Anesthetic complications: no   No notable events documented.  Last Vitals:  Vitals:   06/08/24 0945 06/08/24 0959  BP:    Pulse: 71 (!) 32  Resp: 13 12  Temp:    SpO2: 99% 98%    Last Pain:  Vitals:   06/08/24 0959  TempSrc:   PainSc: 0-No pain                 Debby FORBES Like      "

## 2024-06-08 NOTE — Plan of Care (Signed)
" °  Problem: Education: Goal: Knowledge of General Education information will improve Description: Including pain rating scale, medication(s)/side effects and non-pharmacologic comfort measures Outcome: Progressing   Problem: Health Behavior/Discharge Planning: Goal: Ability to manage health-related needs will improve Outcome: Progressing   Problem: Clinical Measurements: Goal: Ability to maintain clinical measurements within normal limits will improve Outcome: Progressing Goal: Will remain free from infection Outcome: Progressing Goal: Diagnostic test results will improve Outcome: Progressing Goal: Respiratory complications will improve Outcome: Progressing Goal: Cardiovascular complication will be avoided Outcome: Progressing   Problem: Elimination: Goal: Will not experience complications related to bowel motility Outcome: Progressing Goal: Will not experience complications related to urinary retention Outcome: Progressing   Problem: Coping: Goal: Level of anxiety will decrease Outcome: Progressing   Problem: Coping: Goal: Ability to adjust to condition or change in health will improve Outcome: Progressing   Problem: Fluid Volume: Goal: Ability to maintain a balanced intake and output will improve Outcome: Progressing   Problem: Tissue Perfusion: Goal: Adequacy of tissue perfusion will improve Outcome: Progressing   Problem: Skin Integrity: Goal: Risk for impaired skin integrity will decrease Outcome: Progressing   "

## 2024-06-08 NOTE — Progress Notes (Signed)
 * Day of Surgery *   Subjective/Chief Complaint:   1 - LEFT Ureteral Stone - 7mm left proximal sotne on ER CT 06/07/24. Stone is solitary with moderate hydro. Cr 0.99, UA without infections parameters. No fevers /. Leukocytosis.   Today [name] is seen to proceed with LEFT ureteroscoyp for solitary stone with refracotry colic. No interval fevers. NPO. No emesis.  Objective: Vital signs in last 24 hours: Temp:  [97.7 F (36.5 C)-98.7 F (37.1 C)] 98.6 F (37 C) (01/03 0815) Pulse Rate:  [66-98] 67 (01/03 0633) Resp:  [16-18] 17 (01/03 0815) BP: (128-182)/(63-81) 182/77 (01/03 0815) SpO2:  [91 %-98 %] 97 % (01/03 0815) Weight:  [104.3 kg] 104.3 kg (01/02 1015)    Intake/Output from previous day: 01/02 0701 - 01/03 0700 In: 345.8 [I.V.:345.8] Out: -  Intake/Output this shift: No intake/output data recorded.  NAD in holding area NLB-RA Stable truncal obesity Minimal left CVAT   Lab Results:  Recent Labs    06/07/24 1024  WBC 8.1  HGB 15.2*  HCT 47.4*  PLT 190   BMET Recent Labs    06/07/24 1024  NA 140  K 4.3  CL 102  CO2 29  GLUCOSE 159*  BUN 20  CREATININE 0.99  CALCIUM  10.1   PT/INR No results for input(s): LABPROT, INR in the last 72 hours. ABG No results for input(s): PHART, HCO3 in the last 72 hours.  Invalid input(s): PCO2, PO2  Studies/Results: CT ABDOMEN PELVIS W CONTRAST Result Date: 06/07/2024 EXAM: CT ABDOMEN AND PELVIS WITH CONTRAST 06/07/2024 11:49:12 AM TECHNIQUE: CT of the abdomen and pelvis was performed with the administration of 100 mL of iohexol  (OMNIPAQUE ) 300 MG/ML solution. Multiplanar reformatted images are provided for review. Automated exposure control, iterative reconstruction, and/or weight-based adjustment of the mA/kV was utilized to reduce the radiation dose to as low as reasonably achievable. COMPARISON: None available. CLINICAL HISTORY: Abdominal pain, acute (Ped 0-17y) FINDINGS: LOWER CHEST: Bibasilar atelectasis.  LIVER: The liver is unremarkable. GALLBLADDER AND BILE DUCTS: Calcified gallstone measuring up to 2.8 cm within the neck of the gallbladder. No associated gallbladder wall thickening or pericholecystic fluid. No biliary ductal dilatation. SPLEEN: No acute abnormality. PANCREAS: No acute abnormality. ADRENAL GLANDS: No acute abnormality. KIDNEYS, URETERS AND BLADDER: 7 mm calcified stone within the proximal left ureter distal to the ureteropelvic junction with associated mild proximal hydroureter and at least mild proximal hydronephrosis. Associated urothelial thickening. No extravasation of intravenous contrast on delayed view from the left kidney. Delayed left nephrogram. No left nephrolithiasis. No right hydroureteronephrosis. No right nephroureterolithiasis. The right kidney enhances homogeneously. No filling defects of the partially visualized right collecting systems on delayed imaging. Bilateral simple parapelvic cysts. Simple renal cysts do not require additional follow-up unless clinically indicated due to signs/symptoms. No perinephric or periureteral stranding. Urinary bladder is unremarkable. GI AND BOWEL: Stomach demonstrates no acute abnormality. No small or large bowel thickening or dilatation. The appendix is unremarkable. Colonic diverticulosis. There is no bowel obstruction. PERITONEUM AND RETROPERITONEUM: No ascites. No free air. VASCULATURE: Atherosclerotic plaque. Aorta is normal in caliber. LYMPH NODES: No lymphadenopathy. REPRODUCTIVE ORGANS: The uterus is unremarkable. No adnexal mass. BONES AND SOFT TISSUES: Chronic T12 compression fracture. No focal soft tissue abnormality. IMPRESSION: 1. Obstructive 7 mm calcified stone within the proximal left ureter with associated urothelial thickening which may represent superimposed infection. Correlate with urinalysis. 2. Calcified 2.8 cm gallstone within the gallbladder neck, without CT evidence of acute cholecystitis. 3. Colonic diverticulosis with  no acute diverticulitis.  Electronically signed by: Morgane Naveau MD 06/07/2024 12:48 PM EST RP Workstation: [id number]    Anti-infectives: Anti-infectives (From admission, onward)    Start     Dose/Rate Route Frequency Ordered Stop   06/07/24 1630  [MAR Hold]  cefTRIAXone  (ROCEPHIN ) 1 g in sodium chloride  0.9 % 100 mL IVPB        (MAR Hold since Sat 06/08/2024 at 0821.Hold Reason: Transfer to a Procedural area)   1 g 200 mL/hr over 30 Minutes Intravenous Every 24 hours 06/07/24 1628         Assessment/Plan:  Proceed as planned with LEFT ureteroscopic stone manipulation. Risks ,benefits, alternatives, expected peri-op course reinforced.    [name]. 06/08/2024

## 2024-06-08 NOTE — Care Management CC44 (Signed)
"         Condition Code 44 Documentation Completed  Patient Details  Name: Patricia Fuller MRN: [id number] Date of Birth: 1950-10-10   Condition Code 44 given:  Yes Patient signature on Condition Code 44 notice:  Yes Documentation of 2 MD's agreement:  Yes Code 44 added to claim:  Yes    Jon ONEIDA Anon, RN 06/08/2024, 11:34 AM  "

## 2024-06-08 NOTE — Brief Op Note (Signed)
 06/08/2024  9:29 AM  PATIENT:  Patricia Fuller  74 y.o. female  PRE-OPERATIVE DIAGNOSIS:  LEFT URETERAL STONE  POST-OPERATIVE DIAGNOSIS:  LEFT URETERAL STONE  PROCEDURE:  Procedures: CYSTOSCOPY/URETEROSCOPY/HOLMIUM LASER/STENT PLACEMENT (Left)  SURGEON:  Surgeons and Role:    * Manny, Ricardo KATHEE Raddle., MD - Primary  PHYSICIAN ASSISTANT:   ASSISTANTS: none   ANESTHESIA:   general  EBL:  minimal   BLOOD ADMINISTERED:none  DRAINS: none   LOCAL MEDICATIONS USED:  NONE  SPECIMEN:  Source of Specimen:  left ureteral stone fragments  DISPOSITION OF SPECIMEN:  [facility] for compositional analysis  COUNTS:  YES  TOURNIQUET:  * No tourniquets in log *  DICTATION: .Other Dictation: Dictation Number [id number]  PLAN OF CARE: Admit for overnight observation  PATIENT DISPOSITION:  PACU - hemodynamically stable.   Delay start of Pharmacological VTE agent (>24hrs) due to surgical blood loss or risk of bleeding: yes

## 2024-06-08 NOTE — Anesthesia Procedure Notes (Signed)
 Procedure Name: LMA Insertion Date/Time: 06/08/2024 8:48 AM  Performed by: Brandy Almarie BROCKS, CRNAPre-anesthesia Checklist: Patient identified, Emergency Drugs available, Suction available and Patient being monitored Patient Re-evaluated:Patient Re-evaluated prior to induction Oxygen Delivery Method: Circle system utilized Preoxygenation: Pre-oxygenation with 100% oxygen Induction Type: IV induction LMA: LMA inserted LMA Size: 4.0 Number of attempts: 1 Dental Injury: Teeth and Oropharynx as per pre-operative assessment

## 2024-06-08 NOTE — Discharge Instructions (Signed)
 1 - You may have urinary urgency (bladder spasms) and bloody urine on / off with stent in place. This is normal.  2 - Remove tethered stent on Monday morning at home by pulling string, then blue-white plastic tubing, and discarding. Office is open Monday if any issues arise.   3 -Call MD or go to ER for fever >102, severe pain / nausea / vomiting not relieved by medications, or acute change in medical status

## 2024-06-08 NOTE — Op Note (Unsigned)
 [name], Patricia Fuller MEDICAL RECORD NO: [id number] ACCOUNT NO: [id number] DATE OF BIRTH: August 17, 1950 FACILITY: [facility] LOCATION: [id number] PHYSICIAN: Ricardo Likens, MD  Operative Report   DATE OF PROCEDURE: 06/07/2024  PREOPERATIVE DIAGNOSIS:  Left ureteral stone with refractory colic.  PROCEDURE PERFORMED: 1. Status post left retrograde pyelogram interpretation. 2. Left ureteroscopy with laser lithotripsy. 3. Insertion of left ureteral stent.  ESTIMATED BLOOD LOSS:  Nil.  COMPLICATIONS:  None.  SPECIMENS:  Left ureteral stone fragments for composite analysis.  FINDINGS: 1. Left proximal ureteral stone with a mild hydronephrosis. 2. Complete resolution of all accessible stone fragments larger than 1/3rd mm following laser lithotripsy and basket extraction. 3.  Successful placement of left ureteral stent, proximal end in renal pelvis, distal end in urinary bladder with tether.  INDICATIONS:  The patient is a 74 year old lady with a remote history of prior urolithiasis.  She was found on workup for colicky flank pain to have a left proximal ureteral stone with mild hydronephrosis.  Her colic was very difficult to control, and  she was admitted overnight for observation.  However, her colic remained problematic.  Options discussed including continued medical therapy versus shockwave lithotripsy in the outpatient setting versus ureteroscopy today.  She wishes to proceed with  this with the goal of stone free, and informed consent was obtained and placed in the medical record.  DESCRIPTION OF PROCEDURE:  The patient is identified and verified, procedure being left ureteroscopic stone manipulation was confirmed.  Procedure timeout was performed, intravenous antibiotics were administered, and general anesthesia was introduced.   The patient was placed into a low lithotomy position.  A sterile field was created, prepped and draped the patient's vagina, introitus, and proximal thigh using iodine .   Cystourethroscopy was performed using a 21-French rigid cystoscope offset lens.   Inspection of the urinary bladder revealed no diverticula, calcifications, or papillary lesions.  The ureteral orifices were single.  The left ureteral orifice was cannulated with a 6-French end-hole catheter, and a left retrograde pyelogram was  obtained.  The left retrograde pyelogram demonstrated a single left ureter, single system left kidney.  There was a filling defect in the proximal ureter consistent with the known stone with mild hydronephrosis above this.  A 0.038 ZIPwire was advanced to the lower  pole and set aside as a safety wire.  An 8-French feeding tube was placed in the urinary bladder for pressure release.  Semirigid ureteroscopy was performed of the entire length of the left ureter alongside a separate sensory working wire.  At the upper  reaches of the semirigid scope, at the area of approximately the UPJ, the stone in question was encountered.  There was mild impaction.  It was retrogradely positioned to the renal pelvis area.  The semirigid scope was then exchanged over the sensory  working wire for a short-length ureteral access sheath to the level of the proximal ureter.  Flexible digital ureteroscopy was performed of the proximal ureter and systematic inspection of the left kidney.  The stone in question had been positioned into  a lower mid-calyx.  This was too large for simple basketing.  Holmium laser energy was applied to the stone using a setting of 0.2 joules and 20 Hz.  It was fragmented into approximately 3 smaller pieces which were then sequentially removed with the  escape basket and set aside for composite analysis.  Following this, there was complete resolution of all accessible stone fragments larger than 1/3rd mm.  The access sheath was removed under  direct continuous vision.  No significant mucosal  abnormalities were found.  Given access sheath usage, it was felt that interval  stenting with a tether stent would be most prudent, and a new 5 x 24 Polaris-type stent was carefully placed using fluoroscopic guidance.  Good proximal and distal plane was  noted.  The tether was left in place, trimmed to length, tucked to her vagina, and the procedure was terminated.  The patient tolerated the procedure well, with no immediate periprocedural complications.  The patient was taken to the postanesthesia care  unit in stable condition.  The plan is for likely discharge home later today.   PUS D: 06/08/2024 9:33:20 am T: 06/08/2024 2:14:00 pm  JOB: [id number]/ [id number]

## 2024-06-08 NOTE — Transfer of Care (Signed)
 Immediate Anesthesia Transfer of Care Note  Patient: Patricia Fuller  Procedure(s) Performed: CYSTOSCOPY/URETEROSCOPY/HOLMIUM LASER/STENT PLACEMENT (Left: Ureter)  Patient Location: PACU  Anesthesia Type:General  Level of Consciousness: drowsy  Airway & Oxygen Therapy: Patient Spontanous Breathing and Patient connected to face mask oxygen  Post-op Assessment: Report given to RN, Post -op Vital signs reviewed and stable, and Patient moving all extremities X 4  Post vital signs: Reviewed and stable  Last Vitals:  Vitals Value Taken Time  BP 122/71   Temp    Pulse 77 06/08/24 09:19  Resp 16   SpO2 97 % 06/08/24 09:19  Vitals shown include unfiled device data.  Last Pain:  Vitals:   06/08/24 0622  TempSrc: Oral  PainSc:          Complications: No notable events documented.

## 2024-06-09 ENCOUNTER — Encounter (HOSPITAL_COMMUNITY): Payer: Self-pay | Admitting: Urology

## 2024-06-09 ENCOUNTER — Ambulatory Visit: Payer: Self-pay | Admitting: Family Medicine

## 2024-06-09 LAB — URINE CULTURE

## 2024-06-17 ENCOUNTER — Encounter (INDEPENDENT_AMBULATORY_CARE_PROVIDER_SITE_OTHER): Payer: Self-pay | Admitting: *Deleted

## 2024-06-24 ENCOUNTER — Other Ambulatory Visit: Payer: Self-pay | Admitting: Family Medicine

## 2024-06-24 ENCOUNTER — Telehealth: Payer: Self-pay

## 2024-06-24 DIAGNOSIS — E1169 Type 2 diabetes mellitus with other specified complication: Secondary | ICD-10-CM

## 2024-06-24 DIAGNOSIS — Z79899 Other long term (current) drug therapy: Secondary | ICD-10-CM

## 2024-06-24 DIAGNOSIS — I10 Essential (primary) hypertension: Secondary | ICD-10-CM

## 2024-06-24 NOTE — Telephone Encounter (Signed)
 Copied from CRM [id number]. Topic: Clinical - Lab/Test Results >> Jun 24, 2024 10:58 AM Emylou G wrote: Reason for CRM: Patient would like labwork ordered before her appt Friday - said she goes to the [facility].. Pls call patient

## 2024-06-24 NOTE — Telephone Encounter (Signed)
 Nurses-her test was ordered through [facility] Ideally go in the morning time to skip breakfast but very important to drink plenty of water earlier that morning so that when she go she is well-hydrated They will be checking a blood test as well as urine test Please let patient know that I was able to see all of the tests that were ordered while she was at the hospital earlier this year-therefore we are avoiding duplicates We look forward to seeing her at the end of the week thanks

## 2024-06-24 NOTE — Telephone Encounter (Signed)
 Patient has been made aware per provider notes and recommendations

## 2024-06-27 ENCOUNTER — Ambulatory Visit: Payer: Self-pay | Admitting: Family Medicine

## 2024-06-27 LAB — MICROALBUMIN / CREATININE URINE RATIO
Creatinine, Urine: 212.9 mg/dL
Microalb/Creat Ratio: 15 mg/g{creat} (ref 0–29)
Microalbumin, Urine: 32.1 ug/mL

## 2024-06-27 LAB — BASIC METABOLIC PANEL WITH GFR
BUN/Creatinine Ratio: 20 (ref 12–28)
BUN: 18 mg/dL (ref 8–27)
CO2: 23 mmol/L (ref 20–29)
Calcium: 9.8 mg/dL (ref 8.7–10.3)
Chloride: 107 mmol/L — ABNORMAL HIGH (ref 96–106)
Creatinine, Ser: 0.92 mg/dL (ref 0.57–1.00)
Glucose: 141 mg/dL — ABNORMAL HIGH (ref 70–99)
Potassium: 4.5 mmol/L (ref 3.5–5.2)
Sodium: 143 mmol/L (ref 134–144)
eGFR: 67 mL/min/1.73

## 2024-06-27 LAB — LIPID PANEL
Chol/HDL Ratio: 2.7 ratio (ref 0.0–4.4)
Cholesterol, Total: 134 mg/dL (ref 100–199)
HDL: 49 mg/dL
LDL Chol Calc (NIH): 50 mg/dL (ref 0–99)
Triglycerides: 218 mg/dL — ABNORMAL HIGH (ref 0–149)
VLDL Cholesterol Cal: 35 mg/dL (ref 5–40)

## 2024-06-28 ENCOUNTER — Ambulatory Visit: Admitting: Family Medicine

## 2024-06-28 VITALS — BP 126/82 | Temp 97.2°F | Ht 66.0 in | Wt 226.1 lb

## 2024-06-28 DIAGNOSIS — E785 Hyperlipidemia, unspecified: Secondary | ICD-10-CM

## 2024-06-28 DIAGNOSIS — Z79899 Other long term (current) drug therapy: Secondary | ICD-10-CM | POA: Diagnosis not present

## 2024-06-28 DIAGNOSIS — E119 Type 2 diabetes mellitus without complications: Secondary | ICD-10-CM

## 2024-06-28 DIAGNOSIS — I10 Essential (primary) hypertension: Secondary | ICD-10-CM | POA: Diagnosis not present

## 2024-06-28 DIAGNOSIS — E1169 Type 2 diabetes mellitus with other specified complication: Secondary | ICD-10-CM | POA: Diagnosis not present

## 2024-06-28 MED ORDER — LOSARTAN POTASSIUM 50 MG PO TABS: 50.0000 mg | ORAL_TABLET | Freq: Every morning | ORAL | 3 refills | Status: AC

## 2024-06-28 MED ORDER — METFORMIN HCL 500 MG PO TABS: ORAL_TABLET | ORAL | 1 refills | Status: AC

## 2024-06-28 MED ORDER — ROSUVASTATIN CALCIUM 10 MG PO TABS: 10.0000 mg | ORAL_TABLET | Freq: Every day | ORAL | 3 refills | Status: AC

## 2024-06-28 MED ORDER — METFORMIN HCL 500 MG PO TABS: ORAL_TABLET | ORAL | 1 refills | Status: DC

## 2024-06-28 NOTE — Progress Notes (Signed)
" ° °  Subjective:    Patient ID: Patricia Fuller, female    DOB: Sep 11, 1950, 74 y.o.   MRN: [id number]  HPI Patient is here for a 6 month follow up visit  Patient with diabetes Doing a good job taking her medicine Try to watch her diet Try to stay physically active Overall energy level doing okay Sleeps fairly well Does suffer with morbid obesity associated with diabetes  The patient was seen today as part of a comprehensive diabetic check up. Patient has diabetes Patient relates good compliance with taking the medication. We discussed their diet and exercise activities  We also discussed the importance of notifying us  if any excessively high glucoses or low sugars.    Patient here for follow-up regarding cholesterol.    Patient relates taking medication on a regular basis Denies problems with medication Importance of dietary measures discussed Regular lab work regarding lipid and liver was checked and if needing additional labs was appropriately ordered  Results for orders placed or performed in visit on 06/24/24  Basic metabolic panel with GFR   Collection Time: 06/26/24  8:44 AM  Result Value Ref Range   Glucose 141 (H) 70 - 99 mg/dL   BUN 18 8 - 27 mg/dL   Creatinine, Ser 9.07 0.57 - 1.00 mg/dL   eGFR 67 >40 fO/fpw/8.26   BUN/Creatinine Ratio 20 12 - 28   Sodium 143 134 - 144 mmol/L   Potassium 4.5 3.5 - 5.2 mmol/L   Chloride 107 (H) 96 - 106 mmol/L   CO2 23 20 - 29 mmol/L   Calcium  9.8 8.7 - 10.3 mg/dL  Lipid panel   Collection Time: 06/26/24  8:44 AM  Result Value Ref Range   Cholesterol, Total 134 100 - 199 mg/dL   Triglycerides 781 (H) 0 - 149 mg/dL   HDL 49 >60 mg/dL   VLDL Cholesterol Cal 35 5 - 40 mg/dL   LDL Chol Calc (NIH) 50 0 - 99 mg/dL   Chol/HDL Ratio 2.7 0.0 - 4.4 ratio  Microalbumin/Creatinine Ratio, Urine   Collection Time: 06/26/24  8:44 AM  Result Value Ref Range   Creatinine, Urine 212.9 Not Estab. mg/dL   Microalbumin, Urine 67.8 Not Estab.  ug/mL   Microalb/Creat Ratio 15 0 - 29 mg/g creat   A1c was 7.1 in the hospital Patient did not state any concerns   Review of Systems     Objective:   Physical Exam  General-in no acute distress Eyes-no discharge Lungs-respiratory rate normal, CTA CV-no murmurs,RRR Extremities skin warm dry no edema Neuro grossly normal Behavior normal, alert       Assessment & Plan:   1. Hyperlipidemia associated with type 2 diabetes mellitus (HCC) (Primary) Cholesterol profile overall favorable continue medication healthy diet regular activity  2. Essential hypertension, benign Healthy diet avoid salt as best as possible take medic  3. High risk medication use Periodic labs  4. Diabetes mellitus without complication (HCC) A1c reasonable at 7.1 healthy diet regular activity recommended continue medication We did discuss GLP-1's currently patient does not want to do any type of medicine next shot I did give her the name of Rybelsus she can check with her insurance company and see if the cost is favorable but Rybelsus would not necessarily give her weight reduction with diabetes control for now she chooses to stay on the current meds healthy diet  5. Morbid obesity (HCC) Associated with diabetes hyperlipidemia healthy diet portion control recommended  "

## 2024-08-23 ENCOUNTER — Ambulatory Visit

## 2024-10-16 ENCOUNTER — Other Ambulatory Visit (HOSPITAL_COMMUNITY)

## 2024-10-16 ENCOUNTER — Other Ambulatory Visit

## 2024-10-24 ENCOUNTER — Ambulatory Visit: Admitting: Oncology

## 2024-12-26 ENCOUNTER — Ambulatory Visit: Admitting: Family Medicine
